# Patient Record
Sex: Male | Born: 1955 | Race: White | Hispanic: No | Marital: Married | Smoking: Never smoker
Health system: Southern US, Community
[De-identification: ages and names within clinical notes are randomized; demographics above are authoritative.]

## PROBLEM LIST (undated history)

## (undated) DIAGNOSIS — T4145XA Adverse effect of unspecified anesthetic, initial encounter: Secondary | ICD-10-CM

## (undated) DIAGNOSIS — T8859XA Other complications of anesthesia, initial encounter: Secondary | ICD-10-CM

## (undated) DIAGNOSIS — I639 Cerebral infarction, unspecified: Secondary | ICD-10-CM

## (undated) DIAGNOSIS — Z87442 Personal history of urinary calculi: Secondary | ICD-10-CM

## (undated) DIAGNOSIS — I10 Essential (primary) hypertension: Secondary | ICD-10-CM

## (undated) DIAGNOSIS — K219 Gastro-esophageal reflux disease without esophagitis: Secondary | ICD-10-CM

## (undated) DIAGNOSIS — Z8719 Personal history of other diseases of the digestive system: Secondary | ICD-10-CM

## (undated) DIAGNOSIS — I6521 Occlusion and stenosis of right carotid artery: Secondary | ICD-10-CM

## (undated) DIAGNOSIS — E785 Hyperlipidemia, unspecified: Secondary | ICD-10-CM

## (undated) DIAGNOSIS — E669 Obesity, unspecified: Secondary | ICD-10-CM

## (undated) HISTORY — DX: Obstructive sleep apnea (adult) (pediatric): G47.33

## (undated) HISTORY — PX: HEMORRHOID SURGERY: SHX153

## (undated) HISTORY — DX: Essential (primary) hypertension: I10

## (undated) HISTORY — PX: COLONOSCOPY: SHX174

## (undated) HISTORY — PX: TONSILLECTOMY: SUR1361

## (undated) HISTORY — DX: Cerebral infarction, unspecified: I63.9

## (undated) HISTORY — DX: Hyperlipidemia, unspecified: E78.5

## (undated) HISTORY — DX: Unspecified atrial fibrillation: I48.91

## (undated) HISTORY — DX: Obesity, unspecified: E66.9

---
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
## 1998-12-27 DIAGNOSIS — I639 Cerebral infarction, unspecified: Secondary | ICD-10-CM

## 1998-12-27 HISTORY — DX: Cerebral infarction, unspecified: I63.9

## 2013-11-14 DIAGNOSIS — R319 Hematuria, unspecified: Secondary | ICD-10-CM | POA: Insufficient documentation

## 2013-12-04 DIAGNOSIS — R399 Unspecified symptoms and signs involving the genitourinary system: Secondary | ICD-10-CM | POA: Insufficient documentation

## 2013-12-04 DIAGNOSIS — N400 Enlarged prostate without lower urinary tract symptoms: Secondary | ICD-10-CM | POA: Insufficient documentation

## 2013-12-04 DIAGNOSIS — N139 Obstructive and reflux uropathy, unspecified: Secondary | ICD-10-CM | POA: Insufficient documentation

## 2014-01-30 ENCOUNTER — Other Ambulatory Visit (HOSPITAL_COMMUNITY): Payer: Self-pay | Admitting: Urology

## 2014-01-30 DIAGNOSIS — N402 Nodular prostate without lower urinary tract symptoms: Secondary | ICD-10-CM

## 2014-02-08 ENCOUNTER — Ambulatory Visit (HOSPITAL_COMMUNITY): Payer: Self-pay

## 2014-09-26 DIAGNOSIS — I10 Essential (primary) hypertension: Secondary | ICD-10-CM | POA: Insufficient documentation

## 2014-11-27 DIAGNOSIS — K2270 Barrett's esophagus without dysplasia: Secondary | ICD-10-CM | POA: Insufficient documentation

## 2015-03-17 ENCOUNTER — Other Ambulatory Visit: Payer: Self-pay | Admitting: Surgery

## 2015-04-07 ENCOUNTER — Telehealth: Payer: Self-pay | Admitting: Cardiovascular Disease

## 2015-04-07 NOTE — Telephone Encounter (Signed)
Records rec [facility] gave to Chart Prep team for 4/20 apt.

## 2015-04-14 ENCOUNTER — Encounter: Payer: Self-pay | Admitting: *Deleted

## 2015-04-15 ENCOUNTER — Encounter: Payer: Self-pay | Admitting: *Deleted

## 2015-04-16 ENCOUNTER — Ambulatory Visit (INDEPENDENT_AMBULATORY_CARE_PROVIDER_SITE_OTHER): Payer: BC Managed Care – PPO | Admitting: Cardiovascular Disease

## 2015-04-16 ENCOUNTER — Encounter: Payer: Self-pay | Admitting: Cardiovascular Disease

## 2015-04-16 VITALS — BP 120/86 | HR 80 | Ht 68.0 in | Wt 210.0 lb

## 2015-04-16 DIAGNOSIS — Z79899 Other long term (current) drug therapy: Secondary | ICD-10-CM | POA: Diagnosis not present

## 2015-04-16 DIAGNOSIS — I482 Chronic atrial fibrillation, unspecified: Secondary | ICD-10-CM

## 2015-04-16 DIAGNOSIS — I639 Cerebral infarction, unspecified: Secondary | ICD-10-CM | POA: Diagnosis not present

## 2015-04-16 MED ORDER — FLECAINIDE ACETATE 50 MG PO TABS: 50.0000 mg | ORAL_TABLET | Freq: Two times a day (BID) | ORAL | Status: DC

## 2015-04-16 NOTE — Progress Notes (Signed)
Cardiology Office Note   Date:  04/16/2015   ID:  Tanner Rich, DOB [birth year], MRN [id number]  PCP:  No primary care provider on file.  Cardiologist:   Charlton HawsPeter Ayleen Mckinstry, MD   No chief complaint on file.     History of Present Illness: Tanner Rich is a 59 y.o. male who presents for evaluation of afib.  History of RICA occlusion and CVA in 2000.  11/2014 noted to have afib.  Started on eliquis and metoprolol  Echo ? Normal with diastolic dysfunction Notes don't indicate size of atria.  Cardioverted in January but reverted in 2-3 days to afib.  Notes indicate would consider AAD.  Also history of OSA HTN, elevated lipids and obesity.  He has documented sleep apnea over 8 years ago but not wearing CPAP.  Denies ETOH.  Feels very fatigued and dyspnic while in afib.    Was not taking eliquis correctly.  Had hemmoroidal bleed when on 10 bid  Still needs GI f/u no colonoscopy done but has had more than one normal one in [place] in last 5 years  No chest pain no history of CAD  Has not had a recent stress test     Past Medical History  Diagnosis Date  . HTN (hypertension)   . HLD (hyperlipidemia)   . OSA (obstructive sleep apnea)   . CVA (cerebral vascular accident)   . A-fib   . Obesity     No past surgical history on file.   Current Outpatient Prescriptions  Medication Sig Dispense Refill  . apixaban (ELIQUIS) 5 MG TABS tablet Take 5 mg by mouth daily.    . enalapril (VASOTEC) 20 MG tablet Take 20 mg by mouth daily.    . metoprolol (LOPRESSOR) 100 MG tablet Take 100 mg by mouth 2 (two) times daily.    . pravastatin (PRAVACHOL) 20 MG tablet Take 20 mg by mouth daily.    Marland Kitchen. PRILOSEC OTC 20 MG tablet Take 20 mg by mouth daily.    . sildenafil (REVATIO) 20 MG tablet Take 20 mg by mouth as needed. FOR SEXUAL ACTIVITY 2-5 TABLETS    . Vitamin D, Ergocalciferol, (DRISDOL) 50000 UNITS CAPS capsule Take 50,000 Units by mouth once a week.     No current facility-administered  medications for this visit.    Allergies:   Review of patient's allergies indicates no known allergies.    Social History:  The patient  reports that he has never smoked. He does not have any smokeless tobacco history on file. He reports that he does not drink alcohol or use illicit drugs.   Family History:  The patient's family history includes Atrial fibrillation in his mother; Cancer in his father; Heart disease in his father; Prostate cancer in an other family member.    ROS:  Please see the history of present illness.   Otherwise, review of systems are positive for none.   All other systems are reviewed and negative.    PHYSICAL EXAM: VS:  BP 120/86 mmHg  Pulse 80  Ht 5\' 8"  (1.727 m)  Wt 210 lb (95.255 kg)  BMI 31.94 kg/m2 , BMI Body mass index is 31.94 kg/(m^2). GEN: Well nourished, well developed, in no acute distress HEENT: normal Neck: no JVD, carotid bruits, or masses Cardiac:  RRR; no murmurs, rubs, or gallops,no edema  Respiratory:  clear to auscultation bilaterally, normal work of breathing GI: soft, nontender, nondistended, + BS MS: no deformity or atrophy Skin: warm and  dry, no rash Neuro:  Strength and sensation are intact Psych: euthymic mood, full affect   EKG:  Afib nonspecifc inferior ST changes QRS 92  QT 384/442  Rate 80     Recent Labs: No results found for requested labs within last 365 days.    Lipid Panel No results found for: CHOL, TRIG, HDL, CHOLHDL, VLDL, LDLCALC, LDLDIRECT    Wt Readings from Last 3 Encounters:  04/16/15 210 lb (95.255 kg)      Other studies Reviewed: Additional studies/ records that were reviewed today include: Over 50 pages of [facility]  See HPO.    ASSESSMENT AND PLAN:  1.  Afib:  Long discussion with patient and wife.  Clearly rate control and anticoagulation not best option as he still feels poorly.  Discussed appropriate dose of eliquis  Scary if he was taking 10 bid.  After bleed only been on 5 mg  daily so we are starting from scratch with 3 weeks of anticoagulaiton.  Rate control is fine  Failed [facility] needs AAD.  EF normal by echo at [facility]  Baseline ECG ok  Start flecainide 50 bid.  Pro arrythmia and need for stress testing discussed.  Willing to start.  Given nonspecific T wave changes will need myovue to r/o CAD and will assess QRS widening after 2 weeks of flecainide.   2. OSA  Discussed connection of PAF with OSA.  Prefer to do repeat [facility] on AAD after he has been on CPAP.  Referred to pulmonary and will likely need sleep study updated as has not been done in over 10 years 3. Chol:  Continue statin given history of stoke  F/u labs primary 4. CVA:  2000 lytics and indicates artery opened ? Thrombotic ICA occlusion f/u caroitd duplex  ASA   Current medicines are reviewed at length with the patient today.  The patient does not have concerns regarding medicines.  The following changes have been made:  Eliquis correct dose 5 bid  And flecainide 5 bid  Labs/ tests ordered today include:  CPAP and sleep referral for OSA  And lexiscan myovue and carotid duplex  No orders of the defined types were placed in this encounter.     Disposition:   FU with me next available       Signed, Charlton Haws, MD  04/16/2015 4:22 PM    [facility] [address] [place], [place], [place]  [address] Phone: [phone]; Fax: [phone]

## 2015-04-16 NOTE — Patient Instructions (Signed)
Medication Instructions:  START  FLECAINIDE   50 MG  1 TAB TWICE  DAILY   Labwork: NONE  Testing/Procedures: Your physician has requested that you have en exercise stress myoview. For further information please visit [email or web address]. Please follow instruction sheet, as given. IN 7 -10 DAYS   Your physician has requested that you have a carotid duplex. This test is an ultrasound of the carotid arteries in your neck. It looks at blood flow through these arteries that supply the brain with blood. Allow one hour for this exam. There are no restrictions or special instructions.   Your physician has recommended that you have a sleep study. This test records several body functions during sleep, including: brain activity, eye movement, oxygen and carbon dioxide blood levels, heart rate and rhythm, breathing rate and rhythm, the flow of air through your mouth and nose, snoring, body muscle movements, and chest and belly movement.  Follow-Up: Your physician recommends that you schedule a follow-up appointment in:  FIRST OF  June  PER  DR Eden EmmsNISHAN   Any Other Special Instructions Will Be Listed Below (If Applicable).

## 2015-04-23 ENCOUNTER — Ambulatory Visit (HOSPITAL_BASED_OUTPATIENT_CLINIC_OR_DEPARTMENT_OTHER): Payer: BC Managed Care – PPO | Admitting: Radiology

## 2015-04-23 ENCOUNTER — Ambulatory Visit (HOSPITAL_COMMUNITY): Payer: BC Managed Care – PPO | Attending: Cardiology | Admitting: *Deleted

## 2015-04-23 DIAGNOSIS — I482 Chronic atrial fibrillation, unspecified: Secondary | ICD-10-CM

## 2015-04-23 DIAGNOSIS — I639 Cerebral infarction, unspecified: Secondary | ICD-10-CM | POA: Diagnosis not present

## 2015-04-23 DIAGNOSIS — R9431 Abnormal electrocardiogram [ECG] [EKG]: Secondary | ICD-10-CM | POA: Insufficient documentation

## 2015-04-23 DIAGNOSIS — R0602 Shortness of breath: Secondary | ICD-10-CM | POA: Diagnosis not present

## 2015-04-23 DIAGNOSIS — R5383 Other fatigue: Secondary | ICD-10-CM | POA: Insufficient documentation

## 2015-04-23 DIAGNOSIS — Z79899 Other long term (current) drug therapy: Secondary | ICD-10-CM

## 2015-04-23 DIAGNOSIS — I6521 Occlusion and stenosis of right carotid artery: Secondary | ICD-10-CM

## 2015-04-23 DIAGNOSIS — I10 Essential (primary) hypertension: Secondary | ICD-10-CM | POA: Insufficient documentation

## 2015-04-23 MED ORDER — TECHNETIUM TC 99M SESTAMIBI GENERIC - CARDIOLITE
11.0000 | Freq: Once | INTRAVENOUS | Status: AC | PRN
  Administered 2015-04-23: 11 via INTRAVENOUS

## 2015-04-23 MED ORDER — TECHNETIUM TC 99M SESTAMIBI GENERIC - CARDIOLITE
33.0000 | Freq: Once | INTRAVENOUS | Status: AC | PRN
  Administered 2015-04-23: 33 via INTRAVENOUS

## 2015-04-23 NOTE — Progress Notes (Signed)
Carotid Duplex Exam Performed 

## 2015-04-23 NOTE — Progress Notes (Signed)
[facility] SITE 3 NUCLEAR MED [address] [place], [place] [address] [phone]    Cardiology Nuclear Med Study  Tanner Rich is a 59 y.o. male     MRN : [id number]     DOB: 09-Mar-1956  Procedure Date: 04/23/2015  Nuclear Med Background Indication for Stress Test:  Evaluation for Ischemia, Abnormal EKG, and assess for QRS widening after recent starting Flecainide History:  No known CAD, Atrial Fibrillation Cardiac Risk Factors: Carotid Disease and Hypertension  Symptoms:  Fatigue and SOB   Nuclear Pre-Procedure Caffeine/Decaff Intake:  6:00am decaffeinated coffee NPO After: 6:00am   Lungs:  clear O2 Sat: 93% on room air. IV 0.9% NS with Angio Cath:  22g  IV Site: R Hand x 1, tolerated well IV Started by:  Irean HongPatsy Kenneth Cuaresma, RN  Chest Size (in):  44 Cup Size: n/a  Height: 5\' 8"  (1.727 m)  Weight:  206 lb (93.441 kg)  BMI:  Body mass index is 31.33 kg/(m^2). Tech Comments:  Patient took Metoprolol last night, and Flecainide this am.Rea Reser, Charity fundraiserN.    Nuclear Med Study 1 or 2 day study: 1 day  Stress Test Type:  Stress  Reading MD: N/A  Order Authorizing Provider:  Charlton HawsPeter Nishan, MD  Resting Radionuclide: Technetium 1038m Sestamibi  Resting Radionuclide Dose: 11.0 mCi   Stress Radionuclide:  Technetium 3638m Sestamibi  Stress Radionuclide Dose: 33.0 mCi           Stress Protocol Rest HR: 62 Stress HR: 150  Rest BP: 129/103 Stress BP: 144/85  Exercise Time (min): 6:00 METS: 7.0   Predicted Max HR: 159 bpm % Max HR: 94.34 bpm Rate Pressure Product: [id number]   Dose of Adenosine (mg):  n/a Dose of Lexiscan: n/a mg  Dose of Atropine (mg): n/a Dose of Dobutamine: n/a mcg/kg/min (at max HR)  Stress Test Technologist: Nelson ChimesSharon Brooks, BS-ES  Nuclear Technologist:  Kerby NoraElzbieta Kubak, CNMT     Rest Procedure:  Myocardial perfusion imaging was performed at rest 45 minutes following the intravenous administration of Technetium 6438m Sestamibi. Rest ECG: Atrial fibrillation.  Incomplete right bundle branch block.  Stress Procedure:  The patient exercised on the treadmill utilizing the Bruce Protocol for 6:00 minutes. The patient stopped due to fatigue and denied any chest pain.  Technetium 1738m Sestamibi was injected at peak exercise and myocardial perfusion imaging was performed after a brief delay. Stress ECG: No significant change from baseline ECG  QPS Raw Data Images:  Normal; no motion artifact; normal heart/lung ratio. Stress Images:  Normal homogeneous uptake in all areas of the myocardium. Rest Images:  Normal homogeneous uptake in all areas of the myocardium. Subtraction (SDS):  No evidence of ischemia. Transient Ischemic Dilatation (Normal <1.22):  0.92 Lung/Heart Ratio (Normal <0.45):  0.30  Quantitative Gated Spect Images QGS EDV:  119 ml QGS ESV:  48 ml  Impression Exercise Capacity:  Fair exercise capacity. BP Response:  Normal blood pressure response. Clinical Symptoms:  No significant symptoms noted. ECG Impression:  No significant ST segment change suggestive of ischemia. Comparison with Prior Nuclear Study: No images to compare  Overall Impression:  Normal stress nuclear study. There is no scar or ischemia. This is a low risk scan.  LV Ejection Fraction: 60%.  LV Wall Motion:  Normal Wall Motion   Willa RoughJeffrey Katz, MD

## 2015-04-29 ENCOUNTER — Ambulatory Visit (HOSPITAL_BASED_OUTPATIENT_CLINIC_OR_DEPARTMENT_OTHER): Payer: BC Managed Care – PPO | Attending: Cardiovascular Disease | Admitting: Radiology

## 2015-04-29 VITALS — Ht 68.0 in | Wt 210.0 lb

## 2015-04-29 DIAGNOSIS — G4733 Obstructive sleep apnea (adult) (pediatric): Secondary | ICD-10-CM

## 2015-04-29 DIAGNOSIS — I4891 Unspecified atrial fibrillation: Secondary | ICD-10-CM | POA: Diagnosis not present

## 2015-04-29 DIAGNOSIS — I482 Chronic atrial fibrillation, unspecified: Secondary | ICD-10-CM

## 2015-04-29 DIAGNOSIS — R0683 Snoring: Secondary | ICD-10-CM | POA: Insufficient documentation

## 2015-04-29 DIAGNOSIS — I493 Ventricular premature depolarization: Secondary | ICD-10-CM | POA: Diagnosis not present

## 2015-05-07 ENCOUNTER — Encounter (HOSPITAL_BASED_OUTPATIENT_CLINIC_OR_DEPARTMENT_OTHER): Payer: Self-pay | Admitting: Radiology

## 2015-05-07 ENCOUNTER — Telehealth: Payer: Self-pay | Admitting: Cardiology

## 2015-05-07 DIAGNOSIS — G4733 Obstructive sleep apnea (adult) (pediatric): Secondary | ICD-10-CM

## 2015-05-07 HISTORY — DX: Obstructive sleep apnea (adult) (pediatric): G47.33

## 2015-05-07 NOTE — Sleep Study (Addendum)
   NAME: Tanner BeardJames W Hermiz DATE OF BIRTH:  Feb 06, 1956 MEDICAL RECORD NUMBER [id number]  LOCATION: [facility]  PHYSICIAN: Koty Anctil R  DATE OF STUDY: 04/29/2015  SLEEP STUDY TYPE: Nocturnal Polysomnogram               REFERRING PHYSICIAN: Wendall StadeNishan, Peter C, MD  INDICATION FOR STUDY: Loud snoring   EPWORTH SLEEPINESS SCORE: 2 HEIGHT: 5\' 8"  (172.7 cm)  WEIGHT: 210 lb (95.255 kg)    Body mass index is 31.94 kg/(m^2).  NECK SIZE: 16.5 in.  MEDICATIONS: Reviewed in the chart  SLEEP ARCHITECTURE: The patient slept for a total of 325 minutes our of a total of 386 minutes.  There was 0.5 minutes of slow wave sleep and 29 minutes of REM sleep.  The onset to sleep latency was 5 minutes and the onset to REM sleep latency was prolonged at 126 minutes.  The sleep efficiency was reduced at 83%.    RESPIRATORY DATA: The patient had a total of 6 apneas, of which, 5 were obstructive and 1 was mixed.  There were 76 hypopneas.  The total AHI was 15.1 events per hour.  This is consistent with moderate obstructive sleep apnea/hypopnea syndrome.  Most events occurred NREM sleep in the non supine position.   There was mild to moderate snoring noted.  OXYGEN DATA: The average oxygen saturation was 94%.  The lowest oxygen saturation was 81%.  The time spent with oxygen saturations <88% was 5 minutes.    CARDIAC DATA: The patient maintained atrial fibrillation throughout the study with PVC's.  The average heart rate was 70 bpm and the lowest heart rate was 35 bpm.  The maximum heart rate was 148 bpm.    MOVEMENT/PARASOMNIA: There were an increased number of periodic limb movements with a PLMS index of 11.6 per hour.  There were no REM sleep behavior disorders.  IMPRESSION/ RECOMMENDATION:   1.   Mild to moderate obstructive sleep apnea/hypopnea syndrome.  Most events occurred NREM sleep in the non supine position. 2.  Reduced sleep efficiency with increased frequency of arousals due to  respiratory and spontaneous events. 3.  Mild to moderate snoring was noted. 4.  Oxygen desaturations as low as 81% were noted with respiratory events.  The time spent with oxygen saturations <88% was 5 minutes.   5.  Abnormal sleep architecture with reduced slow wave and REM sleep. 6.  Periodic limb movements were noted during sleep. 7.  The patient was in atrial fibrillation throughout the study with PVC's. 8.  Given degree of sleep disordered breathing along with significant oxygen desaturations and snoring, recommend proceeding with CPAP titration.  Other options include dental referral for oral device or ENT referral for evaluation of surgical causes of apnea.   Signed: Quintella ReichertURNER,Zeshan Sena R Diplomate, American Board of Sleep Medicine  ELECTRONICALLY SIGNED ON:  05/07/2015, 10:07 AM [facility] PH: [phone]   FX: [phone] ACCREDITED BY THE AMERICAN ACADEMY OF SLEEP MEDICINE

## 2015-05-07 NOTE — Telephone Encounter (Signed)
Please let patient know that they have sleep apnea and recommend CPAP titration. Please set up titration in the sleep lab. 

## 2015-05-09 NOTE — Addendum Note (Signed)
Addended by: Arcola JanskyOOK, Jayvon Mounger M on: 05/09/2015 11:07 AM   Modules accepted: Orders

## 2015-05-09 NOTE — Telephone Encounter (Signed)
Pt is aware of results and agrees with treatment plan.  Orders for CPAP Titration have been put in the system, message sent for scheduling.

## 2015-05-25 NOTE — Progress Notes (Signed)
Cardiology Office Note   Date:  05/28/2015   ID:  Tanner Rich, DOB 02-08-1956, MRN [id number]  PCP:  Marcell Angerandice P Clark, NP  Cardiologist:   Charlton HawsPeter Aveon Colquhoun, MD   No chief complaint on file.     History of Present Illness: Tanner Rich is a 59 y.o. male who presented for evaluation of afib 04/02/15  .  History of RICA occlusion and CVA in 2000.  11/2014 noted to have afib.  Started on eliquis and metoprolol  Echo ? Normal with diastolic dysfunction Notes don't indicate size of atria.  Cardioverted in January but reverted in 2-3 days to afib.  Notes indicate would consider AAD.  Also history of OSA HTN, elevated lipids and obesity.  He has documented sleep apnea over 8 years ago but not wearing CPAP.  Denies ETOH.  Feels very fatigued and dyspnic while in afib.    Was not taking eliquis correctly.  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 HTN (hypertension)   . HLD (hyperlipidemia)   . OSA (obstructive sleep apnea)   . CVA (cerebral vascular accident)   . A-fib   . Obesity   . OSA (obstructive sleep apnea) 05/07/2015    No past surgical history on file.   Current Outpatient Prescriptions  Medication Sig Dispense Refill  . apixaban (ELIQUIS) 5 MG TABS tablet Take 5 mg by mouth daily.    . enalapril (VASOTEC) 20 MG tablet Take 20 mg by mouth daily.    . flecainide (TAMBOCOR) 50 MG tablet Take 1 tablet (50 mg total) by mouth 2 (two) times daily. 180 tablet 3  . metoprolol (LOPRESSOR) 100 MG tablet Take 100 mg by mouth 2 (two) times daily.    . pravastatin (PRAVACHOL) 20 MG tablet Take 20  mg by mouth daily.    Marland Kitchen. PRILOSEC OTC 20 MG tablet Take 20 mg by mouth daily.    . sildenafil (REVATIO) 20 MG tablet Take 20 mg by mouth as needed. FOR SEXUAL ACTIVITY 2-5 TABLETS    . Vitamin D, Ergocalciferol, (DRISDOL) 50000 UNITS CAPS capsule Take 50,000 Units by mouth once a week.     No current facility-administered medications for this visit.    Allergies:   Review of patient's allergies indicates no known allergies.    Social History:  The patient  reports that he has never smoked. He does not have any smokeless tobacco history on file. He reports that he does not drink alcohol or use illicit drugs.   Family History:  The patient's family history includes Atrial fibrillation in his mother; Cancer in his father; Heart disease in his father; Prostate cancer in an other family member.    ROS:  Please see the history of present illness.   Otherwise, review of systems are positive for none.   All other systems are reviewed and negative.    PHYSICAL EXAM: VS:  BP 110/84 mmHg  Pulse 75  Ht 5\' 8"  (1.727 m)  Wt 92.588 kg (204 lb 1.9 oz)  BMI 31.04 kg/m2  SpO2 98% , BMI Body  mass index is 31.04 kg/(m^2). GEN: Well nourished, well developed, in no acute distress HEENT: normal Neck: no JVD, carotid bruits, or masses Cardiac:  RRR; no murmurs, rubs, or gallops,no edema  Respiratory:  clear to auscultation bilaterally, normal work of breathing GI: soft, nontender, nondistended, + BS MS: no deformity or atrophy Skin: warm and dry, no rash Neuro:  Strength and sensation are intact Psych: euthymic mood, full affect   EKG:  04/16/15 Afib nonspecifc inferior ST changes QRS 92  QT 384/442  Rate 80   05/28/15  afib rate 74 QRS 94 [id number]     Recent Labs: No results found for requested labs within last 365 days.    Lipid Panel No results found for: CHOL, TRIG, HDL, CHOLHDL, VLDL, LDLCALC, LDLDIRECT    Wt Readings from Last 3 Encounters:  05/28/15 92.588 kg (204 lb 1.9 oz)  04/29/15  95.255 kg (210 lb)  04/23/15 93.441 kg (206 lb)      Other studies Reviewed: Additional studies/ records that were reviewed today include: Over 50 pages of records from [facility]  See HPO.    ASSESSMENT AND PLAN:  1.  Afib: [facility] set up for Tuesday risks discussed willing to proceed on Rx NOAC.  Labs Monday  2. OSA  Picking CPAP up Sunday  3. Chol:  Continue statin given history of stoke  F/u labs primary 4. CVA:  2000 lytics and indicates artery opened ? Thrombotic ICA occlusion  Left ICA wide open   Current medicines are reviewed at length with the patient today.  The patient does not have concerns regarding medicines.  The following changes have been made:  None  Labs/ tests ordered today include:  Pre [facility] labs [facility] set up for 06/03/15     No orders of the defined types were placed in this encounter.     Disposition:   FU with me post [facility]     Signed, Charlton Haws, MD  05/28/2015 8:52 AM    [facility] [address] [place], [place], [place]  [address] Phone: [phone]; Fax: [phone]

## 2015-05-28 ENCOUNTER — Ambulatory Visit (INDEPENDENT_AMBULATORY_CARE_PROVIDER_SITE_OTHER): Payer: BC Managed Care – PPO | Admitting: Cardiovascular Disease

## 2015-05-28 ENCOUNTER — Encounter: Payer: Self-pay | Admitting: Cardiovascular Disease

## 2015-05-28 ENCOUNTER — Encounter: Payer: Self-pay | Admitting: *Deleted

## 2015-05-28 VITALS — BP 110/84 | HR 75 | Ht 68.0 in | Wt 204.1 lb

## 2015-05-28 DIAGNOSIS — Z01812 Encounter for preprocedural laboratory examination: Secondary | ICD-10-CM

## 2015-05-28 DIAGNOSIS — I481 Persistent atrial fibrillation: Secondary | ICD-10-CM | POA: Diagnosis not present

## 2015-05-28 DIAGNOSIS — I4819 Other persistent atrial fibrillation: Secondary | ICD-10-CM

## 2015-05-28 LAB — CBC WITH DIFFERENTIAL/PLATELET
BASOS ABS: 0 10*3/uL (ref 0.0–0.1)
BASOS PCT: 0.5 % (ref 0.0–3.0)
EOS PCT: 2.6 % (ref 0.0–5.0)
Eosinophils Absolute: 0.2 10*3/uL (ref 0.0–0.7)
HCT: 42 % (ref 39.0–52.0)
Hemoglobin: 13.5 g/dL (ref 13.0–17.0)
LYMPHS PCT: 30.5 % (ref 12.0–46.0)
Lymphs Abs: 2.3 10*3/uL (ref 0.7–4.0)
MCHC: 32.2 g/dL (ref 30.0–36.0)
MCV: 80.6 fl (ref 78.0–100.0)
Monocytes Absolute: 0.6 10*3/uL (ref 0.1–1.0)
Monocytes Relative: 7.9 % (ref 3.0–12.0)
Neutro Abs: 4.5 10*3/uL (ref 1.4–7.7)
Neutrophils Relative %: 58.5 % (ref 43.0–77.0)
Platelets: 278 10*3/uL (ref 150.0–400.0)
RBC: 5.21 Mil/uL (ref 4.22–5.81)
RDW: 15.8 % — AB (ref 11.5–15.5)
WBC: 7.6 10*3/uL (ref 4.0–10.5)

## 2015-05-28 LAB — BASIC METABOLIC PANEL
BUN: 27 mg/dL — ABNORMAL HIGH (ref 6–23)
CALCIUM: 9.9 mg/dL (ref 8.4–10.5)
CO2: 31 mEq/L (ref 19–32)
Chloride: 104 mEq/L (ref 96–112)
Creatinine, Ser: 1.39 mg/dL (ref 0.40–1.50)
GFR: 55.15 mL/min — ABNORMAL LOW (ref >60.00)
Glucose, Bld: 89 mg/dL (ref 70–99)
POTASSIUM: 4.5 meq/L (ref 3.5–5.1)
SODIUM: 139 meq/L (ref 135–145)

## 2015-05-28 NOTE — Patient Instructions (Addendum)
Medication Instructions:  NO CHANGES  Labwork: BMET   CBC   Testing/Procedures: Your physician has recommended that you have a Cardioversion (DCCV). Electrical Cardioversion uses a jolt of electricity to your heart either through paddles or wired patches attached to your chest. This is a controlled, usually prescheduled, procedure. Defibrillation is done under light anesthesia in the hospital, and you usually go home the day of the procedure. This is done to get your heart back into a normal rhythm. You are not awake for the procedure. Please see the instruction sheet given to you today. 06-03-15   Follow-Up: Your physician recommends that you schedule a follow-up appointment in: AFTER  CARDIOVERSION WITH  DR Eden EmmsNISHAN    Any Other Special Instructions Will Be Listed Below (If Applicable).

## 2015-06-01 ENCOUNTER — Ambulatory Visit (HOSPITAL_BASED_OUTPATIENT_CLINIC_OR_DEPARTMENT_OTHER): Payer: BC Managed Care – PPO | Attending: Cardiology

## 2015-06-01 VITALS — Ht 68.0 in | Wt 210.0 lb

## 2015-06-01 DIAGNOSIS — I4891 Unspecified atrial fibrillation: Secondary | ICD-10-CM | POA: Insufficient documentation

## 2015-06-01 DIAGNOSIS — G4733 Obstructive sleep apnea (adult) (pediatric): Secondary | ICD-10-CM | POA: Diagnosis not present

## 2015-06-01 DIAGNOSIS — R0683 Snoring: Secondary | ICD-10-CM | POA: Insufficient documentation

## 2015-06-02 ENCOUNTER — Telehealth: Payer: Self-pay | Admitting: Cardiovascular Disease

## 2015-06-02 NOTE — Telephone Encounter (Signed)
PT'S  WIFE AWARE  OF LAB RESULTS./CY 

## 2015-06-02 NOTE — Telephone Encounter (Signed)
New message ° ° ° ° °Returning Tanner Rich's call °

## 2015-06-03 ENCOUNTER — Encounter (HOSPITAL_COMMUNITY): Payer: Self-pay | Admitting: Certified Registered Nurse Anesthetist

## 2015-06-03 ENCOUNTER — Ambulatory Visit (HOSPITAL_COMMUNITY): Payer: BC Managed Care – PPO | Admitting: Certified Registered Nurse Anesthetist

## 2015-06-03 ENCOUNTER — Ambulatory Visit (HOSPITAL_COMMUNITY)
Admission: RE | Admit: 2015-06-03 | Discharge: 2015-06-03 | Disposition: A | Discharge status: Home / Self Care | Payer: BC Managed Care – PPO | Source: Ambulatory Visit | Attending: Cardiovascular Disease | Admitting: Cardiovascular Disease

## 2015-06-03 ENCOUNTER — Encounter (HOSPITAL_COMMUNITY)
Admission: RE | Disposition: A | Discharge status: Home / Self Care | Payer: Self-pay | Source: Ambulatory Visit | Attending: Cardiovascular Disease

## 2015-06-03 DIAGNOSIS — I4891 Unspecified atrial fibrillation: Secondary | ICD-10-CM | POA: Insufficient documentation

## 2015-06-03 DIAGNOSIS — G4730 Sleep apnea, unspecified: Secondary | ICD-10-CM | POA: Insufficient documentation

## 2015-06-03 DIAGNOSIS — Z7901 Long term (current) use of anticoagulants: Secondary | ICD-10-CM | POA: Diagnosis not present

## 2015-06-03 DIAGNOSIS — E785 Hyperlipidemia, unspecified: Secondary | ICD-10-CM | POA: Insufficient documentation

## 2015-06-03 DIAGNOSIS — Z8673 Personal history of transient ischemic attack (TIA), and cerebral infarction without residual deficits: Secondary | ICD-10-CM | POA: Insufficient documentation

## 2015-06-03 DIAGNOSIS — Z6831 Body mass index (BMI) 31.0-31.9, adult: Secondary | ICD-10-CM | POA: Diagnosis not present

## 2015-06-03 DIAGNOSIS — E669 Obesity, unspecified: Secondary | ICD-10-CM | POA: Insufficient documentation

## 2015-06-03 HISTORY — PX: CARDIOVERSION: SHX1299

## 2015-06-03 HISTORY — DX: Gastro-esophageal reflux disease without esophagitis: K21.9

## 2015-06-03 SURGERY — CARDIOVERSION
Anesthesia: General

## 2015-06-03 MED ORDER — PROPOFOL 10 MG/ML IV BOLUS
INTRAVENOUS | Status: DC | PRN
  Administered 2015-06-03: 50 mg via INTRAVENOUS

## 2015-06-03 MED ORDER — LIDOCAINE HCL (CARDIAC) 20 MG/ML IV SOLN
INTRAVENOUS | Status: DC | PRN
  Administered 2015-06-03: 40 mg via INTRAVENOUS

## 2015-06-03 NOTE — Anesthesia Preprocedure Evaluation (Signed)
Anesthesia Evaluation  Patient identified by MRN, date of birth, ID band Patient awake    Reviewed: Allergy & Precautions, NPO status , Patient's Chart, lab work & pertinent test results  History of Anesthesia Complications Negative for: history of anesthetic complications  Airway Mallampati: III  TM Distance: >3 FB Neck ROM: Full    Dental  (+) Teeth Intact   Pulmonary neg shortness of breath, sleep apnea , neg COPDneg recent URI, neg PE breath sounds clear to auscultation        Cardiovascular hypertension, Pt. on medications and Pt. on home beta blockers Rhythm:Irregular     Neuro/Psych negative neurological ROS  negative psych ROS   GI/Hepatic Neg liver ROS, GERD-  Controlled and Medicated,  Endo/Other  negative endocrine ROS  Renal/GU negative Renal ROS     Musculoskeletal   Abdominal   Peds  Hematology negative hematology ROS (+)   Anesthesia Other Findings   Reproductive/Obstetrics                             Anesthesia Physical Anesthesia Plan  ASA: III  Anesthesia Plan: General   Post-op Pain Management:    Induction: Intravenous  Airway Management Planned: Mask  Additional Equipment: None  Intra-op Plan:   Post-operative Plan:   Informed Consent: I have reviewed the patients History and Physical, chart, labs and discussed the procedure including the risks, benefits and alternatives for the proposed anesthesia with the patient or authorized representative who has indicated his/her understanding and acceptance.   Dental advisory given  Plan Discussed with: CRNA and Surgeon  Anesthesia Plan Comments:         Anesthesia Quick Evaluation

## 2015-06-03 NOTE — Transfer of Care (Signed)
Immediate Anesthesia Transfer of Care Note  Patient: Tanner Rich  Procedure(s) Performed: Procedure(s): CARDIOVERSION (N/A)  Patient Location: Endoscopy Unit  Anesthesia Type:General  Level of Consciousness: awake, alert  and oriented  Airway & Oxygen Therapy: Patient Spontanous Breathing  Post-op Assessment: Report given to RN and Post -op Vital signs reviewed and stable  Post vital signs: Reviewed and stable  Last Vitals:  Filed Vitals:   06/03/15 0920  BP: 115/81  Temp: 36.9 C  Resp: 17    Complications: No apparent anesthesia complications

## 2015-06-03 NOTE — H&P (View-Only) (Signed)
  Cardiology Office Note   Date:  05/28/2015   ID:  Tanner Rich, DOB 07/06/1956, MRN [id number]  PCP:  Candice P Clark, NP  Cardiologist:   Cheynne Virden, MD   No chief complaint on file.     History of Present Illness: Tanner Rich is a 59 y.o. male who presented for evaluation of afib 04/02/15  .  History of RICA occlusion and CVA in 2000.  11/2014 noted to have afib.  Started on eliquis and metoprolol  Echo ? Normal with diastolic dysfunction Notes don't indicate size of atria.  Cardioverted in January but reverted in 2-3 days to afib.  Notes indicate would consider AAD.  Also history of OSA HTN, elevated lipids and obesity.  He has documented sleep apnea over 8 years ago but not wearing CPAP.  Denies ETOH.  Feels very fatigued and dyspnic while in afib.    Was not taking eliquis correctly.  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 HTN (hypertension)   . HLD (hyperlipidemia)   . OSA (obstructive sleep apnea)   . CVA (cerebral vascular accident)   . A-fib   . Obesity   . OSA (obstructive sleep apnea) 05/07/2015    No past surgical history on file.   Current Outpatient Prescriptions  Medication Sig Dispense Refill  . apixaban (ELIQUIS) 5 MG TABS tablet Take 5 mg by mouth daily.    . enalapril (VASOTEC) 20 MG tablet Take 20 mg by mouth daily.    . flecainide (TAMBOCOR) 50 MG tablet Take 1 tablet (50 mg total) by mouth 2 (two) times daily. 180 tablet 3  . metoprolol (LOPRESSOR) 100 MG tablet Take 100 mg by mouth 2 (two) times daily.    . pravastatin (PRAVACHOL) 20 MG tablet Take 20  mg by mouth daily.    . PRILOSEC OTC 20 MG tablet Take 20 mg by mouth daily.    . sildenafil (REVATIO) 20 MG tablet Take 20 mg by mouth as needed. FOR SEXUAL ACTIVITY 2-5 TABLETS    . Vitamin D, Ergocalciferol, (DRISDOL) 50000 UNITS CAPS capsule Take 50,000 Units by mouth once a week.     No current facility-administered medications for this visit.    Allergies:   Review of patient's allergies indicates no known allergies.    Social History:  The patient  reports that he has never smoked. He does not have any smokeless tobacco history on file. He reports that he does not drink alcohol or use illicit drugs.   Family History:  The patient's family history includes Atrial fibrillation in his mother; Cancer in his father; Heart disease in his father; Prostate cancer in an other family member.    ROS:  Please see the history of present illness.   Otherwise, review of systems are positive for none.   All other systems are reviewed and negative.    PHYSICAL EXAM: VS:  BP 110/84 mmHg  Pulse 75  Ht 5' 8" (1.727 m)  Wt 92.588 kg (204 lb 1.9 oz)  BMI 31.04 kg/m2  SpO2 98% , BMI Body   mass index is 31.04 kg/(m^2). GEN: Well nourished, well developed, in no acute distress HEENT: normal Neck: no JVD, carotid bruits, or masses Cardiac:  RRR; no murmurs, rubs, or gallops,no edema  Respiratory:  clear to auscultation bilaterally, normal work of breathing GI: soft, nontender, nondistended, + BS MS: no deformity or atrophy Skin: warm and dry, no rash Neuro:  Strength and sensation are intact Psych: euthymic mood, full affect   EKG:  04/16/15 Afib nonspecifc inferior ST changes QRS 92  QT 384/442  Rate 80   05/28/15  afib rate 74 QRS 94 ICRBBB     Recent Labs: No results found for requested labs within last 365 days.    Lipid Panel No results found for: CHOL, TRIG, HDL, CHOLHDL, VLDL, LDLCALC, LDLDIRECT    Wt Readings from Last 3 Encounters:  05/28/15 92.588 kg (204 lb 1.9 oz)  04/29/15  95.255 kg (210 lb)  04/23/15 93.441 kg (206 lb)      Other studies Reviewed: Additional studies/ records that were reviewed today include: Over 50 pages of records from [facility]  See HPO.    ASSESSMENT AND PLAN:  1.  Afib: DCC set up for Tuesday risks discussed willing to proceed on Rx NOAC.  Labs Monday  2. OSA  Picking CPAP up Sunday  3. Chol:  Continue statin given history of stoke  F/u labs primary 4. CVA:  2000 lytics and indicates artery opened ? Thrombotic ICA occlusion  Left ICA wide open   Current medicines are reviewed at length with the patient today.  The patient does not have concerns regarding medicines.  The following changes have been made:  None  Labs/ tests ordered today include:  Pre DCC labs DCC set up for 06/03/15     No orders of the defined types were placed in this encounter.     Disposition:   FU with me post DCC     Signed, Stormie Ventola, MD  05/28/2015 8:52 AM    [facility] [address], [place], [place]  [address] Phone: [phone]; Fax: [phone]   

## 2015-06-03 NOTE — Discharge Instructions (Signed)

## 2015-06-03 NOTE — Interval H&P Note (Signed)
History and Physical Interval Note:  06/03/2015 10:17 AM  Tanner Rich  has presented today for surgery, with the diagnosis of afib  The various methods of treatment have been discussed with the patient and family. After consideration of risks, benefits and other options for treatment, the patient has consented to  Procedure(s): CARDIOVERSION (N/A) as a surgical intervention .  The patient's history has been reviewed, patient examined, no change in status, stable for surgery.  I have reviewed the patient's chart and labs.  Questions were answered to the patient's satisfaction.     Charlton HawsPeter Quayshaun Hubbert

## 2015-06-03 NOTE — Anesthesia Postprocedure Evaluation (Signed)
  Anesthesia Post-op Note  Patient: Tanner Rich  Procedure(s) Performed: Procedure(s): CARDIOVERSION (N/A)  Patient Location: Endoscopy Unit  Anesthesia Type:General  Level of Consciousness: awake, alert  and oriented  Airway and Oxygen Therapy: Patient Spontanous Breathing  Post-op Pain: none  Post-op Assessment: Post-op Vital signs reviewed, Patient's Cardiovascular Status Stable, Respiratory Function Stable, Patent Airway and No signs of Nausea or vomiting  Post-op Vital Signs: Reviewed and stable  Last Vitals:  Filed Vitals:   06/03/15 0920  BP: 115/81  Temp: 36.9 C  Resp: 17    Complications: No apparent anesthesia complications

## 2015-06-03 NOTE — Anesthesia Postprocedure Evaluation (Signed)
  Anesthesia Post-op Note  Patient: Iona BeardJames W Higley  Procedure(s) Performed: Procedure(s): CARDIOVERSION (N/A)  Patient Location: Endoscopy Unit  Anesthesia Type:General  Level of Consciousness: awake  Airway and Oxygen Therapy: Patient Spontanous Breathing  Post-op Pain: none  Post-op Assessment: Post-op Vital signs reviewed, Patient's Cardiovascular Status Stable, Respiratory Function Stable, Patent Airway, No signs of Nausea or vomiting and Pain level controlled  Post-op Vital Signs: Reviewed and stable  Last Vitals:  Filed Vitals:   06/03/15 1030  BP: 107/72  Pulse: 56  Temp:   Resp: 15    Complications: No apparent anesthesia complications

## 2015-06-03 NOTE — CV Procedure (Signed)
DCC:  Propofol  40mg  Lidocaine 50 mg On Rx anticoagulation DCC x1 120 Joules Converted to NSR rate 70 No immediate neurologic sequelae  Charlton HawsPeter Anneka Studer

## 2015-06-05 ENCOUNTER — Encounter (HOSPITAL_COMMUNITY): Payer: Self-pay | Admitting: Cardiovascular Disease

## 2015-06-14 ENCOUNTER — Telehealth: Payer: Self-pay | Admitting: Cardiology

## 2015-06-14 NOTE — Addendum Note (Signed)
Addended by: Armanda Magic R on: 06/14/2015 03:04 PM   Modules accepted: Orders

## 2015-06-14 NOTE — Telephone Encounter (Signed)
Pt had successful PAP titration. Please setup appointment in 10 weeks. Please let [facility] know that order for PAP is in EPIC.   

## 2015-06-14 NOTE — Sleep Study (Addendum)
   NAME: Tanner Rich DATE OF BIRTH:  1956-11-19 MEDICAL RECORD NUMBER [id number]  LOCATION: [facility]  PHYSICIAN: Katheryne Gorr R  DATE OF STUDY: 06/01/2015  SLEEP STUDY TYPE: Positive Airway Pressure Titration               REFERRING PHYSICIAN: Quintella Reichert, MD  INDICATION FOR STUDY: Mild to moderate bstructive sleep apnea with an AHI of 15.1 events per hour.  EPWORTH SLEEPINESS SCORE: 2 HEIGHT: 5\' 8"  (172.7 cm)  WEIGHT: 210 lb (95.255 kg)    Body mass index is 31.94 kg/(m^2).  NECK SIZE: 16.5 in.  MEDICATIONS: Reviewed in the chart  SLEEP ARCHITECTURE: The patient slept for a total of 132 minutes out of a total sleep period time of 319 minutes.  There was no slow wave sleep and 7 minutes of REM sleep.  The onset to sleep latency was prolonged at 50 minutes.  The onset to REM sleep latency was prolonged at 204 minutes.  The sleep efficiency was reduced at 36%.    RESPIRATORY DATA: The patient was started on CPAP at 5cm H2O.  He was able to sleep for 132 minutes at this pressure and 7 minutes of REM sleep without further respiratory events.  The AHI was 0.5 events per hour. There was minimal snoring noted.  OXYGEN DATA: The average oxygen saturation was 94%.  The lowest oxygen saturation was 91%.    CARDIAC DATA: The patient maintained atrial fibrillation throughout the study.  The average heart rate was 74 bpm.  The lowest heart rate was 30 bpm.    MOVEMENT/PARASOMNIA: There were an increased frequency of limb movement disorders with a PLMS index of 9.5 movements per hour.    IMPRESSION/ RECOMMENDATION:   1.  Successful CPAP titration to 5cm H2O.  The AHI was 0.5 events per hour. 2.  Minimal snoring was noted. 3.  The lowest oxygen saturation was 91%.   4.  The patient maintained atrial fibrillation throughout the study. 4.  Recommend ResMed CPAP with heated humidity and small ResMed P-10 Nasal pillow mask with chin strap set at 5cm H2O.   5.    Treatment would also include careful attention to proper sleep hygiene, weight reduction if the BMI is elevated, avoidance of sleeping in the supine position and avoidance of alcohol within four hours of bedtime.  Specific treatment decisions should be tailored to each patient based upon the clinical situation and all treatment options should be considered.  The patient should be instructed to avoid driving if sleepy and careful clinical follow up is needed to ensure that the patient's symptoms are improving with therapy and the PAP adherence is supported and measured if prescribed.     Signed: Quintella Reichert Diplomate, American Board of Sleep Medicine  ELECTRONICALLY SIGNED ON:  06/14/2015, 2:55 PM [facility] PH: [phone]   FX: [phone] ACCREDITED BY THE AMERICAN ACADEMY OF SLEEP MEDICINE

## 2015-06-16 NOTE — Telephone Encounter (Signed)
Patient is aware.   [facility] notified. Once patient gets his machine, I will schedule 10 week follow-up appt.

## 2015-06-25 ENCOUNTER — Encounter (HOSPITAL_BASED_OUTPATIENT_CLINIC_OR_DEPARTMENT_OTHER): Payer: BC Managed Care – PPO

## 2015-08-13 ENCOUNTER — Ambulatory Visit: Payer: BC Managed Care – PPO | Admitting: Cardiovascular Disease

## 2015-08-19 NOTE — Progress Notes (Signed)
Patient ID: IRISH PIECH, male   DOB: 1956/06/17, 59 y.o.   MRN: [id number]     Cardiology Office Note   Date:  08/19/2015   ID:  Demerius, Podolak [birth year], MRN [id number]  PCP:  Marcell Anger, NP  Cardiologist:   Charlton Haws, MD   No chief complaint on file.     History of Present Illness: BRYAN GOIN is a 59 y.o. male who presented for evaluation of afib 04/02/15  .  History of RICA occlusion and CVA in 2000.  11/2014 noted to have afib.  Started on eliquis and metoprolol  Echo ? Normal with diastolic dysfunction Notes don't indicate size of atria.  Cardioverted in January but reverted in 2-3 days to afib.  Notes indicate would consider AAD.  Also history of OSA HTN, elevated lipids and obesity.  He has documented sleep apnea over 8 years ago but not wearing CPAP.  Denies ETOH.  Feels very fatigued and dyspnic while in afib.    Was not taking eliquis correctly.  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 HTN (hypertension)   . HLD (hyperlipidemia)   . OSA (obstructive sleep apnea)   . CVA (cerebral vascular accident)   . A-fib   . Obesity   . OSA (obstructive sleep apnea) 05/07/2015  . GERD (gastroesophageal reflux disease)     Past Surgical History  Procedure Laterality Date  . Tonsillectomy    . Cardioversion N/A 06/03/2015    Procedure: CARDIOVERSION;  Surgeon: Wendall Stade, MD;  Location: [facility] ENDOSCOPY;  Service: Cardiovascular;  Laterality: N/A;     Current Outpatient Prescriptions  Medication Sig Dispense Refill  . apixaban (ELIQUIS) 5 MG TABS tablet Take 5 mg by mouth daily.    . enalapril (VASOTEC) 20 MG tablet Take 20  mg by mouth daily.    . flecainide (TAMBOCOR) 50 MG tablet Take 1 tablet (50 mg total) by mouth 2 (two) times daily. 180 tablet 3  . pravastatin (PRAVACHOL) 20 MG tablet Take 20 mg by mouth daily.    Marland Kitchen PRILOSEC OTC 20 MG tablet Take 20 mg by mouth daily.    . sildenafil (REVATIO) 20 MG tablet Take 20 mg by mouth as needed. FOR SEXUAL ACTIVITY 2-5 TABLETS    . Vitamin D, Ergocalciferol, (DRISDOL) 50000 UNITS CAPS capsule Take 50,000 Units by mouth once a week.     No current facility-administered medications for this visit.    Allergies:   Review of patient's allergies indicates no known allergies.    Social History:  The patient  reports that he has never smoked. He does not have any smokeless tobacco history on file. He reports that he does not drink alcohol or use illicit drugs.   Family History:  The patient's family history includes Atrial fibrillation in his mother; Cancer in his father; Heart disease in his father; Prostate cancer in an other family member.    ROS:  Please see the history of present illness.   Otherwise, review of systems are positive for none.   All other systems are reviewed and negative.  PHYSICAL EXAM: VS:  There were no vitals taken for this visit. , BMI There is no weight on file to calculate BMI. GEN: Well nourished, well developed, in no acute distress HEENT: normal Neck: no JVD, carotid bruits, or masses Cardiac:  RRR; no murmurs, rubs, or gallops,no edema  Respiratory:  clear to auscultation bilaterally, normal work of breathing GI: soft, nontender, nondistended, + BS MS: no deformity or atrophy Skin: warm and dry, no rash Neuro:  Strength and sensation are intact Psych: euthymic mood, full affect   EKG:  04/16/15 Afib nonspecifc inferior ST changes QRS 92  QT 384/442  Rate 80   05/28/15  afib rate 74 QRS 94 ICRBBB   08/21/15 SB rate 55  ICRBBB  NSR compared to June    Recent Labs: 05/28/2015: BUN 27*; Creatinine, Ser 1.39; Hemoglobin 13.5;  Platelets 278.0; Potassium 4.5; Sodium 139    Lipid Panel No results found for: CHOL, TRIG, HDL, CHOLHDL, VLDL, LDLCALC, LDLDIRECT    Wt Readings from Last 3 Encounters:  06/03/15 95.255 kg (210 lb)  06/01/15 95.255 kg (210 lb)  05/28/15 92.588 kg (204 lb 1.9 oz)      Other studies Reviewed: Additional studies/ records that were reviewed today include: Over 50 pages of records from [facility]  See HPO.    ASSESSMENT AND PLAN:  1.  Afib: Post repeat [facility] 06/03/15 successful  2. OSA  Continue CPAP f/u Dr Mayford Knife  3. Chol:  Continue statin given history of stoke  F/u labs primary 4. CVA:  2000 lytics and indicates artery opened ? Thrombotic ICA occlusion  Left ICA wide open   Current medicines are reviewed at length with the patient today.  The patient does not have concerns regarding medicines.  The following changes have been made:  None  Labs/ tests ordered today include:  Pre [facility] labs [facility] set up for 06/03/15     No orders of the defined types were placed in this encounter.     Disposition:   FU with me 6 months    Signed, Charlton Haws, MD  08/19/2015 12:03 PM    [facility] [address] [place], [place], [place]  [address] Phone: [phone]; Fax: [phone]

## 2015-08-21 ENCOUNTER — Ambulatory Visit (INDEPENDENT_AMBULATORY_CARE_PROVIDER_SITE_OTHER): Payer: BC Managed Care – PPO | Admitting: Cardiovascular Disease

## 2015-08-21 ENCOUNTER — Encounter: Payer: Self-pay | Admitting: Cardiovascular Disease

## 2015-08-21 VITALS — BP 120/80 | HR 55 | Ht 68.0 in | Wt 207.8 lb

## 2015-08-21 DIAGNOSIS — I4819 Other persistent atrial fibrillation: Secondary | ICD-10-CM

## 2015-08-21 DIAGNOSIS — I481 Persistent atrial fibrillation: Secondary | ICD-10-CM | POA: Diagnosis not present

## 2015-08-21 NOTE — Patient Instructions (Signed)
Medication Instructions:  NO CHANGES  Labwork: NONE  Testing/Procedures: NONE  Follow-Up: Your physician wants you to follow-up in: 6 MONTHS WITH DR NISHAN You will receive a reminder letter in the mail two months in advance. If you don't receive a letter, please call our office to schedule the follow-up appointment.  Any Other Special Instructions Will Be Listed Below (If Applicable).   

## 2015-08-28 ENCOUNTER — Encounter: Payer: Self-pay | Admitting: Cardiology

## 2015-09-17 ENCOUNTER — Encounter: Payer: Self-pay | Admitting: Cardiology

## 2015-10-01 ENCOUNTER — Ambulatory Visit: Payer: BC Managed Care – PPO | Admitting: Cardiology

## 2015-10-16 ENCOUNTER — Ambulatory Visit: Payer: BC Managed Care – PPO | Admitting: Cardiology

## 2015-10-31 ENCOUNTER — Encounter: Payer: Self-pay | Admitting: Cardiology

## 2016-04-09 ENCOUNTER — Other Ambulatory Visit: Payer: Self-pay | Admitting: Cardiovascular Disease

## 2016-04-09 DIAGNOSIS — I6523 Occlusion and stenosis of bilateral carotid arteries: Secondary | ICD-10-CM

## 2016-04-27 ENCOUNTER — Ambulatory Visit (HOSPITAL_COMMUNITY)
Admission: RE | Admit: 2016-04-27 | Discharge: 2016-04-27 | Disposition: A | Discharge status: Home / Self Care | Payer: BC Managed Care – PPO | Source: Ambulatory Visit | Attending: Cardiology | Admitting: Cardiology

## 2016-04-27 DIAGNOSIS — I10 Essential (primary) hypertension: Secondary | ICD-10-CM | POA: Insufficient documentation

## 2016-04-27 DIAGNOSIS — G4733 Obstructive sleep apnea (adult) (pediatric): Secondary | ICD-10-CM | POA: Diagnosis not present

## 2016-04-27 DIAGNOSIS — K219 Gastro-esophageal reflux disease without esophagitis: Secondary | ICD-10-CM | POA: Insufficient documentation

## 2016-04-27 DIAGNOSIS — I6521 Occlusion and stenosis of right carotid artery: Secondary | ICD-10-CM | POA: Diagnosis not present

## 2016-04-27 DIAGNOSIS — E785 Hyperlipidemia, unspecified: Secondary | ICD-10-CM | POA: Insufficient documentation

## 2016-04-27 DIAGNOSIS — I6523 Occlusion and stenosis of bilateral carotid arteries: Secondary | ICD-10-CM

## 2016-07-13 ENCOUNTER — Other Ambulatory Visit: Payer: Self-pay

## 2016-07-13 MED ORDER — FLECAINIDE ACETATE 50 MG PO TABS: 50.0000 mg | ORAL_TABLET | Freq: Two times a day (BID) | ORAL | Status: DC

## 2016-07-13 NOTE — Telephone Encounter (Signed)
Tanner StadePeter C Nishan, MD at 08/19/2015 12:03 PM  flecainide (TAMBOCOR) 50 MG tabletTake 1 tablet (50 mg total) by mouth 2 (two) times daily Current medicines are reviewed at length with the patient today. The patient does not have concerns regarding medicines.  The following changes have been made: None

## 2016-08-23 ENCOUNTER — Other Ambulatory Visit: Payer: Self-pay | Admitting: *Deleted

## 2016-08-23 MED ORDER — FLECAINIDE ACETATE 50 MG PO TABS: 50.0000 mg | ORAL_TABLET | Freq: Two times a day (BID) | ORAL | 0 refills | Status: DC

## 2016-10-05 ENCOUNTER — Other Ambulatory Visit: Payer: Self-pay | Admitting: Cardiovascular Disease

## 2016-10-05 MED ORDER — FLECAINIDE ACETATE 50 MG PO TABS: 50.0000 mg | ORAL_TABLET | Freq: Two times a day (BID) | ORAL | 0 refills | Status: DC

## 2016-11-05 ENCOUNTER — Other Ambulatory Visit: Payer: Self-pay | Admitting: *Deleted

## 2016-11-05 MED ORDER — FLECAINIDE ACETATE 50 MG PO TABS: 50.0000 mg | ORAL_TABLET | Freq: Two times a day (BID) | ORAL | 1 refills | Status: DC

## 2016-12-29 NOTE — Progress Notes (Signed)
Patient ID: Tanner Rich, male   DOB: [birth year], 61 y.o.   MRN: [id number]     Cardiology Office Note   Date:  01/03/2017   ID:  Tanner Rich, DOB [birth year], MRN [id number]  PCP:  Marcell Angerandice P Clark, NP  Cardiologist:   Charlton HawsPeter Hilman Kissling, MD   Chief Complaint  Patient presents with  . 1 yeat f/u      History of Present Illness: Tanner Rich is a 61 y.o. male who presented for evaluation of afib 04/02/15  .  History of RICA occlusion and CVA in 2000.  11/2014 noted to have afib.  Started on eliquis and metoprolol  Echo ? Normal with diastolic dysfunction Notes don't indicate size of atria.  Cardioverted in January but reverted in 2-3 days to afib.  Notes indicate would consider AAD.  Also history of OSA HTN, elevated lipids and obesity.  He has documented sleep apnea over 8 years ago but not wearing CPAP.  Denies ETOH.  Feels very fatigued and dyspnic while in afib.    Was not taking eliquis correctly.  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 A-fib (HCC)   . CVA (cerebral vascular accident) (HCC)   . GERD (gastroesophageal reflux disease)   . HLD (hyperlipidemia)   . HTN (hypertension)   . Obesity   . OSA (obstructive sleep apnea)   . OSA (obstructive sleep apnea) 05/07/2015    Past Surgical History:  Procedure Laterality Date  . CARDIOVERSION N/A 06/03/2015   Procedure: CARDIOVERSION;  Surgeon: Wendall StadePeter C Alyaan Budzynski, MD;  [facility];  Service: Cardiovascular;  Laterality: N/A;  . TONSILLECTOMY       Current Outpatient Prescriptions  Medication Sig Dispense Refill  .  apixaban (ELIQUIS) 5 MG TABS tablet Take 5 mg by mouth daily.    Marland Kitchen. escitalopram (LEXAPRO) 5 MG tablet Take 5 mg by mouth daily.    . flecainide (TAMBOCOR) 50 MG tablet Take 1 tablet (50 mg total) by mouth 2 (two) times daily. Please keep 01/04/16 appointment for further refills 60 tablet 1  . PRILOSEC OTC 20 MG tablet Take 20 mg by mouth daily.    . enalapril (VASOTEC) 20 MG tablet Take 20 mg by mouth daily.    . pravastatin (PRAVACHOL) 20 MG tablet Take 20 mg by mouth daily.     No current facility-administered medications for this visit.     Allergies:   Patient has no known allergies.    Social History:  The patient  reports that he has never smoked. He does not have any smokeless tobacco history on file. He reports that he does not drink alcohol or use drugs.   Family History:  The patient's family history includes Atrial fibrillation in his mother; Cancer in his father; Heart disease in his father.    ROS:  Please see the history of present illness.   Otherwise, review of systems are positive for none.   All other systems are reviewed and negative.    PHYSICAL EXAM: VS:  BP 136/82   Pulse 60  Ht 5\' 6"  (1.676 m)   Wt 209 lb (94.8 kg)   SpO2 98%   BMI 33.73 kg/m  , BMI Body mass index is 33.73 kg/m. GEN: Well nourished, well developed, in no acute distress HEENT: normal Neck: no JVD, carotid bruits, or masses Cardiac:  RRR; no murmurs, rubs, or gallops,no edema  Respiratory:  clear to auscultation bilaterally, normal work of breathing GI: soft, nontender, nondistended, + BS MS: no deformity or atrophy Skin: warm and dry, no rash Neuro:  Strength and sensation are intact Psych: euthymic mood, full affect   EKG:  04/16/15 Afib nonspecifc inferior ST changes QRS 92  QT 384/442  Rate 80   05/28/15  afib rate 74 QRS 94 ICRBBB   08/21/15 SB rate 55  ICRBBB  NSR compared to June  01/03/17 SR rate 56 PVC ICRBBB T wave inversions 3,F    Recent Labs: No results found for requested  labs within last 8760 hours.    Lipid Panel No results found for: CHOL, TRIG, HDL, CHOLHDL, VLDL, LDLCALC, LDLDIRECT    Wt Readings from Last 3 Encounters:  01/03/17 209 lb (94.8 kg)  08/21/15 207 lb 12.8 oz (94.3 kg)  06/03/15 210 lb (95.3 kg)      Other studies Reviewed: Additional studies/ records that were reviewed today include: Over 50 pages of records from [facility]  See HPO.    ASSESSMENT AND PLAN:  1.  Afib: Post repeat [facility] 06/03/15 successful told to increase flecainide to 100 bid if goes out of rhythm 2. OSA  Continue CPAP f/u Dr Mayford Knife  3. Chol:  Continue statin given history of stoke  F/u labs primary 4. CVA:  2000 lytics and indicates artery opened ? Thrombotic ICA occlusion  Left ICA wide open F/u duplex next visit July 2018    Current medicines are reviewed at length with the patient today.  The patient does not have concerns regarding medicines.  The following changes have been made:  None  Labs/ tests ordered today include:  None     Orders Placed This Encounter  Procedures  . EKG 12-Lead     Disposition:   FU with me 6 months    Signed, Charlton Haws, MD  01/03/2017 8:28 AM    [facility] [address] [place], [place], [place]  [address] Phone: [phone]; Fax: [phone]

## 2017-01-03 ENCOUNTER — Ambulatory Visit (INDEPENDENT_AMBULATORY_CARE_PROVIDER_SITE_OTHER): Payer: BC Managed Care – PPO | Admitting: Cardiovascular Disease

## 2017-01-03 ENCOUNTER — Encounter (INDEPENDENT_AMBULATORY_CARE_PROVIDER_SITE_OTHER): Payer: Self-pay

## 2017-01-03 ENCOUNTER — Encounter: Payer: Self-pay | Admitting: Cardiovascular Disease

## 2017-01-03 VITALS — BP 136/82 | HR 60 | Ht 66.0 in | Wt 209.0 lb

## 2017-01-03 DIAGNOSIS — I6523 Occlusion and stenosis of bilateral carotid arteries: Secondary | ICD-10-CM

## 2017-01-03 NOTE — Patient Instructions (Signed)

## 2017-01-06 ENCOUNTER — Other Ambulatory Visit: Payer: Self-pay | Admitting: *Deleted

## 2017-01-06 MED ORDER — FLECAINIDE ACETATE 50 MG PO TABS: 50.0000 mg | ORAL_TABLET | Freq: Two times a day (BID) | ORAL | 11 refills | Status: DC

## 2017-04-12 ENCOUNTER — Encounter: Payer: Self-pay | Admitting: Cardiovascular Disease

## 2017-04-13 NOTE — Progress Notes (Signed)
Patient ID: Tanner Rich, male   DOB: [birth year], 61 y.o.   MRN: [id number]     Cardiology Office Note   Date:  04/14/2017   ID:  Tanner Rich, DOB July 12, 1956, MRN [id number]  PCP:  Marcell Anger, NP  Cardiologist:   Charlton Haws, MD   Chief Complaint  Patient presents with  . Atrial Fibrillation      History of Present Illness: Tanner Rich is a 61 y.o. male who presented for evaluation of afib 04/02/15  .  History of RICA occlusion and CVA in 2000.  11/2014 noted to have afib.  Started on eliquis and metoprolol  Echo ? Normal with diastolic dysfunction Notes don't indicate size of atria.  Cardioverted in January but reverted in 2-3 days to afib.  Notes indicate would consider AAD.  Also history of OSA HTN, elevated lipids and obesity.  He has documented sleep apnea over 8 years ago but not wearing CPAP.  Denies ETOH.  Feels very fatigued and dyspnic while in afib.    Was not taking eliquis correctly.  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 A-fib (HCC)   . CVA (cerebral vascular accident) (HCC)   . GERD (gastroesophageal reflux disease)   . HLD (hyperlipidemia)   . HTN (hypertension)   . Obesity   . OSA (obstructive sleep apnea)   . OSA (obstructive sleep apnea) 05/07/2015    Past Surgical History:  Procedure Laterality Date  . CARDIOVERSION N/A 06/03/2015   Procedure: CARDIOVERSION;  Surgeon: Wendall Stade, MD;  Location: [facility] ENDOSCOPY;  Service:  Cardiovascular;  Laterality: N/A;  . TONSILLECTOMY       Current Outpatient Prescriptions  Medication Sig Dispense Refill  . apixaban (ELIQUIS) 5 MG TABS tablet Take 5 mg by mouth daily.    Marland Kitchen escitalopram (LEXAPRO) 5 MG tablet Take 5 mg by mouth daily.    . flecainide (TAMBOCOR) 100 MG tablet Take 1 tablet (100 mg total) by mouth 2 (two) times daily. 180 tablet 3  . PRILOSEC OTC 20 MG tablet Take 20 mg by mouth daily.    . enalapril (VASOTEC) 20 MG tablet Take 20 mg by mouth daily.    . pravastatin (PRAVACHOL) 20 MG tablet Take 20 mg by mouth daily.     No current facility-administered medications for this visit.     Allergies:   Patient has no known allergies.    Social History:  The patient  reports that he has never smoked. He has never used smokeless tobacco. He reports that he does not drink alcohol or use drugs.   Family History:  The patient's family history includes Atrial fibrillation in his mother; Cancer in his father; Heart disease in his father.    ROS:  Please see the history of present illness.   Otherwise, review of systems are positive for none.   All other systems are  reviewed and negative.    PHYSICAL EXAM: VS:  BP (!) 142/98   Pulse 84   Ht  (1.727 m)   Wt 207 lb (93.9 kg)   SpO2 95%   BMI 31.47 kg/m  , BMI Body mass index is 31.47 kg/m. GEN: Well nourished, well developed, in no acute distress  HEENT: normal  Neck: no JVD, carotid bruits, or masses Cardiac:  RRR; no murmurs, rubs, or gallops,no edema  Respiratory:  clear to auscultation bilaterally, normal work of breathing GI: soft but tender in RLQ and side  MS: no deformity or atrophy  Skin: warm and dry, no rash Neuro:  Strength and sensation are intact Psych: euthymic mood, full affect   EKG:  04/16/15 Afib nonspecifc inferior ST changes QRS 92  QT 384/442  Rate 80   05/28/15  afib rate 74 QRS 94 ICRBBB   08/21/15 SB rate 55  ICRBBB  NSR compared to June  01/03/17 SR rate 56 PVC ICRBBB T  wave inversions 3,F  04/14/17  afib rate 83  ICRBBB   Recent Labs: No results found for requested labs within last 8760 hours.    Lipid Panel No results found for: CHOL, TRIG, HDL, CHOLHDL, VLDL, LDLCALC, LDLDIRECT    Wt Readings from Last 3 Encounters:  04/14/17 207 lb (93.9 kg)  01/03/17 209 lb (94.8 kg)  08/21/15 207 lb 12.8 oz (94.3 kg)      Other studies Reviewed: Additional studies/ records that were reviewed today include: Over 50 pages of records from [facility]  See HPO.    ASSESSMENT AND PLAN:  1.  Afib: Post repeat [facility] 06/03/15 Recurrence ? 2 weeks ago. Increase flecainide 100 bid ETT in 2 weeks f/u afib clinic and if still in afib arrange [facility] with me  2. OSA  Continue CPAP f/u Dr Mayford Knife  3. Chol:  Continue statin given history of stoke  F/u labs primary 4. CVA:  2000 lytics and indicates artery opened ? Thrombotic ICA occlusion  Left ICA wide open F/u duplex next visit July 2018  5. Flank pain:  Acute onset given anticoagulation will arrange non contrast CT of abdomen and pelvis To r/o bleed. F/U with primary if negative    Current medicines are reviewed at length with the patient today.  The patient does not have concerns regarding medicines.  The following changes have been made:  Increase Flecainide 100 bid  Labs/ tests ordered today include:  ETT CT Abdomen     Orders Placed This Encounter  Procedures  . CT ABDOMEN PELVIS WO CONTRAST  . EXERCISE TOLERANCE TEST  . EKG 12-Lead     Disposition:   FU with me 6 months    Signed, Charlton Haws, MD  04/14/2017 1:56 PM    [facility] [address] [place], [place], [place]  [address] Phone: [phone]; Fax: [phone]

## 2017-04-14 ENCOUNTER — Encounter: Payer: Self-pay | Admitting: Cardiovascular Disease

## 2017-04-14 ENCOUNTER — Ambulatory Visit (HOSPITAL_COMMUNITY)
Admission: RE | Admit: 2017-04-14 | Discharge: 2017-04-14 | Disposition: A | Discharge status: Home / Self Care | Payer: BC Managed Care – PPO | Source: Ambulatory Visit | Attending: Cardiovascular Disease | Admitting: Cardiovascular Disease

## 2017-04-14 ENCOUNTER — Encounter (INDEPENDENT_AMBULATORY_CARE_PROVIDER_SITE_OTHER): Payer: Self-pay

## 2017-04-14 ENCOUNTER — Ambulatory Visit (INDEPENDENT_AMBULATORY_CARE_PROVIDER_SITE_OTHER): Payer: BC Managed Care – PPO | Admitting: Cardiovascular Disease

## 2017-04-14 VITALS — BP 142/98 | HR 84 | Ht 68.0 in | Wt 207.0 lb

## 2017-04-14 DIAGNOSIS — R58 Hemorrhage, not elsewhere classified: Secondary | ICD-10-CM | POA: Diagnosis not present

## 2017-04-14 DIAGNOSIS — K449 Diaphragmatic hernia without obstruction or gangrene: Secondary | ICD-10-CM | POA: Insufficient documentation

## 2017-04-14 DIAGNOSIS — I700 Atherosclerosis of aorta: Secondary | ICD-10-CM | POA: Insufficient documentation

## 2017-04-14 DIAGNOSIS — I481 Persistent atrial fibrillation: Secondary | ICD-10-CM | POA: Insufficient documentation

## 2017-04-14 DIAGNOSIS — Z79899 Other long term (current) drug therapy: Secondary | ICD-10-CM | POA: Diagnosis not present

## 2017-04-14 DIAGNOSIS — I4819 Other persistent atrial fibrillation: Secondary | ICD-10-CM

## 2017-04-14 MED ORDER — FLECAINIDE ACETATE 100 MG PO TABS: 100.0000 mg | ORAL_TABLET | Freq: Two times a day (BID) | ORAL | 3 refills | Status: DC

## 2017-04-14 NOTE — Patient Instructions (Addendum)
Medication Instructions:  Your physician has recommended you make the following change in your medication:  1-INCREASE Flecainide to 100 mg by mouth twice daily  Labwork: NONE  Testing/Procedures: Non-Cardiac CT scanning of abdomen and pelvis today, (CAT scanning), is a noninvasive, special x-ray that produces cross-sectional images of the body using x-rays and a computer. CT scans help physicians diagnose and treat medical conditions. For some CT exams, a contrast material is used to enhance visibility in the area of the body being studied. CT scans provide greater clarity and reveal more details than regular x-ray exams.  Your physician has requested that you have an exercise tolerance test in two weeks. For further information please visit [email or web address] Please also follow instruction sheet, as given.  Follow-Up: Your physician recommends that you schedule a follow-up appointment in: 3 weeks with [facility].  Your physician wants you to follow-up in:6 months with Dr. Eden Emms. You will receive a reminder letter in the mail two months in advance. If you don't receive a letter, please call our office to schedule the follow-up appointment.    If you need a refill on your cardiac medications before your next appointment, please call your pharmacy.

## 2017-04-27 ENCOUNTER — Ambulatory Visit (INDEPENDENT_AMBULATORY_CARE_PROVIDER_SITE_OTHER): Payer: BC Managed Care – PPO

## 2017-04-27 DIAGNOSIS — I481 Persistent atrial fibrillation: Secondary | ICD-10-CM | POA: Diagnosis not present

## 2017-04-27 DIAGNOSIS — Z79899 Other long term (current) drug therapy: Secondary | ICD-10-CM

## 2017-04-27 DIAGNOSIS — I4819 Other persistent atrial fibrillation: Secondary | ICD-10-CM

## 2017-04-27 LAB — EXERCISE TOLERANCE TEST
CSEPED: 6 min
CSEPEDS: 0 s
CSEPHR: 98 %
CSEPPHR: 155 {beats}/min
Estimated workload: 7 METS
MPHR: 157 {beats}/min
RPE: 15
Rest HR: 84 {beats}/min

## 2017-05-09 ENCOUNTER — Other Ambulatory Visit: Payer: Self-pay

## 2017-05-09 ENCOUNTER — Encounter (HOSPITAL_COMMUNITY): Payer: Self-pay | Admitting: Nurse Practitioner

## 2017-05-09 ENCOUNTER — Ambulatory Visit (HOSPITAL_COMMUNITY)
Admission: RE | Admit: 2017-05-09 | Discharge: 2017-05-09 | Disposition: A | Discharge status: Home / Self Care | Payer: BC Managed Care – PPO | Source: Ambulatory Visit | Attending: Nurse Practitioner | Admitting: Nurse Practitioner

## 2017-05-09 VITALS — BP 136/88 | HR 101 | Ht 68.0 in | Wt 205.0 lb

## 2017-05-09 DIAGNOSIS — Z8673 Personal history of transient ischemic attack (TIA), and cerebral infarction without residual deficits: Secondary | ICD-10-CM | POA: Insufficient documentation

## 2017-05-09 DIAGNOSIS — E669 Obesity, unspecified: Secondary | ICD-10-CM | POA: Insufficient documentation

## 2017-05-09 DIAGNOSIS — I10 Essential (primary) hypertension: Secondary | ICD-10-CM | POA: Insufficient documentation

## 2017-05-09 DIAGNOSIS — G4733 Obstructive sleep apnea (adult) (pediatric): Secondary | ICD-10-CM | POA: Diagnosis not present

## 2017-05-09 DIAGNOSIS — I4819 Other persistent atrial fibrillation: Secondary | ICD-10-CM

## 2017-05-09 DIAGNOSIS — K219 Gastro-esophageal reflux disease without esophagitis: Secondary | ICD-10-CM | POA: Diagnosis not present

## 2017-05-09 DIAGNOSIS — I481 Persistent atrial fibrillation: Secondary | ICD-10-CM | POA: Diagnosis not present

## 2017-05-09 DIAGNOSIS — E785 Hyperlipidemia, unspecified: Secondary | ICD-10-CM | POA: Diagnosis not present

## 2017-05-09 DIAGNOSIS — I480 Paroxysmal atrial fibrillation: Secondary | ICD-10-CM | POA: Diagnosis present

## 2017-05-09 LAB — BASIC METABOLIC PANEL
Anion gap: 8 (ref 5–15)
BUN: 17 mg/dL (ref 6–20)
CHLORIDE: 105 mmol/L (ref 101–111)
CO2: 26 mmol/L (ref 22–32)
Calcium: 9 mg/dL (ref 8.9–10.3)
Creatinine, Ser: 1.11 mg/dL (ref 0.61–1.24)
GFR calc Af Amer: >60 mL/min (ref >60)
GFR calc non Af Amer: >60 mL/min (ref >60)
GLUCOSE: 93 mg/dL (ref 65–99)
POTASSIUM: 4.1 mmol/L (ref 3.5–5.1)
SODIUM: 139 mmol/L (ref 135–145)

## 2017-05-09 LAB — CBC
HCT: 43.4 % (ref 39.0–52.0)
Hemoglobin: 14 g/dL (ref 13.0–17.0)
MCH: 27.9 pg (ref 26.0–34.0)
MCHC: 32.3 g/dL (ref 30.0–36.0)
MCV: 86.6 fL (ref 78.0–100.0)
PLATELETS: 173 10*3/uL (ref 150–400)
RBC: 5.01 MIL/uL (ref 4.22–5.81)
RDW: 14.3 % (ref 11.5–15.5)
WBC: 5.6 10*3/uL (ref 4.0–10.5)

## 2017-05-09 LAB — TSH: TSH: 1.563 u[IU]/mL (ref 0.350–4.500)

## 2017-05-09 NOTE — Progress Notes (Signed)
Primary Care Physician: Marcell Angerlark, Candice P, NP Referring Physician: Dr. Fletcher [name]   [name] is a 61 y.o. male with a h/o paroxysmal afib that was recently see by Dr. Jasper [name] 4/19 and found to be in persistent afib . He has a chadsvasc score of at least 3 and was taking eliquis 5 mg qd instead of bid. He was asked to  Increase flecainide to 100 mg bid and to take eliquis correctly bid. In the afib clinic today, he remains in afib despite increase in flecainide. He has been on correct does of flecainide for at least the last 3 weeks. He has had previous cardioversion's and is wanting to proceed with cardioversion as per Dr. Fabio BeringNishan's plan. He is not consistent in use of cpap for sleep apnea.  Today, he denies symptoms of palpitations, chest pain, shortness of breath, orthopnea, PND, lower extremity edema, dizziness, presyncope, syncope, or neurologic sequela. The patient is tolerating medications without difficulties and is otherwise without complaint today.   Past Medical History:  Diagnosis Date  . A-fib (HCC)   . CVA (cerebral vascular accident) (HCC)   . GERD (gastroesophageal reflux disease)   . HLD (hyperlipidemia)   . HTN (hypertension)   . Obesity   . OSA (obstructive sleep apnea)   . OSA (obstructive sleep apnea) 05/07/2015   Past Surgical History:  Procedure Laterality Date  . CARDIOVERSION N/A 06/03/2015   Procedure: CARDIOVERSION;  Surgeon: Tanner StadePeter C Nishan, MD;  [facility];  Service: Cardiovascular;  Laterality: N/A;  . TONSILLECTOMY      Current Outpatient Prescriptions  Medication Sig Dispense Refill  . apixaban (ELIQUIS) 5 MG TABS tablet Take 5 mg by mouth 2 (two) times daily.     . enalapril (VASOTEC) 20 MG tablet Take 20 mg by mouth daily.    Tanner [name]. escitalopram (LEXAPRO) 5 MG tablet Take 5 mg by mouth daily.    Tanner [name]. esomeprazole (NEXIUM) 20 MG packet Take 20 mg by mouth daily before breakfast.    . flecainide (TAMBOCOR) 100 MG tablet Take 1 tablet (100 mg total) by  mouth 2 (two) times daily. 180 tablet 3  . pravastatin (PRAVACHOL) 20 MG tablet Take 20 mg by mouth daily.     No current facility-administered medications for this encounter.     No Known Allergies  Social History   Social History  . Marital status: Married    Spouse name: N/A  . Number of children: N/A  . Years of education: N/A   Occupational History  . Not on file.   Social History Main Topics  . Smoking status: Never Smoker  . Smokeless tobacco: Never Used  . Alcohol use No  . Drug use: No  . Sexual activity: Not on file   Other Topics Concern  . Not on file   Social History Narrative  . No narrative on file    Family History  Problem Relation Age of Onset  . Atrial fibrillation Mother   . Cancer Father        BONE  . Heart disease Father   . Prostate cancer Unknown     ROS- All systems are reviewed and negative except as per the HPI above  Physical Exam: Vitals:   05/09/17 0829  BP: 136/88  Pulse: (!) 101  Weight: 205 lb (93 kg)  Height: 5\' 8"  (1.727 m)   Wt Readings from Last 3 Encounters:  05/09/17 205 lb (93 kg)  04/14/17 207 lb (93.9 kg)  01/03/17 209  lb (94.8 kg)    Labs: Lab Results  Component Value Date   NA 139 05/09/2017   K 4.1 05/09/2017   CL 105 05/09/2017   CO2 26 05/09/2017   GLUCOSE 93 05/09/2017   BUN 17 05/09/2017   CREATININE 1.11 05/09/2017   CALCIUM 9.0 05/09/2017   No results found for: INR No results found for: CHOL, HDL, LDLCALC, TRIG   GEN- The patient is well appearing, alert and oriented x 3 today.   Head- normocephalic, atraumatic Eyes-  Sclera clear, conjunctiva pink Ears- hearing intact Oropharynx- clear Neck- supple, no JVP Lymph- no cervical lymphadenopathy Lungs- Clear to ausculation bilaterally, normal work of breathing Heart- irregular rate and rhythm, no murmurs, rubs or gallops, PMI not laterally displaced GI- soft, NT, ND, + BS Extremities- no clubbing, cyanosis, or edema MS- no significant  deformity or atrophy Skin- no rash or lesion Psych- euthymic mood, full affect Neuro- strength and sensation are intact  EKG-atrial flutter at 101 bpm, RBBB, qrs int 110 ms, qtc 422 ms Epic records reviewed    Assessment and Plan: 1. Persistent afib Continue flecainide 100 mg bid Continue apixaban 5 mg bid and has taken bid x at least 3 weeks Scheduled for cardioversion with Dr. Eden [name] 5/16, risk vrs benefit discussed Encouraged use of cpap  F/u in afib clinic x one week  Lupita Leash C[facility] [address] [place], [place] [address] [phone]  Bmt/cbc/tsh today

## 2017-05-09 NOTE — Patient Instructions (Signed)
Cardioversion scheduled for Wednesday, May 16th  - Ar[facility] at the [facility] and go to admitting at 12:30PM  -Do not eat or drink anything after midnight the night prior to your procedure.  - Take all your medication with a sip of water prior to arrival.  - You will not be able to drive home after your procedure.

## 2017-05-11 ENCOUNTER — Ambulatory Visit (HOSPITAL_COMMUNITY): Payer: BC Managed Care – PPO | Admitting: Anesthesiology

## 2017-05-11 ENCOUNTER — Encounter (HOSPITAL_COMMUNITY)
Admission: RE | Disposition: A | Discharge status: Home / Self Care | Payer: Self-pay | Source: Ambulatory Visit | Attending: Cardiovascular Disease

## 2017-05-11 ENCOUNTER — Ambulatory Visit (HOSPITAL_COMMUNITY)
Admission: RE | Admit: 2017-05-11 | Discharge: 2017-05-11 | Disposition: A | Discharge status: Home / Self Care | Payer: BC Managed Care – PPO | Source: Ambulatory Visit | Attending: Cardiovascular Disease | Admitting: Cardiovascular Disease

## 2017-05-11 ENCOUNTER — Encounter (HOSPITAL_COMMUNITY): Payer: Self-pay | Admitting: *Deleted

## 2017-05-11 DIAGNOSIS — K219 Gastro-esophageal reflux disease without esophagitis: Secondary | ICD-10-CM | POA: Diagnosis not present

## 2017-05-11 DIAGNOSIS — I481 Persistent atrial fibrillation: Secondary | ICD-10-CM | POA: Insufficient documentation

## 2017-05-11 DIAGNOSIS — Z6831 Body mass index (BMI) 31.0-31.9, adult: Secondary | ICD-10-CM | POA: Diagnosis not present

## 2017-05-11 DIAGNOSIS — G4733 Obstructive sleep apnea (adult) (pediatric): Secondary | ICD-10-CM | POA: Insufficient documentation

## 2017-05-11 DIAGNOSIS — I480 Paroxysmal atrial fibrillation: Secondary | ICD-10-CM | POA: Diagnosis not present

## 2017-05-11 DIAGNOSIS — I10 Essential (primary) hypertension: Secondary | ICD-10-CM | POA: Diagnosis not present

## 2017-05-11 DIAGNOSIS — E785 Hyperlipidemia, unspecified: Secondary | ICD-10-CM | POA: Insufficient documentation

## 2017-05-11 DIAGNOSIS — E669 Obesity, unspecified: Secondary | ICD-10-CM | POA: Insufficient documentation

## 2017-05-11 DIAGNOSIS — Z8673 Personal history of transient ischemic attack (TIA), and cerebral infarction without residual deficits: Secondary | ICD-10-CM | POA: Diagnosis not present

## 2017-05-11 DIAGNOSIS — Z7901 Long term (current) use of anticoagulants: Secondary | ICD-10-CM | POA: Insufficient documentation

## 2017-05-11 DIAGNOSIS — I4891 Unspecified atrial fibrillation: Secondary | ICD-10-CM | POA: Diagnosis present

## 2017-05-11 HISTORY — PX: CARDIOVERSION: SHX1299

## 2017-05-11 SURGERY — CARDIOVERSION
Anesthesia: General

## 2017-05-11 MED ORDER — LIDOCAINE 2% (20 MG/ML) 5 ML SYRINGE
INTRAMUSCULAR | Status: DC | PRN
  Administered 2017-05-11: 40 mg via INTRAVENOUS

## 2017-05-11 MED ORDER — SODIUM CHLORIDE 0.9 % IV SOLN
INTRAVENOUS | Status: DC
  Administered 2017-05-11: 13:00:00 via INTRAVENOUS

## 2017-05-11 MED ORDER — PROPOFOL 10 MG/ML IV BOLUS
INTRAVENOUS | Status: DC | PRN
  Administered 2017-05-11: 60 mg via INTRAVENOUS

## 2017-05-11 NOTE — CV Procedure (Signed)
[facility]: Anesthesia:  Dr Sampson GoonFitzgerald  [facility] x 1 converted from afib rate 101 To NSR rate 78  On Rx Eliquis with no missed doses No immediate neurologic sequelae   Charlton HawsPeter Nishan, MD

## 2017-05-11 NOTE — Anesthesia Postprocedure Evaluation (Signed)
Anesthesia Post Note  Patient: Tanner Rich  Procedure(s) Performed: Procedure(s) (LRB): CARDIOVERSION (N/A)  Patient location during evaluation: PACU Anesthesia Type: General Level of consciousness: awake and alert Pain management: pain level controlled Vital Signs Assessment: post-procedure vital signs reviewed and stable Respiratory status: spontaneous breathing, nonlabored ventilation, respiratory function stable and patient connected to nasal cannula oxygen Cardiovascular status: blood pressure returned to baseline and stable Postop Assessment: no signs of nausea or vomiting Anesthetic complications: no       Last Vitals:  Vitals:   05/11/17 1410 05/11/17 1416  BP: (!) 139/91 130/70  Pulse: 65 63  Resp: 17 16  Temp:      Last Pain:  Vitals:   05/11/17 1406  TempSrc: Oral                 Kennieth RadFitzgerald, Deborah Lazcano E

## 2017-05-11 NOTE — Anesthesia Preprocedure Evaluation (Addendum)
Anesthesia Evaluation  Patient identified by MRN, date of birth, ID band Patient awake    Reviewed: Allergy & Precautions, NPO status , Patient's Chart, lab work & pertinent test results  Airway Mallampati: III  TM Distance: >3 FB Neck ROM: Full    Dental   Pulmonary sleep apnea ,    breath sounds clear to auscultation       Cardiovascular hypertension, Pt. on medications  Rhythm:Regular Rate:Normal     Neuro/Psych CVA    GI/Hepatic Neg liver ROS, GERD  ,  Endo/Other  negative endocrine ROS  Renal/GU negative Renal ROS     Musculoskeletal   Abdominal   Peds  Hematology negative hematology ROS (+)   Anesthesia Other Findings   Reproductive/Obstetrics                             Anesthesia Physical Anesthesia Plan  ASA: III  Anesthesia Plan: General   Post-op Pain Management:    Induction: Intravenous  Airway Management Planned: Mask and Natural Airway  Additional Equipment:   Intra-op Plan:   Post-operative Plan:   Informed Consent: I have reviewed the patients History and Physical, chart, labs and discussed the procedure including the risks, benefits and alternatives for the proposed anesthesia with the patient or authorized representative who has indicated his/her understanding and acceptance.     Plan Discussed with:   Anesthesia Plan Comments:         Anesthesia Quick Evaluation

## 2017-05-11 NOTE — Discharge Instructions (Signed)
Moderate Conscious Sedation, Adult °Sedation is the use of medicines to promote relaxation and relieve discomfort and anxiety. Moderate conscious sedation is a type of sedation. Under moderate conscious sedation, you are less alert than normal, but you are still able to respond to instructions, touch, or both. °Moderate conscious sedation is used during short medical and dental procedures. It is milder than deep sedation, which is a type of sedation under which you cannot be easily woken up. It is also milder than general anesthesia, which is the use of medicines to make you unconscious. Moderate conscious sedation allows you to return to your regular activities sooner. °Tell a health care provider about: °· Any allergies you have. °· All medicines you are taking, including vitamins, herbs, eye drops, creams, and over-the-counter medicines. °· Use of steroids (by mouth or creams). °· Any problems you or family members have had with sedatives and anesthetic medicines. °· Any blood disorders you have. °· Any surgeries you have had. °· Any medical conditions you have, such as sleep apnea. °· Whether you are pregnant or may be pregnant. °· Any use of cigarettes, alcohol, marijuana, or street drugs. °What are the risks? °Generally, this is a safe procedure. However, problems may occur, including: °· Getting too much medicine (oversedation). °· Nausea. °· Allergic reaction to medicines. °· Trouble breathing. If this happens, a breathing tube may be used to help with breathing. It will be removed when you are awake and breathing on your own. °· Heart trouble. °· Lung trouble. °What happens before the procedure? °Staying hydrated  °Follow instructions from your health care provider about hydration, which may include: °· Up to 2 hours before the procedure - you may continue to drink clear liquids, such as water, clear fruit juice, black coffee, and plain tea. °Eating and drinking restrictions  °Follow instructions from your  health care provider about eating and drinking, which may include: °· 8 hours before the procedure - stop eating heavy meals or foods such as meat, fried foods, or fatty foods. °· 6 hours before the procedure - stop eating light meals or foods, such as toast or cereal. °· 6 hours before the procedure - stop drinking milk or drinks that contain milk. °· 2 hours before the procedure - stop drinking clear liquids. °Medicine  ° °Ask your health care provider about: °· Changing or stopping your regular medicines. This is especially important if you are taking diabetes medicines or blood thinners. °· Taking medicines such as aspirin and ibuprofen. These medicines can thin your blood. Do not take these medicines before your procedure if your health care provider instructs you not to. °Tests and exams  °· You will have a physical exam. °· You may have blood tests done to show: °¨ How well your kidneys and liver are working. °¨ How well your blood can clot. °General instructions  °· Plan to have someone take you home from the hospital or clinic. °· If you will be going home right after the procedure, plan to have someone with you for 24 hours. °What happens during the procedure? °· An IV tube will be inserted into one of your veins. °· Medicine to help you relax (sedative) will be given through the IV tube. °· The medical or dental procedure will be performed. °What happens after the procedure? °· Your blood pressure, heart rate, breathing rate, and blood oxygen level will be monitored often until the medicines you were given have worn off. °· Do not drive for 24   hours. °This information is not intended to replace advice given to you by your health care provider. Make sure you discuss any questions you have with your health care provider. °Document Released: 09/07/2001 Document Revised: 05/18/2016 Document Reviewed: 04/03/2016 °Elsevier Interactive Patient Education © 2017 [employer] °Electrical Cardioversion, Care  After °This sheet gives you information about how to care for yourself after your procedure. Your health care provider may also give you more specific instructions. If you have problems or questions, contact your health care provider. °What can I expect after the procedure? °After the procedure, it is common to have: °· Some redness on the skin where the shocks were given. °Follow these instructions at home: °· Do not drive for 24 hours if you were given a medicine to help you relax (sedative). °· Take over-the-counter and prescription medicines only as told by your health care provider. °· Ask your health care provider how to check your pulse. Check it often. °· Rest for 48 hours after the procedure or as told by your health care provider. °· Avoid or limit your caffeine use as told by your health care provider. °Contact a health care provider if: °· You feel like your heart is beating too quickly or your pulse is not regular. °· You have a serious muscle cramp that does not go away. °Get help right away if: °· You have discomfort in your chest. °· You are dizzy or you feel faint. °· You have trouble breathing or you are short of breath. °· Your speech is slurred. °· You have trouble moving an arm or leg on one side of your body. °· Your fingers or toes turn cold or blue. °This information is not intended to replace advice given to you by your health care provider. Make sure you discuss any questions you have with your health care provider. °Document Released: 10/03/2013 Document Revised: 07/16/2016 Document Reviewed: 06/18/2016 °Elsevier Interactive Patient Education © 2017 Elsevier Inc. ° °

## 2017-05-11 NOTE — Interval H&P Note (Signed)
History and Physical Interval Note:  05/11/2017 11:42 AM  Tanner Rich  has presented today for surgery, with the diagnosis of AFIB  The various methods of treatment have been discussed with the patient and family. After consideration of risks, benefits and other options for treatment, the patient has consented to  Procedure(s): CARDIOVERSION (N/A) as a surgical intervention .  The patient's history has been reviewed, patient examined, no change in status, stable for surgery.  I have reviewed the patient's chart and labs.  Questions were answered to the patient's satisfaction.     Charlton HawsPeter Juandaniel Manfredo

## 2017-05-11 NOTE — Interval H&P Note (Signed)
History and Physical Interval Note:  05/11/2017 1:56 PM  Len BlalockJames W Apolinar  has presented today for surgery, with the diagnosis of AFIB  The various methods of treatment have been discussed with the patient and family. After consideration of risks, benefits and other options for treatment, the patient has consented to  Procedure(s): CARDIOVERSION (N/A) as a surgical intervention .  The patient's history has been reviewed, patient examined, no change in status, stable for surgery.  I have reviewed the patient's chart and labs.  Questions were answered to the patient's satisfaction.     Charlton HawsPeter Reyhan Moronta

## 2017-05-11 NOTE — Transfer of Care (Signed)
Immediate Anesthesia Transfer of Care Note  Patient: Tanner Rich  Procedure(s) Performed: Procedure(s): CARDIOVERSION (N/A)  Patient Location: Endoscopy Unit  Anesthesia Type:General  Level of Consciousness: awake and alert   Airway & Oxygen Therapy: Patient Spontanous Breathing  Post-op Assessment: Report given to RN and Post -op Vital signs reviewed and stable  Post vital signs: Reviewed and stable  Last Vitals:  Vitals:   05/11/17 1301  BP: (!) 150/103  Resp: (!) 97  Temp: 36.4 C    Last Pain:  Vitals:   05/11/17 1301  TempSrc: Oral         Complications: No apparent anesthesia complications

## 2017-05-11 NOTE — Interval H&P Note (Signed)
History and Physical Interval Note:  05/11/2017 1:24 PM  Tanner Rich  has presented today for surgery, with the diagnosis of AFIB  The various methods of treatment have been discussed with the patient and family. After consideration of risks, benefits and other options for treatment, the patient has consented to  Procedure(s): CARDIOVERSION (N/A) as a surgical intervention .  The patient's history has been reviewed, patient examined, no change in status, stable for surgery.  I have reviewed the patient's chart and labs.  Questions were answered to the patient's satisfaction.     Charlton HawsPeter Kalesha Irving

## 2017-05-11 NOTE — H&P (View-Only) (Signed)
Primary Care Physician: Marcell Angerlark, Candice P, NP Referring Physician: Dr. Fletcher AnonNishan   Tanner Rich is a 61 y.o. male with a h/o paroxysmal afib that was recently see by Dr. Jasper LoserNisahn 4/19 and found to be in persistent afib . He has a chadsvasc score of at least 3 and was taking eliquis 5 mg qd instead of bid. He was asked to  Increase flecainide to 100 mg bid and to take eliquis correctly bid. In the afib clinic today, he remains in afib despite increase in flecainide. He has been on correct does of flecainide for at least the last 3 weeks. He has had previous cardioversion's and is wanting to proceed with cardioversion as per Dr. Fabio BeringNishan's plan. He is not consistent in use of cpap for sleep apnea.  Today, he denies symptoms of palpitations, chest pain, shortness of breath, orthopnea, PND, lower extremity edema, dizziness, presyncope, syncope, or neurologic sequela. The patient is tolerating medications without difficulties and is otherwise without complaint today.   Past Medical History:  Diagnosis Date  . A-fib (HCC)   . CVA (cerebral vascular accident) (HCC)   . GERD (gastroesophageal reflux disease)   . HLD (hyperlipidemia)   . HTN (hypertension)   . Obesity   . OSA (obstructive sleep apnea)   . OSA (obstructive sleep apnea) 05/07/2015   Past Surgical History:  Procedure Laterality Date  . CARDIOVERSION N/A 06/03/2015   Procedure: CARDIOVERSION;  Surgeon: Wendall StadePeter C Nishan, MD;  [facility];  Service: Cardiovascular;  Laterality: N/A;  . TONSILLECTOMY      Current Outpatient Prescriptions  Medication Sig Dispense Refill  . apixaban (ELIQUIS) 5 MG TABS tablet Take 5 mg by mouth 2 (two) times daily.     . enalapril (VASOTEC) 20 MG tablet Take 20 mg by mouth daily.    Marland Kitchen. escitalopram (LEXAPRO) 5 MG tablet Take 5 mg by mouth daily.    Marland Kitchen. esomeprazole (NEXIUM) 20 MG packet Take 20 mg by mouth daily before breakfast.    . flecainide (TAMBOCOR) 100 MG tablet Take 1 tablet (100 mg total) by  mouth 2 (two) times daily. 180 tablet 3  . pravastatin (PRAVACHOL) 20 MG tablet Take 20 mg by mouth daily.     No current facility-administered medications for this encounter.     No Known Allergies  Social History   Social History  . Marital status: Married    Spouse name: N/A  . Number of children: N/A  . Years of education: N/A   Occupational History  . Not on file.   Social History Main Topics  . Smoking status: Never Smoker  . Smokeless tobacco: Never Used  . Alcohol use No  . Drug use: No  . Sexual activity: Not on file   Other Topics Concern  . Not on file   Social History Narrative  . No narrative on file    Family History  Problem Relation Age of Onset  . Atrial fibrillation Mother   . Cancer Father        BONE  . Heart disease Father   . Prostate cancer Unknown     ROS- All systems are reviewed and negative except as per the HPI above  Physical Exam: Vitals:   05/09/17 0829  BP: 136/88  Pulse: (!) 101  Weight: 205 lb (93 kg)  Height: 5\' 8"  (1.727 m)   Wt Readings from Last 3 Encounters:  05/09/17 205 lb (93 kg)  04/14/17 207 lb (93.9 kg)  01/03/17 209  lb (94.8 kg)    Labs: Lab Results  Component Value Date   NA 139 05/09/2017   K 4.1 05/09/2017   CL 105 05/09/2017   CO2 26 05/09/2017   GLUCOSE 93 05/09/2017   BUN 17 05/09/2017   CREATININE 1.11 05/09/2017   CALCIUM 9.0 05/09/2017   No results found for: INR No results found for: CHOL, HDL, LDLCALC, TRIG   GEN- The patient is well appearing, alert and oriented x 3 today.   Head- normocephalic, atraumatic Eyes-  Sclera clear, conjunctiva pink Ears- hearing intact Oropharynx- clear Neck- supple, no JVP Lymph- no cervical lymphadenopathy Lungs- Clear to ausculation bilaterally, normal work of breathing Heart- irregular rate and rhythm, no murmurs, rubs or gallops, PMI not laterally displaced GI- soft, NT, ND, + BS Extremities- no clubbing, cyanosis, or edema MS- no significant  deformity or atrophy Skin- no rash or lesion Psych- euthymic mood, full affect Neuro- strength and sensation are intact  EKG-atrial flutter at 101 bpm, RBBB, qrs int 110 ms, qtc 422 ms Epic records reviewed    Assessment and Plan: 1. Persistent afib Continue flecainide 100 mg bid Continue apixaban 5 mg bid and has taken bid x at least 3 weeks Scheduled for cardioversion with Dr. Eden Emms 5/16, risk vrs benefit discussed Encouraged use of cpap  F/u in afib clinic x one week  Lupita Leash C. Matthew Folks [facility] [address] [place], [place] [address] [phone]  Bmt/cbc/tsh today

## 2017-05-20 ENCOUNTER — Ambulatory Visit (HOSPITAL_COMMUNITY)
Admission: RE | Admit: 2017-05-20 | Discharge: 2017-05-20 | Disposition: A | Discharge status: Home / Self Care | Payer: BC Managed Care – PPO | Source: Ambulatory Visit | Attending: Nurse Practitioner | Admitting: Nurse Practitioner

## 2017-05-20 ENCOUNTER — Encounter (HOSPITAL_COMMUNITY): Payer: Self-pay | Admitting: Nurse Practitioner

## 2017-05-20 VITALS — BP 160/94 | HR 58 | Ht 68.0 in | Wt 204.4 lb

## 2017-05-20 DIAGNOSIS — Z6831 Body mass index (BMI) 31.0-31.9, adult: Secondary | ICD-10-CM | POA: Diagnosis not present

## 2017-05-20 DIAGNOSIS — Z79899 Other long term (current) drug therapy: Secondary | ICD-10-CM | POA: Insufficient documentation

## 2017-05-20 DIAGNOSIS — E785 Hyperlipidemia, unspecified: Secondary | ICD-10-CM | POA: Insufficient documentation

## 2017-05-20 DIAGNOSIS — I10 Essential (primary) hypertension: Secondary | ICD-10-CM | POA: Diagnosis not present

## 2017-05-20 DIAGNOSIS — E669 Obesity, unspecified: Secondary | ICD-10-CM | POA: Diagnosis not present

## 2017-05-20 DIAGNOSIS — Z7901 Long term (current) use of anticoagulants: Secondary | ICD-10-CM | POA: Diagnosis not present

## 2017-05-20 DIAGNOSIS — Z8673 Personal history of transient ischemic attack (TIA), and cerebral infarction without residual deficits: Secondary | ICD-10-CM | POA: Insufficient documentation

## 2017-05-20 DIAGNOSIS — I4891 Unspecified atrial fibrillation: Secondary | ICD-10-CM | POA: Diagnosis present

## 2017-05-20 DIAGNOSIS — K219 Gastro-esophageal reflux disease without esophagitis: Secondary | ICD-10-CM | POA: Diagnosis not present

## 2017-05-20 DIAGNOSIS — G4733 Obstructive sleep apnea (adult) (pediatric): Secondary | ICD-10-CM | POA: Insufficient documentation

## 2017-05-20 DIAGNOSIS — Z8249 Family history of ischemic heart disease and other diseases of the circulatory system: Secondary | ICD-10-CM | POA: Diagnosis not present

## 2017-05-20 DIAGNOSIS — I4819 Other persistent atrial fibrillation: Secondary | ICD-10-CM

## 2017-05-20 DIAGNOSIS — I481 Persistent atrial fibrillation: Secondary | ICD-10-CM | POA: Diagnosis not present

## 2017-05-20 NOTE — Progress Notes (Signed)
Primary Care Physician: Marcell Anger, NP Referring Physician: Dr. [name] is a 61 y.o. male with a h/o paroxysmal afib that was recently see by Dr. [name] 4/19 and found to be in persistent afib . He has a chadsvasc score of at least 3 and was taking eliquis 5 mg qd instead of bid. He was asked to  Increase flecainide to 100 mg bid and to take eliquis correctly bid. In the afib clinic today, he remains in afib despite increase in flecainide. He has been on correct does of flecainide for at least the last 3 weeks. He has had previous cardioversion's and is wanting to proceed with cardioversion as per Dr. [name] plan. He is not consistent in use of cpap for sleep apnea.  F/u successful cardioversion 5/16 and is staying in SR. States compliance with flecainide and eliquis. Sees some improvement in energy.  Today, he denies symptoms of palpitations, chest pain, shortness of breath, orthopnea, PND, lower extremity edema, dizziness, presyncope, syncope, or neurologic sequela. The patient is tolerating medications without difficulties and is otherwise without complaint today.   Past Medical History:  Diagnosis Date  . A-fib (HCC)   . CVA (cerebral vascular accident) (HCC)   . GERD (gastroesophageal reflux disease)   . HLD (hyperlipidemia)   . HTN (hypertension)   . Obesity   . OSA (obstructive sleep apnea)   . OSA (obstructive sleep apnea) 05/07/2015   Past Surgical History:  Procedure Laterality Date  . CARDIOVERSION N/A 06/03/2015   Procedure: CARDIOVERSION;  Surgeon: Wendall Stade, MD;  Location: [facility] ENDOSCOPY;  Service: Cardiovascular;  Laterality: N/A;  . CARDIOVERSION N/A 05/11/2017   Procedure: CARDIOVERSION;  Surgeon: Wendall Stade, MD;  Location: [facility] ENDOSCOPY;  Service: Cardiovascular;  Laterality: N/A;  . TONSILLECTOMY      Current Outpatient Prescriptions  Medication Sig Dispense Refill  . apixaban (ELIQUIS) 5 MG TABS tablet Take 5 mg by mouth 2 (two) times  daily.     . enalapril (VASOTEC) 20 MG tablet Take 20 mg by mouth daily.    Marland Kitchen escitalopram (LEXAPRO) 5 MG tablet Take 5 mg by mouth daily.    Marland Kitchen esomeprazole (NEXIUM) 20 MG packet Take 20 mg by mouth daily before breakfast.    . flecainide (TAMBOCOR) 100 MG tablet Take 1 tablet (100 mg total) by mouth 2 (two) times daily. 180 tablet 3  . pravastatin (PRAVACHOL) 20 MG tablet Take 20 mg by mouth daily.     No current facility-administered medications for this encounter.     No Known Allergies  Social History   Social History  . Marital status: Married    Spouse name: N/A  . Number of children: N/A  . Years of education: N/A   Occupational History  . Not on file.   Social History Main Topics  . Smoking status: Never Smoker  . Smokeless tobacco: Never Used  . Alcohol use No  . Drug use: No  . Sexual activity: Not on file   Other Topics Concern  . Not on file   Social History Narrative  . No narrative on file    Family History  Problem Relation Age of Onset  . Atrial fibrillation Mother   . Cancer Father        BONE  . Heart disease Father   . Prostate cancer Unknown     ROS- All systems are reviewed and negative except as per the HPI above  Physical Exam: Vitals:  05/20/17 0830  BP: (!) 160/94  Pulse: (!) 58  Weight: 204 lb 6.4 oz (92.7 kg)  Height: 5\' 8"  (1.727 m)   Wt Readings from Last 3 Encounters:  05/20/17 204 lb 6.4 oz (92.7 kg)  05/11/17 205 lb (93 kg)  05/09/17 205 lb (93 kg)    Labs: Lab Results  Component Value Date   NA 139 05/09/2017   K 4.1 05/09/2017   CL 105 05/09/2017   CO2 26 05/09/2017   GLUCOSE 93 05/09/2017   BUN 17 05/09/2017   CREATININE 1.11 05/09/2017   CALCIUM 9.0 05/09/2017   No results found for: INR No results found for: CHOL, HDL, LDLCALC, TRIG   GEN- The patient is well appearing, alert and oriented x 3 today.   Head- normocephalic, atraumatic Eyes-  Sclera clear, conjunctiva pink Ears- hearing  intact Oropharynx- clear Neck- supple, no JVP Lymph- no cervical lymphadenopathy Lungs- Clear to ausculation bilaterally, normal work of breathing Heart- regular rate and rhythm, no murmurs, rubs or gallops, PMI not laterally displaced GI- soft, NT, ND, + BS Extremities- no clubbing, cyanosis, or edema MS- no significant deformity or atrophy Skin- no rash or lesion Psych- euthymic mood, full affect Neuro- strength and sensation are intact  EKG- Sinus brady at 58 bpm, IRBBB, LAFB,  Pr int 182 ms qrs 112 ms qtc 428 ms Epic records reviewed    Assessment and Plan: 1. Persistent afib Successful cardioversion Continue flecainide 100 mg bid Continue apixaban 5 mg bid, reminded not to miss doses Encouraged use of cpap  F/u with Dr. Eden EmmsNishan in 3 months afib clinic as needed  Lupita LeashDonna C. Matthew Folksarroll, ANP-C [facility] [address] [place], [place] [address] [phone]  Bmt/cbc/tsh today

## 2017-07-04 ENCOUNTER — Encounter (HOSPITAL_COMMUNITY): Payer: Self-pay | Admitting: Nurse Practitioner

## 2017-07-04 ENCOUNTER — Ambulatory Visit (HOSPITAL_COMMUNITY)
Admission: RE | Admit: 2017-07-04 | Discharge: 2017-07-04 | Disposition: A | Discharge status: Home / Self Care | Payer: BC Managed Care – PPO | Source: Ambulatory Visit | Attending: Nurse Practitioner | Admitting: Nurse Practitioner

## 2017-07-04 VITALS — BP 110/80 | HR 71 | Ht 68.0 in | Wt 204.4 lb

## 2017-07-04 DIAGNOSIS — I480 Paroxysmal atrial fibrillation: Secondary | ICD-10-CM | POA: Diagnosis present

## 2017-07-04 DIAGNOSIS — E669 Obesity, unspecified: Secondary | ICD-10-CM | POA: Insufficient documentation

## 2017-07-04 DIAGNOSIS — K219 Gastro-esophageal reflux disease without esophagitis: Secondary | ICD-10-CM | POA: Insufficient documentation

## 2017-07-04 DIAGNOSIS — I10 Essential (primary) hypertension: Secondary | ICD-10-CM | POA: Insufficient documentation

## 2017-07-04 DIAGNOSIS — I481 Persistent atrial fibrillation: Secondary | ICD-10-CM

## 2017-07-04 DIAGNOSIS — Z8673 Personal history of transient ischemic attack (TIA), and cerebral infarction without residual deficits: Secondary | ICD-10-CM | POA: Insufficient documentation

## 2017-07-04 DIAGNOSIS — G4733 Obstructive sleep apnea (adult) (pediatric): Secondary | ICD-10-CM | POA: Diagnosis not present

## 2017-07-04 DIAGNOSIS — Z7901 Long term (current) use of anticoagulants: Secondary | ICD-10-CM | POA: Insufficient documentation

## 2017-07-04 DIAGNOSIS — Z8249 Family history of ischemic heart disease and other diseases of the circulatory system: Secondary | ICD-10-CM | POA: Diagnosis not present

## 2017-07-04 DIAGNOSIS — Z79899 Other long term (current) drug therapy: Secondary | ICD-10-CM | POA: Insufficient documentation

## 2017-07-04 DIAGNOSIS — I4819 Other persistent atrial fibrillation: Secondary | ICD-10-CM

## 2017-07-04 DIAGNOSIS — Z6831 Body mass index (BMI) 31.0-31.9, adult: Secondary | ICD-10-CM | POA: Diagnosis not present

## 2017-07-04 DIAGNOSIS — E785 Hyperlipidemia, unspecified: Secondary | ICD-10-CM | POA: Diagnosis not present

## 2017-07-04 NOTE — Progress Notes (Addendum)
Primary Care Physician: Marcell Angerlark, Candice P, NP Referring Physician: Dr. Fletcher AnonNishan   Tanner Rich is a 61 y.o. male with a h/o paroxysmal afib that was recently see by Dr. Jasper LoserNisahn 4/19 and found to be in persistent afib . He has a chadsvasc score of at least 3 and was taking eliquis 5 mg qd instead of bid. He was asked to  Increase flecainide to 100 mg bid and to take eliquis correctly bid. In the afib clinic today, he remains in afib despite increase in flecainide. He has been on correct does of flecainide for at least the last 3 weeks. He has had previous cardioversion's and is wanting to proceed with cardioversion as per Dr. Fabio BeringNishan's plan. He is not consistent in use of cpap for sleep apnea.  F/u successful cardioversion 5/16 and is staying in SR. States compliance with flecainide and eliquis. Sees some improvement in energy.  Asked to be seen in afib clinic 7/9. He did not feel well yesterday but was working out in the heat.His wife checked BP and machine registered slightly irregular heart rate in the 60's. He feels better today and his  EKG shows SR at 71 bpm. He was not aware of an irregular heart beat yesterday. He is compliant with flecainide.  Today, he denies symptoms of palpitations, chest pain, shortness of breath, orthopnea, PND, lower extremity edema, dizziness, presyncope, syncope, or neurologic sequela. The patient is tolerating medications without difficulties and is otherwise without complaint today.   Past Medical History:  Diagnosis Date  . A-fib (HCC)   . CVA (cerebral vascular accident) (HCC)   . GERD (gastroesophageal reflux disease)   . HLD (hyperlipidemia)   . HTN (hypertension)   . Obesity   . OSA (obstructive sleep apnea)   . OSA (obstructive sleep apnea) 05/07/2015   Past Surgical History:  Procedure Laterality Date  . CARDIOVERSION N/A 06/03/2015   Procedure: CARDIOVERSION;  Surgeon: Wendall StadePeter C Nishan, MD;  [facility];  Service: Cardiovascular;  Laterality:  N/A;  . CARDIOVERSION N/A 05/11/2017   Procedure: CARDIOVERSION;  Surgeon: Wendall StadeNishan, Peter C, MD;  [facility];  Service: Cardiovascular;  Laterality: N/A;  . TONSILLECTOMY      Current Outpatient Prescriptions  Medication Sig Dispense Refill  . apixaban (ELIQUIS) 5 MG TABS tablet Take 5 mg by mouth 2 (two) times daily.     . enalapril (VASOTEC) 20 MG tablet Take 20 mg by mouth daily.    Marland Kitchen. escitalopram (LEXAPRO) 5 MG tablet Take 5 mg by mouth daily.    Marland Kitchen. esomeprazole (NEXIUM) 20 MG packet Take 20 mg by mouth daily before breakfast.    . flecainide (TAMBOCOR) 100 MG tablet Take 1 tablet (100 mg total) by mouth 2 (two) times daily. 180 tablet 3  . pravastatin (PRAVACHOL) 20 MG tablet Take 20 mg by mouth daily.     No current facility-administered medications for this encounter.     No Known Allergies  Social History   Social History  . Marital status: Married    Spouse name: N/A  . Number of children: N/A  . Years of education: N/A   Occupational History  . Not on file.   Social History Main Topics  . Smoking status: Never Smoker  . Smokeless tobacco: Never Used  . Alcohol use No  . Drug use: No  . Sexual activity: Not on file   Other Topics Concern  . Not on file   Social History Narrative  . No narrative  on file    Family History  Problem Relation Age of Onset  . Atrial fibrillation Mother   . Cancer Father        BONE  . Heart disease Father   . Prostate cancer Unknown     ROS- All systems are reviewed and negative except as per the HPI above  Physical Exam: Vitals:   07/04/17 1520  BP: 110/80  Pulse: 71  Weight: 204 lb 6.4 oz (92.7 kg)  Height: 5\' 8"  (1.727 m)   Wt Readings from Last 3 Encounters:  07/04/17 204 lb 6.4 oz (92.7 kg)  05/20/17 204 lb 6.4 oz (92.7 kg)  05/11/17 205 lb (93 kg)    Labs: Lab Results  Component Value Date   NA 139 05/09/2017   K 4.1 05/09/2017   CL 105 05/09/2017   CO2 26 05/09/2017   GLUCOSE 93  05/09/2017   BUN 17 05/09/2017   CREATININE 1.11 05/09/2017   CALCIUM 9.0 05/09/2017   No results found for: INR No results found for: CHOL, HDL, LDLCALC, TRIG   GEN- The patient is well appearing, alert and oriented x 3 today.   Head- normocephalic, atraumatic Eyes-  Sclera clear, conjunctiva pink Ears- hearing intact Oropharynx- clear Neck- supple, no JVP Lymph- no cervical lymphadenopathy Lungs- Clear to ausculation bilaterally, normal work of breathing Heart- regular rate and rhythm, no murmurs, rubs or gallops, PMI not laterally displaced GI- soft, NT, ND, + BS Extremities- no clubbing, cyanosis, or edema MS- no significant deformity or atrophy Skin- no rash or lesion Psych- euthymic mood, full affect Neuro- strength and sensation are intact  EKG- Sinus brady  RBBB, pr int 146 ms, qrs int 116 ms, qtc 423 ms, RBBB, LAFB  Epic records reviewed    Assessment and Plan: 1. Persistent afib Successful cardioversion 5/16 Pt reassured Continue flecainide 100 mg bid, but will review ekg with Dr. Johney Frame to see if flecainide dose needs adjustment with LAFB on higher dose Continue apixaban 5 mg bid for chadsvasc score of at least 3 He states that he is using cpap now  F/u with Dr. Eden Emms in 3 months afib clinic as needed  Addendum-7/10- reviewed EKG with Dr. Johney Frame and he felt intervals were acceptable with current dose of flecainide, no change needed  Lupita Leash C. Matthew Folks [facility] [address] [place], [place] [address] [phone]

## 2017-07-05 NOTE — Addendum Note (Signed)
Encounter addended by: Newman Niparroll, Rianna Lukes C, NP on: 07/05/2017  1:26 PM<BR>    Actions taken: Sign clinical note

## 2018-03-27 ENCOUNTER — Encounter (HOSPITAL_COMMUNITY): Payer: Self-pay | Admitting: Nurse Practitioner

## 2018-03-27 ENCOUNTER — Ambulatory Visit (HOSPITAL_COMMUNITY)
Admission: RE | Admit: 2018-03-27 | Discharge: 2018-03-27 | Disposition: A | Discharge status: Home / Self Care | Payer: BC Managed Care – PPO | Source: Ambulatory Visit | Attending: Nurse Practitioner | Admitting: Nurse Practitioner

## 2018-03-27 VITALS — BP 118/74 | HR 66 | Ht 68.0 in | Wt 212.6 lb

## 2018-03-27 DIAGNOSIS — K219 Gastro-esophageal reflux disease without esophagitis: Secondary | ICD-10-CM | POA: Diagnosis not present

## 2018-03-27 DIAGNOSIS — I10 Essential (primary) hypertension: Secondary | ICD-10-CM | POA: Diagnosis not present

## 2018-03-27 DIAGNOSIS — I452 Bifascicular block: Secondary | ICD-10-CM | POA: Diagnosis not present

## 2018-03-27 DIAGNOSIS — Z7901 Long term (current) use of anticoagulants: Secondary | ICD-10-CM | POA: Insufficient documentation

## 2018-03-27 DIAGNOSIS — E785 Hyperlipidemia, unspecified: Secondary | ICD-10-CM | POA: Diagnosis not present

## 2018-03-27 DIAGNOSIS — I481 Persistent atrial fibrillation: Secondary | ICD-10-CM | POA: Insufficient documentation

## 2018-03-27 DIAGNOSIS — G4733 Obstructive sleep apnea (adult) (pediatric): Secondary | ICD-10-CM | POA: Diagnosis not present

## 2018-03-27 DIAGNOSIS — I4510 Unspecified right bundle-branch block: Secondary | ICD-10-CM | POA: Insufficient documentation

## 2018-03-27 DIAGNOSIS — I480 Paroxysmal atrial fibrillation: Secondary | ICD-10-CM

## 2018-03-27 DIAGNOSIS — I444 Left anterior fascicular block: Secondary | ICD-10-CM | POA: Insufficient documentation

## 2018-03-27 DIAGNOSIS — I4891 Unspecified atrial fibrillation: Secondary | ICD-10-CM | POA: Diagnosis present

## 2018-03-27 DIAGNOSIS — E669 Obesity, unspecified: Secondary | ICD-10-CM | POA: Insufficient documentation

## 2018-03-27 DIAGNOSIS — Z79899 Other long term (current) drug therapy: Secondary | ICD-10-CM | POA: Diagnosis not present

## 2018-03-27 NOTE — Progress Notes (Signed)
Primary Care Physician: Marcell Anger, NP Referring Physician: Dr. [name] is a 62 y.o. male with a h/o paroxysmal afib that was recently see by Dr. [name] 04/14/17 and found to be in persistent afib . He has a chadsvasc score of at least 3 and was taking eliquis 5 mg qd instead of bid. He was asked to  Increase flecainide to 100 mg bid and to take eliquis correctly bid. In the afib clinic today, he remains in afib despite increase in flecainide. He has been on correct does of flecainide for at least the last 3 weeks. He has had previous cardioversion's and is wanting to proceed with cardioversion as per Dr. [name] plan. He is not consistent in use of cpap for sleep apnea.  F/u successful cardioversion 05/11/17 and is staying in SR. States compliance with flecainide and eliquis. Sees some improvement in energy.  F/u afib clinic 03/27/18. He continues to take flecainide and eliquis bid. He is having some breakthrough afib but is not bothered enough that he wants to move on to ablation or change in medication. He has gained around 8 lbs, works in [employer] and has been less busywith the rainy weather. He stopped wearing cpap six months ago and is drinking 3 mugs of coffee in the am.   Today, he denies symptoms of palpitations, chest pain, shortness of breath, orthopnea, PND, lower extremity edema, dizziness, presyncope, syncope, or neurologic sequela. The patient is tolerating medications without difficulties and is otherwise without complaint today.   Past Medical History:  Diagnosis Date  . A-fib (HCC)   . CVA (cerebral vascular accident) (HCC)   . GERD (gastroesophageal reflux disease)   . HLD (hyperlipidemia)   . HTN (hypertension)   . Obesity   . OSA (obstructive sleep apnea)   . OSA (obstructive sleep apnea) 05/07/2015   Past Surgical History:  Procedure Laterality Date  . CARDIOVERSION N/A 06/03/2015   Procedure: CARDIOVERSION;  Surgeon: Wendall Stade, MD;   Location: [facility] ENDOSCOPY;  Service: Cardiovascular;  Laterality: N/A;  . CARDIOVERSION N/A 05/11/2017   Procedure: CARDIOVERSION;  Surgeon: Wendall Stade, MD;  Location: [facility] ENDOSCOPY;  Service: Cardiovascular;  Laterality: N/A;  . TONSILLECTOMY      Current Outpatient Medications  Medication Sig Dispense Refill  . apixaban (ELIQUIS) 5 MG TABS tablet Take 5 mg by mouth 2 (two) times daily.     . enalapril (VASOTEC) 20 MG tablet Take 20 mg by mouth daily.    Marland Kitchen esomeprazole (NEXIUM) 20 MG packet Take 20 mg by mouth daily before breakfast.    . flecainide (TAMBOCOR) 100 MG tablet Take 1 tablet (100 mg total) by mouth 2 (two) times daily. 180 tablet 3   No current facility-administered medications for this encounter.     No Known Allergies  Social History   Socioeconomic History  . Marital status: Married    Spouse name: Not on file  . Number of children: Not on file  . Years of education: Not on file  . Highest education level: Not on file  Occupational History  . Not on file  Social Needs  . Financial resource strain: Not on file  . Food insecurity:    Worry: Not on file    Inability: Not on file  . Transportation needs:    Medical: Not on file    Non-medical: Not on file  Tobacco Use  . Smoking status: Never Smoker  . Smokeless tobacco: Never Used  Substance and Sexual Activity  . Alcohol use: No    Alcohol/week: 0.0 oz  . Drug use: No  . Sexual activity: Not on file  Lifestyle  . Physical activity:    Days per week: Not on file    Minutes per session: Not on file  . Stress: Not on file  Relationships  . Social connections:    Talks on phone: Not on file    Gets together: Not on file    Attends religious service: Not on file    Active member of club or organization: Not on file    Attends meetings of clubs or organizations: Not on file    Relationship status: Not on file  . Intimate partner violence:    Fear of current or ex partner: Not on file    Emotionally  abused: Not on file    Physically abused: Not on file    Forced sexual activity: Not on file  Other Topics Concern  . Not on file  Social History Narrative  . Not on file    Family History  Problem Relation Age of Onset  . Atrial fibrillation Mother   . Cancer Father        BONE  . Heart disease Father   . Prostate cancer Unknown     ROS- All systems are reviewed and negative except as per the HPI above  Physical Exam: Vitals:   03/27/18 0837  BP: 118/74  Pulse: 66  Weight: 212 lb 9.6 oz (96.4 kg)  Height: 5\' 8"  (1.727 m)   Wt Readings from Last 3 Encounters:  03/27/18 212 lb 9.6 oz (96.4 kg)  07/04/17 204 lb 6.4 oz (92.7 kg)  05/20/17 204 lb 6.4 oz (92.7 kg)    Labs: Lab Results  Component Value Date   NA 139 05/09/2017   K 4.1 05/09/2017   CL 105 05/09/2017   CO2 26 05/09/2017   GLUCOSE 93 05/09/2017   BUN 17 05/09/2017   CREATININE 1.11 05/09/2017   CALCIUM 9.0 05/09/2017   No results found for: INR No results found for: CHOL, HDL, LDLCALC, TRIG   GEN- The patient is well appearing, alert and oriented x 3 today.   Head- normocephalic, atraumatic Eyes-  Sclera clear, conjunctiva pink Ears- hearing intact Oropharynx- clear Neck- supple, no JVP Lymph- no cervical lymphadenopathy Lungs- Clear to ausculation bilaterally, normal work of breathing Heart- regular rate and rhythm, no murmurs, rubs or gallops, PMI not laterally displaced GI- soft, NT, ND, + BS Extremities- no clubbing, cyanosis, or edema MS- no significant deformity or atrophy Skin- no rash or lesion Psych- euthymic mood, full affect Neuro- strength and sensation are intact  EKG- Sinus brady at 66 bpm, RBBB, LAFB,  Pr int 172 ms qrs 124 ms qtc 413 ms Epic records reviewed    Assessment and Plan: 1. Persistent afib For most part is staying in SR, some breakthrough afib but pt finds acceptable Continue flecainide 100 mg bid Continue apixaban 5 mg bid, reminded not to miss doses,  chadsvasc score of 3  2. Lifestyle issues He has gained around 8 lbs, encouraged calorie reduction and increase in exercise  Encouraged use of cpap as can undermine SR, pt has not used x 6 months Reduce use of caffeine   F/u with Dr. Eden EmmsNishan per recall in August afib clinic as needed  Elvina SidleDonna C. Matthew Folksarroll, ANP-C [facility] Clinic [facility] [address] [place], [place] [address] [phone]  Bmt/cbc/tsh today

## 2018-10-12 DIAGNOSIS — I679 Cerebrovascular disease, unspecified: Secondary | ICD-10-CM | POA: Insufficient documentation

## 2018-10-12 DIAGNOSIS — R7303 Prediabetes: Secondary | ICD-10-CM | POA: Insufficient documentation

## 2018-10-12 DIAGNOSIS — M109 Gout, unspecified: Secondary | ICD-10-CM | POA: Insufficient documentation

## 2019-01-02 NOTE — Progress Notes (Signed)
Patient ID: Tanner Rich, male   DOB: 1956/09/06, 63 y.o.   MRN: [id number]     Cardiology Office Note   Date:  01/04/2019   ID:  Tanner Rich, DOB 1956/09/06, MRN [id number]  PCP:  Marcell Angerlark, Candice P, NP  Cardiologist:   Charlton HawsPeter Presleigh Feldstein, MD   No chief complaint on file.     History of Present Illness: Tanner Rich is a 63 y.o. male who presented for f/u PAF first diagnosed in 2015 .  History of RICA occlusion and CVA in 2000.  OSA not always CPAP compliant. [facility] and started on flecainide 04/23/15 with conversion and for most part limited breakthrough. Had one Hemorid  bleed but no other issues on anticoagulation   Normal ETT 04/27/17 Duplex 04/27/16 RICA occluded no stenosis on left   Having atypical chest pains Most resting Tightness in left chest. Associated with  Fatigue but not palpitations   Past Medical History:  Diagnosis Date  . A-fib (HCC)   . CVA (cerebral vascular accident) (HCC)   . GERD (gastroesophageal reflux disease)   . HLD (hyperlipidemia)   . HTN (hypertension)   . Obesity   . OSA (obstructive sleep apnea)   . OSA (obstructive sleep apnea) 05/07/2015    Past Surgical History:  Procedure Laterality Date  . CARDIOVERSION N/A 06/03/2015   Procedure: CARDIOVERSION;  Surgeon: Wendall StadePeter C Kalel Harty, MD;  [facility];  Service: Cardiovascular;  Laterality: N/A;  . CARDIOVERSION N/A 05/11/2017   Procedure: CARDIOVERSION;  Surgeon: Wendall StadeNishan, Isabela Nardelli C, MD;  [facility];  Service: Cardiovascular;  Laterality: N/A;  . TONSILLECTOMY       Current Outpatient Medications  Medication Sig Dispense Refill  . apixaban (ELIQUIS) 5 MG TABS tablet Take 5 mg by mouth 2 (two) times daily.     . enalapril (VASOTEC) 20 MG tablet Take 20 mg by mouth daily.    Marland Kitchen. esomeprazole (NEXIUM) 20 MG packet Take 20 mg by mouth daily before breakfast.    . flecainide (TAMBOCOR) 100 MG tablet Take 1 tablet (100 mg total) by mouth 2 (two) times daily. 180  tablet 3   No current facility-administered medications for this visit.     Allergies:   Patient has no known allergies.    Social History:  The patient  reports that he has never smoked. He has never used smokeless tobacco. He reports that he does not drink alcohol or use drugs.   Family History:  The patient's family history includes Atrial fibrillation in his mother; Cancer in his father; Heart disease in his father; Prostate cancer in an other family member.    ROS:  Please see the history of present illness.   Otherwise, review of systems are positive for none.   All other systems are reviewed and negative.    PHYSICAL EXAM: VS:  BP 126/86   Pulse 72   Ht 5\' 8"  (1.727 m)   Wt 213 lb (96.6 kg)   SpO2 97%   BMI 32.39 kg/m  , BMI Body mass index is 32.39 kg/m. Affect appropriate Healthy:  appears stated age HEENT: normal Neck supple with no adenopathy JVP normal no bruits no thyromegaly Lungs clear with no wheezing and good diaphragmatic motion Heart:  S1/S2 no murmur, no rub, gallop or click PMI normal Abdomen: benighn, BS positve, no tenderness, no AAA no bruit.  No HSM or HJR Distal pulses intact with no bruits No edema Neuro non-focal Skin warm  and dry No muscular weakness    EKG:    04/14/17  afib rate 83  ICRBBB   Recent Labs: No results found for requested labs within last 8760 hours.    Lipid Panel No results found for: CHOL, TRIG, HDL, CHOLHDL, VLDL, LDLCALC, LDLDIRECT    Wt Readings from Last 3 Encounters:  01/04/19 213 lb (96.6 kg)  03/27/18 212 lb 9.6 oz (96.4 kg)  07/04/17 204 lb 6.4 oz (92.7 kg)      Other studies Reviewed: Additional studies/ records that were reviewed today include: [facility]  See HPO.    ASSESSMENT AND PLAN:  1.  Afib: reasonable control on flecainide patient does not want referral to EP for ablation consideration continue as well as DOAC 2. OSA  Continue CPAP f/u Dr Mayford Knifeurner  3. Chol:   Continue statin given history of stoke  F/u labs primary 4. CVA:  2000 lytics and indicates artery opened ? Thrombotic ICA occlusion  Left ICA wide open F/u duplex ordered to r/o progressive left sided disease  5. Chest Pain: atypical f/u exercise myovue    Current medicines are reviewed at length with the patient today.  The patient does not have concerns regarding medicines.  The following changes have been made:   Labs/ tests ordered today include:      No orders of the defined types were placed in this encounter.    Disposition:   FU with me in a year     Signed, Charlton HawsPeter Verlyn Lambert, MD  01/04/2019 8:09 AM    [facility] [address], [place], [place]  [address] Phone: [phone]; Fax: [phone]

## 2019-01-04 ENCOUNTER — Ambulatory Visit (INDEPENDENT_AMBULATORY_CARE_PROVIDER_SITE_OTHER): Payer: Medicare Other | Admitting: Cardiovascular Disease

## 2019-01-04 ENCOUNTER — Encounter: Payer: Self-pay | Admitting: Cardiovascular Disease

## 2019-01-04 ENCOUNTER — Encounter (INDEPENDENT_AMBULATORY_CARE_PROVIDER_SITE_OTHER): Payer: Self-pay

## 2019-01-04 VITALS — BP 126/86 | HR 72 | Ht 68.0 in | Wt 213.0 lb

## 2019-01-04 DIAGNOSIS — I6521 Occlusion and stenosis of right carotid artery: Secondary | ICD-10-CM

## 2019-01-04 DIAGNOSIS — I480 Paroxysmal atrial fibrillation: Secondary | ICD-10-CM

## 2019-01-04 DIAGNOSIS — R079 Chest pain, unspecified: Secondary | ICD-10-CM | POA: Diagnosis not present

## 2019-01-04 NOTE — Patient Instructions (Signed)
Medication Instructions:   If you need a refill on your cardiac medications before your next appointment, please call your pharmacy.   Lab work:  If you have labs (blood work) drawn today and your tests are completely normal, you will receive your results only by: Marland Kitchen MyChart Message (if you have MyChart) OR . A paper copy in the mail If you have any lab test that is abnormal or we need to change your treatment, we will call you to review the results.  Testing/Procedures: Your physician has requested that you have a carotid duplex. This test is an ultrasound of the carotid arteries in your neck. It looks at blood flow through these arteries that supply the brain with blood. Allow one hour for this exam. There are no restrictions or special instructions.  Your physician has requested that you have en exercise stress myoview. For further information please visit [email or web address] Please follow instruction sheet, as given.  Follow-Up: At [facility], you and your health needs are our priority.  As part of our continuing mission to provide you with exceptional heart care, we have created designated Provider Care Teams.  These Care Teams include your primary Cardiologist (physician) and Advanced Practice Providers (APPs -  Physician Assistants and Nurse Practitioners) who all work together to provide you with the care you need, when you need it. You will need a follow up appointment in 12 months.  Please call our office 2 months in advance to schedule this appointment.  You may see Dr. Eden Emms or one of the following Advanced Practice Providers on your designated Care Team:   Norma Fredrickson, NP Nada Boozer, NP . Georgie Chard, NP

## 2019-01-09 ENCOUNTER — Telehealth (HOSPITAL_COMMUNITY): Payer: Self-pay

## 2019-01-09 NOTE — Telephone Encounter (Signed)
Contacted the patient with his instructions for his stress test. Pt stated that he understood and would be here. [name] EMTP

## 2019-01-11 ENCOUNTER — Ambulatory Visit (HOSPITAL_BASED_OUTPATIENT_CLINIC_OR_DEPARTMENT_OTHER): Payer: Medicare Other

## 2019-01-11 ENCOUNTER — Ambulatory Visit (HOSPITAL_COMMUNITY)
Admission: RE | Admit: 2019-01-11 | Discharge: 2019-01-11 | Disposition: A | Discharge status: Home / Self Care | Payer: Medicare Other | Source: Ambulatory Visit | Attending: Cardiology | Admitting: Cardiology

## 2019-01-11 DIAGNOSIS — I6521 Occlusion and stenosis of right carotid artery: Secondary | ICD-10-CM

## 2019-01-11 DIAGNOSIS — I480 Paroxysmal atrial fibrillation: Secondary | ICD-10-CM

## 2019-01-11 DIAGNOSIS — R079 Chest pain, unspecified: Secondary | ICD-10-CM

## 2019-01-11 LAB — MYOCARDIAL PERFUSION IMAGING
CHL CUP NUCLEAR SDS: 1
CHL CUP RESTING HR STRESS: 59 {beats}/min
LV sys vol: 41 mL
LVDIAVOL: 92 mL (ref 62–150)
NUC STRESS TID: 0.9
Peak HR: 88 {beats}/min
SRS: 0
SSS: 1

## 2019-01-11 MED ORDER — TECHNETIUM TC 99M TETROFOSMIN IV KIT
10.7000 | PACK | Freq: Once | INTRAVENOUS | Status: AC | PRN
  Administered 2019-01-11: 10.7 via INTRAVENOUS
  Filled 2019-01-11: qty 11

## 2019-01-11 MED ORDER — REGADENOSON 0.4 MG/5ML IV SOLN
0.4000 mg | Freq: Once | INTRAVENOUS | Status: AC
  Administered 2019-01-11: 0.4 mg via INTRAVENOUS

## 2019-01-11 MED ORDER — TECHNETIUM TC 99M TETROFOSMIN IV KIT
32.4000 | PACK | Freq: Once | INTRAVENOUS | Status: AC | PRN
  Administered 2019-01-11: 32.4 via INTRAVENOUS
  Filled 2019-01-11: qty 33

## 2019-01-12 ENCOUNTER — Telehealth: Payer: Self-pay

## 2019-01-12 DIAGNOSIS — I6521 Occlusion and stenosis of right carotid artery: Secondary | ICD-10-CM

## 2019-01-12 NOTE — Telephone Encounter (Signed)
Patient aware of carotid results. Per Dr. Eden Emms, known RICA occlusion left side fine f/u duplex in a year. Patient verbalized understanding. Will place order for carotid for patient to have done in a year.

## 2019-01-12 NOTE — Telephone Encounter (Signed)
-----   Message from Wendall Stade, MD sent at 01/12/2019  8:06 AM EST ----- Known RICA occlusion left side fine f/u duplex in a year

## 2019-06-22 DIAGNOSIS — Z8042 Family history of malignant neoplasm of prostate: Secondary | ICD-10-CM | POA: Insufficient documentation

## 2019-06-22 DIAGNOSIS — K649 Unspecified hemorrhoids: Secondary | ICD-10-CM | POA: Insufficient documentation

## 2019-07-13 ENCOUNTER — Telehealth: Payer: Self-pay | Admitting: *Deleted

## 2019-07-13 NOTE — Telephone Encounter (Signed)
Patient with diagnosis of afib on Eliquis for anticoagulation.    Procedure: SCREENING COLONOSCOPY Date of procedure: 07/24/2019  CHADS2-VASc score of  4 (HTN, AGE, stroke/tia x 2)  CrCl 24ml/min  Per office protocol, patient can hold Eliquis for 1 day prior to procedure.

## 2019-07-13 NOTE — Telephone Encounter (Signed)
Left message to call back.  Kerin Ransom PA-C 07/13/2019 3:50 PM

## 2019-07-13 NOTE — Telephone Encounter (Signed)
   [facility] Pre-operative Risk Assessment    Request for surgical clearance:  1. What type of surgery is being performed? SCREENING COLONOSCOPY  2. When is this surgery scheduled? 07/24/19  3. What type of clearance is required (medical clearance vs. Pharmacy clearance to hold med vs. Both)? BOTH  4. Are there any medications that need to be held prior to surgery and how long? ELIQUIS 5. Practice name and name of physician performing surgery? [facility]; DR. Nelma Rothman BITAR  6. What is your office phone number [phone]   7.   What is your office fax number [phone]  8.   Anesthesia type (None, local, MAC, general) ? PROPOFOL ? ANESTHESIA NOT LISTED   Julaine Hua 07/13/2019, 2:50 PM  _________________________________________________________________   (provider comments below)

## 2019-07-16 NOTE — Telephone Encounter (Signed)
LMTCB

## 2019-07-17 ENCOUNTER — Telehealth: Payer: Self-pay | Admitting: Cardiovascular Disease

## 2019-07-17 NOTE — Telephone Encounter (Signed)
Follow up  ° ° °Patient is returning call.  °

## 2019-07-17 NOTE — Telephone Encounter (Signed)
LMTCB 07/17/2019 1610

## 2019-07-17 NOTE — Telephone Encounter (Signed)
I saw you tried to call him. I just was sending this to you as FYI. Maybe Preop Call back can call Olivia Mackie and let her know you are trying to reach the pt.

## 2019-07-17 NOTE — Telephone Encounter (Signed)
Tanner Rich  I tried calling this patient yesterday without and answer. Dur to the time it has been since he saw Dr. Johnsie Cancel, I would need to at least talk to him on the phone

## 2019-07-17 NOTE — Telephone Encounter (Signed)
Patient returned your call. Please call back [phone]

## 2019-07-17 NOTE — Telephone Encounter (Signed)
New Message     Tanner Rich is calling and would like for the Medical Clearance information to be faxed to them  Please fax to  [phone]

## 2019-07-17 NOTE — Telephone Encounter (Signed)
    Attempted to call patient once again for pre-procedure clearance however with no response. Requesting office is asking for clearance information. Given the time that it has been since he was seen by Dr. Johnsie Cancel, we will need to at least speak to him over the phone to assess symptomology. Will have pre-op team call requesting office Olivia Mackie) to let them know that we have not been able to get in touch with the patient for several attempts now.    Kathyrn Drown NP-C [place] Pager: [phone]

## 2019-07-17 NOTE — Telephone Encounter (Signed)
Spoke with Olivia Mackie from Dr. Marian Sorrow office and informed her that we have made several attempts to reach the patient but no returned calls. Olivia Mackie states she will try to reach patient and have him call our office.

## 2019-07-18 NOTE — Telephone Encounter (Signed)
   Primary Cardiologist: Jenkins Rouge, MD  Chart reviewed as part of pre-operative protocol coverage. Patient was contacted 07/18/2019 in reference to pre-operative risk assessment for pending surgery as outlined below.  Tanner Rich was last seen on 01/04/2019 by Dr. Johnsie Cancel.  Since that day, Tanner Rich has done well from a cardiac perspective. He states that he has had only 2-3 episodes of atrial fibrillation over the course of 6 months. Denies SOB or chest pain. Continues to work without complication.   He has a diagnosis of afib on Eliquis for anticoagulation.    Procedure: SCREENING COLONOSCOPY Date of procedure: 07/24/2019  CHADS2-VASc score of  4 (HTN, AGE, stroke/tia x 2)  CrCl 32ml/min  Per office protocol and pharmacy recommendations, patient can hold Eliquis for 1 day prior to procedure and should resume as soon as possible after per procedure team.   Therefore, based on ACC/AHA guidelines, the patient would be at acceptable risk for the planned procedure without further cardiovascular testing.   I will route this recommendation to the requesting party via Epic fax function and remove from pre-op pool.  Please call with questions.  Kathyrn Drown, NP 07/18/2019, 11:37 AM

## 2019-08-04 DIAGNOSIS — E785 Hyperlipidemia, unspecified: Secondary | ICD-10-CM | POA: Insufficient documentation

## 2020-01-14 ENCOUNTER — Encounter (INDEPENDENT_AMBULATORY_CARE_PROVIDER_SITE_OTHER): Payer: Self-pay

## 2020-01-14 ENCOUNTER — Ambulatory Visit (HOSPITAL_COMMUNITY)
Admission: RE | Admit: 2020-01-14 | Discharge: 2020-01-14 | Disposition: A | Discharge status: Home / Self Care | Payer: Medicare Other | Source: Ambulatory Visit | Attending: Cardiovascular Disease | Admitting: Cardiovascular Disease

## 2020-01-14 ENCOUNTER — Other Ambulatory Visit (HOSPITAL_COMMUNITY): Payer: Self-pay | Admitting: Cardiovascular Disease

## 2020-01-14 ENCOUNTER — Telehealth: Payer: Self-pay

## 2020-01-14 ENCOUNTER — Other Ambulatory Visit: Payer: Self-pay

## 2020-01-14 DIAGNOSIS — I6521 Occlusion and stenosis of right carotid artery: Secondary | ICD-10-CM

## 2020-01-14 NOTE — Telephone Encounter (Signed)
Patient aware of results. Will place order for carotid in one year. 

## 2020-01-14 NOTE — Telephone Encounter (Signed)
-----   Message from Wendall Stade, MD sent at 01/14/2020 10:46 AM EST ----- Known right ICA occlusion left side fine f/u duplex in a year

## 2020-04-07 ENCOUNTER — Telehealth: Payer: Self-pay | Admitting: Cardiovascular Disease

## 2020-04-07 NOTE — Telephone Encounter (Signed)
Patient is having A. FIB episodes more often. Patient stated these episode usually come and then just go away. Patient stated they are coming more often. Made patient an appointment with A. Fib clinic to be evaluated, since he has not been seen in a while. Patient verbalized understanding and will keep appointment for tomorrow with Rudi Coco NP.

## 2020-04-07 NOTE — Telephone Encounter (Signed)
Patient c/o Palpitations:  High priority if patient c/o lightheadedness, shortness of breath, or chest pain  1) How long have you had palpitations/irregular HR/ Afib? Are you having the symptoms now? afib - yes happening now  2) Are you currently experiencing lightheadedness, SOB or CP? no  3) Do you have a history of afib (atrial fibrillation) or irregular heart rhythm? Yes afib  4) Have you checked your BP or HR? (document readings if available): no  5) Are you experiencing any other symptoms? No energy or strength.

## 2020-04-08 ENCOUNTER — Other Ambulatory Visit (HOSPITAL_COMMUNITY)
Admission: RE | Admit: 2020-04-08 | Discharge: 2020-04-08 | Disposition: A | Discharge status: Home / Self Care | Payer: Medicare Other | Source: Ambulatory Visit | Attending: Internal Medicine | Admitting: Internal Medicine

## 2020-04-08 ENCOUNTER — Other Ambulatory Visit: Payer: Self-pay

## 2020-04-08 ENCOUNTER — Ambulatory Visit (HOSPITAL_COMMUNITY)
Admission: RE | Admit: 2020-04-08 | Discharge: 2020-04-08 | Disposition: A | Discharge status: Home / Self Care | Payer: Medicare Other | Source: Ambulatory Visit | Attending: Nurse Practitioner | Admitting: Nurse Practitioner

## 2020-04-08 ENCOUNTER — Encounter (HOSPITAL_COMMUNITY): Payer: Self-pay | Admitting: Nurse Practitioner

## 2020-04-08 VITALS — BP 107/76 | HR 137 | Ht 68.0 in | Wt 199.4 lb

## 2020-04-08 DIAGNOSIS — I4819 Other persistent atrial fibrillation: Secondary | ICD-10-CM | POA: Diagnosis not present

## 2020-04-08 DIAGNOSIS — Z20822 Contact with and (suspected) exposure to covid-19: Secondary | ICD-10-CM | POA: Diagnosis not present

## 2020-04-08 DIAGNOSIS — Z8673 Personal history of transient ischemic attack (TIA), and cerebral infarction without residual deficits: Secondary | ICD-10-CM | POA: Insufficient documentation

## 2020-04-08 DIAGNOSIS — G4733 Obstructive sleep apnea (adult) (pediatric): Secondary | ICD-10-CM | POA: Diagnosis not present

## 2020-04-08 DIAGNOSIS — E785 Hyperlipidemia, unspecified: Secondary | ICD-10-CM | POA: Insufficient documentation

## 2020-04-08 DIAGNOSIS — D6869 Other thrombophilia: Secondary | ICD-10-CM | POA: Diagnosis not present

## 2020-04-08 DIAGNOSIS — Z01818 Encounter for other preprocedural examination: Secondary | ICD-10-CM | POA: Diagnosis not present

## 2020-04-08 DIAGNOSIS — I10 Essential (primary) hypertension: Secondary | ICD-10-CM | POA: Diagnosis not present

## 2020-04-08 DIAGNOSIS — K219 Gastro-esophageal reflux disease without esophagitis: Secondary | ICD-10-CM | POA: Insufficient documentation

## 2020-04-08 DIAGNOSIS — I480 Paroxysmal atrial fibrillation: Secondary | ICD-10-CM | POA: Diagnosis not present

## 2020-04-08 DIAGNOSIS — Z7901 Long term (current) use of anticoagulants: Secondary | ICD-10-CM | POA: Insufficient documentation

## 2020-04-08 DIAGNOSIS — Z79899 Other long term (current) drug therapy: Secondary | ICD-10-CM | POA: Insufficient documentation

## 2020-04-08 DIAGNOSIS — Z9119 Patient's noncompliance with other medical treatment and regimen: Secondary | ICD-10-CM | POA: Insufficient documentation

## 2020-04-08 LAB — CBC
HCT: 50 % (ref 39.0–52.0)
Hemoglobin: 15.3 g/dL (ref 13.0–17.0)
MCH: 27.6 pg (ref 26.0–34.0)
MCHC: 30.6 g/dL (ref 30.0–36.0)
MCV: 90.3 fL (ref 80.0–100.0)
Platelets: 224 10*3/uL (ref 150–400)
RBC: 5.54 MIL/uL (ref 4.22–5.81)
RDW: 12.7 % (ref 11.5–15.5)
WBC: 6.3 10*3/uL (ref 4.0–10.5)
nRBC: 0 % (ref 0.0–0.2)

## 2020-04-08 LAB — BASIC METABOLIC PANEL
Anion gap: 9 (ref 5–15)
BUN: 16 mg/dL (ref 8–23)
CO2: 28 mmol/L (ref 22–32)
Calcium: 10.1 mg/dL (ref 8.9–10.3)
Chloride: 105 mmol/L (ref 98–111)
Creatinine, Ser: 1.43 mg/dL — ABNORMAL HIGH (ref 0.61–1.24)
GFR calc Af Amer: 59 mL/min — ABNORMAL LOW (ref >60)
GFR calc non Af Amer: 51 mL/min — ABNORMAL LOW (ref >60)
Glucose, Bld: 107 mg/dL — ABNORMAL HIGH (ref 70–99)
Potassium: 4.4 mmol/L (ref 3.5–5.1)
Sodium: 142 mmol/L (ref 135–145)

## 2020-04-08 LAB — SARS CORONAVIRUS 2 (TAT 6-24 HRS): SARS Coronavirus 2: NEGATIVE

## 2020-04-08 NOTE — H&P (View-Only) (Signed)
Primary Care Physician: Marcell Anger, NP Referring Physician: Dr. Fletcher Rich is a 64 y.o. male with a h/o paroxysmal afib that was recently see by Dr. [name] 04/14/17 and found to be in persistent afib . He has a chadsvasc score of at least 3 and was taking eliquis 5 mg qd instead of bid. He was asked to  Increase flecainide to 100 mg bid and to take eliquis correctly bid. In the afib clinic today, he remains in afib despite increase in flecainide. He has been on correct does of flecainide for at least the last 3 weeks. He has had previous cardioversion's and is wanting to proceed with cardioversion as per Dr. [name] plan. He is not consistent in use of cpap for sleep apnea.  F/u successful cardioversion 05/11/17 and is staying in SR. States compliance with flecainide and eliquis. Sees some improvement in energy.  F/u afib clinic 03/27/18. He continues to take flecainide and eliquis bid. He is having some breakthrough afib but is not bothered enough that he wants to move on to ablation or change in medication. 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 He stopped wearing cpap six months ago and is drinking 3 mugs of coffee in the am.   Asked to be seen in afib clinic, 04/08/20, for going into afib Friday. He has RVR with soft BP. He does not know of any  trigger. He does have untreated sleep apnea. Denies any alcohol or caffeine use. No missed apixaban for at least 3 weeks, with a CHA2DS2VASc score of 4.  Today, he denies symptoms of palpitations, chest pain, shortness of breath, orthopnea, PND, lower extremity edema, dizziness, presyncope, syncope, or neurologic sequela. The patient is tolerating medications without difficulties and is otherwise without complaint today.   Past Medical History:  Diagnosis Date  . A-fib (HCC)   . CVA (cerebral vascular accident) (HCC)   . GERD (gastroesophageal reflux disease)   . HLD  (hyperlipidemia)   . HTN (hypertension)   . Obesity   . OSA (obstructive sleep apnea)   . OSA (obstructive sleep apnea) 05/07/2015   Past Surgical History:  Procedure Laterality Date  . CARDIOVERSION N/A 06/03/2015   Procedure: CARDIOVERSION;  Surgeon: Wendall Stade, MD;  Location: [facility] ENDOSCOPY;  Service: Cardiovascular;  Laterality: N/A;  . CARDIOVERSION N/A 05/11/2017   Procedure: CARDIOVERSION;  Surgeon: Wendall Stade, MD;  Location: [facility] ENDOSCOPY;  Service: Cardiovascular;  Laterality: N/A;  . TONSILLECTOMY      Current Outpatient Medications  Medication Sig Dispense Refill  . apixaban (ELIQUIS) 5 MG TABS tablet Take 5 mg by mouth 2 (two) times daily.     . enalapril (VASOTEC) 20 MG tablet Take by mouth daily.     Marland Kitchen esomeprazole (NEXIUM) 20 MG packet Take 20 mg by mouth daily before breakfast.    . flecainide (TAMBOCOR) 100 MG tablet Take 1 tablet (100 mg total) by mouth 2 (two) times daily. 180 tablet 3  . tamsulosin (FLOMAX) 0.4 MG CAPS capsule Take 0.4 mg by mouth.    . Vitamin D, Ergocalciferol, (DRISDOL) 1.25 MG (50000 UNIT) CAPS capsule Take 50,000 Units by mouth every 30 (thirty) days.     No current facility-administered medications for this encounter.    Allergies  Allergen Reactions  . Ciprofloxacin Swelling    Pt states he took med for 3 days and ankle swelling started. Pt states he took  med for 3 days and ankle swelling started.     Social History   Socioeconomic History  . Marital status: Married    Spouse name: Not on file  . Number of children: Not on file  . Years of education: Not on file  . Highest education level: Not on file  Occupational History  . Not on file  Tobacco Use  . Smoking status: Never Smoker  . Smokeless tobacco: Never Used  Substance and Sexual Activity  . Alcohol use: No    Alcohol/week: 0.0 standard drinks  . Drug use: No  . Sexual activity: Not on file  Other Topics Concern  . Not on file  Social History Narrative  . Not  on file   Social Determinants of Health   Financial Resource Strain:   . Difficulty of Paying Living Expenses:   Food Insecurity:   . Worried About Running Out of Food in the Last Year:   . Ran Out of Food in the Last Year:   Transportation Needs:   . Lack of Transportation (Medical):   . Lack of Transportation (Non-Medical):   Physical Activity:   . Days of Exercise per Week:   . Minutes of Exercise per Session:   Stress:   . Feeling of Stress :   Social Connections:   . Frequency of Communication with Friends and Family:   . Frequency of Social Gatherings with Friends and Family:   . Attends Religious Services:   . Active Member of Clubs or Organizations:   . Attends Club or Organization Meetings:   . Marital Status:   Intimate Partner Violence:   . Fear of Current or Ex-Partner:   . Emotionally Abused:   . Physically Abused:   . Sexually Abused:     Family History  Problem Relation Age of Onset  . Atrial fibrillation Mother   . Cancer Father        BONE  . Heart disease Father   . Prostate cancer Other     ROS- All systems are reviewed and negative except as per the HPI above  Physical Exam: Vitals:   04/08/20 0840  BP: 107/76  Pulse: (!) 137  Weight: 90.4 kg  Height: 5' 8" (1.727 m)   Wt Readings from Last 3 Encounters:  04/08/20 90.4 kg  01/11/19 96.6 kg  01/04/19 96.6 kg    Labs: Lab Results  Component Value Date   NA 139 05/09/2017   K 4.1 05/09/2017   CL 105 05/09/2017   CO2 26 05/09/2017   GLUCOSE 93 05/09/2017   BUN 17 05/09/2017   CREATININE 1.11 05/09/2017   CALCIUM 9.0 05/09/2017   No results found for: INR No results found for: CHOL, HDL, LDLCALC, TRIG   GEN- The patient is well appearing, alert and oriented x 3 today.   Head- normocephalic, atraumatic Eyes-  Sclera clear, conjunctiva pink Ears- hearing intact Oropharynx- clear Neck- supple, no JVP Lymph- no cervical lymphadenopathy Lungs- Clear to ausculation bilaterally,  normal work of breathing Heart- rapid, regular rate and rhythm, no murmurs, rubs or gallops, PMI not laterally displaced GI- soft, NT, ND, + BS Extremities- no clubbing, cyanosis, or edema MS- no significant deformity or atrophy Skin- no rash or lesion Psych- euthymic mood, full affect Neuro- strength and sensation are intact  EKG- afib at 137 bpm, qrs int 110 ms, qtc 510 ms  Pr int 172 ms qrs 124 ms qtc 413 ms Epic records reviewed    Assessment and Plan:   1. Persistent afib For most part has been  staying in SR, breakthrough on 04/04/20 and persists with RVR Continue flecainide 100 mg bid Cannot add rate control as he has soft BP Continue apixaban 5 mg bid, reminded not to miss doses, chadsvasc score of 4 Cbc/bmet/covid   2. Lifestyle issues  Encouraged use of cpap as can undermine SR, pt has not used for some time    F/u  here in one week     Butch Penny C. Velena Keegan, [facility] [address] [place], [place] [address] [phone]

## 2020-04-08 NOTE — Patient Instructions (Signed)
Cardioversion scheduled for Friday, April 19th  - Arrive at the [facility] and go to admitting at [facility] not eat or drink anything after midnight the night prior to your procedure.  - Take all your morning medication with a sip of water prior to arrival.  - You will not be able to drive home after your procedure.

## 2020-04-08 NOTE — Progress Notes (Addendum)
Primary Care Physician: Marcell Anger, NP Referring Physician: Dr. [name] is a 64 y.o. male with a h/o paroxysmal afib that was recently see by Dr. [name] 04/14/17 and found to be in persistent afib . He has a chadsvasc score of at least 3 and was taking eliquis 5 mg qd instead of bid. He was asked to  Increase flecainide to 100 mg bid and to take eliquis correctly bid. In the afib clinic today, he remains in afib despite increase in flecainide. He has been on correct does of flecainide for at least the last 3 weeks. He has had previous cardioversion's and is wanting to proceed with cardioversion as per Dr. [name] plan. He is not consistent in use of cpap for sleep apnea.  F/u successful cardioversion 05/11/17 and is staying in SR. States compliance with flecainide and eliquis. Sees some improvement in energy.  F/u afib clinic 03/27/18. He continues to take flecainide and eliquis bid. He is having some breakthrough afib but is not bothered enough that he wants to move on to ablation or change in medication. 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 He stopped wearing cpap six months ago and is drinking 3 mugs of coffee in the am.   Asked to be seen in afib clinic, 04/08/20, for going into afib Friday. He has RVR with soft BP. He does not know of any  trigger. He does have untreated sleep apnea. Denies any alcohol or caffeine use. No missed apixaban for at least 3 weeks, with a CHA2DS2VASc score of 4.  Today, he denies symptoms of palpitations, chest pain, shortness of breath, orthopnea, PND, lower extremity edema, dizziness, presyncope, syncope, or neurologic sequela. The patient is tolerating medications without difficulties and is otherwise without complaint today.   Past Medical History:  Diagnosis Date  . A-fib (HCC)   . CVA (cerebral vascular accident) (HCC)   . GERD (gastroesophageal reflux disease)   . HLD  (hyperlipidemia)   . HTN (hypertension)   . Obesity   . OSA (obstructive sleep apnea)   . OSA (obstructive sleep apnea) 05/07/2015   Past Surgical History:  Procedure Laterality Date  . CARDIOVERSION N/A 06/03/2015   Procedure: CARDIOVERSION;  Surgeon: Wendall Stade, MD;  Location: [facility] ENDOSCOPY;  Service: Cardiovascular;  Laterality: N/A;  . CARDIOVERSION N/A 05/11/2017   Procedure: CARDIOVERSION;  Surgeon: Wendall Stade, MD;  Location: [facility] ENDOSCOPY;  Service: Cardiovascular;  Laterality: N/A;  . TONSILLECTOMY      Current Outpatient Medications  Medication Sig Dispense Refill  . apixaban (ELIQUIS) 5 MG TABS tablet Take 5 mg by mouth 2 (two) times daily.     . enalapril (VASOTEC) 20 MG tablet Take by mouth daily.     Marland Kitchen esomeprazole (NEXIUM) 20 MG packet Take 20 mg by mouth daily before breakfast.    . flecainide (TAMBOCOR) 100 MG tablet Take 1 tablet (100 mg total) by mouth 2 (two) times daily. 180 tablet 3  . tamsulosin (FLOMAX) 0.4 MG CAPS capsule Take 0.4 mg by mouth.    . Vitamin D, Ergocalciferol, (DRISDOL) 1.25 MG (50000 UNIT) CAPS capsule Take 50,000 Units by mouth every 30 (thirty) days.     No current facility-administered medications for this encounter.    Allergies  Allergen Reactions  . Ciprofloxacin Swelling    Pt states he took med for 3 days and ankle swelling started. Pt states he took  med for 3 days and ankle swelling started.     Social History   Socioeconomic History  . Marital status: Married    Spouse name: Not on file  . Number of children: Not on file  . Years of education: Not on file  . Highest education level: Not on file  Occupational History  . Not on file  Tobacco Use  . Smoking status: Never Smoker  . Smokeless tobacco: Never Used  Substance and Sexual Activity  . Alcohol use: No    Alcohol/week: 0.0 standard drinks  . Drug use: No  . Sexual activity: Not on file  Other Topics Concern  . Not on file  Social History Narrative  . Not  on file   Social Determinants of Health   Financial Resource Strain:   . Difficulty of Paying Living Expenses:   Food Insecurity:   . Worried About Programme researcher, broadcasting/film/video in the Last Year:   . Barista in the Last Year:   Transportation Needs:   . Freight forwarder (Medical):   Marland Kitchen Lack of Transportation (Non-Medical):   Physical Activity:   . Days of Exercise per Week:   . Minutes of Exercise per Session:   Stress:   . Feeling of Stress :   Social Connections:   . Frequency of Communication with Friends and Family:   . Frequency of Social Gatherings with Friends and Family:   . Attends Religious Services:   . Active Member of Clubs or Organizations:   . Attends Banker Meetings:   Marland Kitchen Marital Status:   Intimate Partner Violence:   . Fear of Current or Ex-Partner:   . Emotionally Abused:   Marland Kitchen Physically Abused:   . Sexually Abused:     Family History  Problem Relation Age of Onset  . Atrial fibrillation Mother   . Cancer Father        BONE  . Heart disease Father   . Prostate cancer Other     ROS- All systems are reviewed and negative except as per the HPI above  Physical Exam: Vitals:   04/08/20 0840  BP: 107/76  Pulse: (!) 137  Weight: 90.4 kg  Height: 5\' 8"  (1.727 m)   Wt Readings from Last 3 Encounters:  04/08/20 90.4 kg  01/11/19 96.6 kg  01/04/19 96.6 kg    Labs: Lab Results  Component Value Date   NA 139 05/09/2017   K 4.1 05/09/2017   CL 105 05/09/2017   CO2 26 05/09/2017   GLUCOSE 93 05/09/2017   BUN 17 05/09/2017   CREATININE 1.11 05/09/2017   CALCIUM 9.0 05/09/2017   No results found for: INR No results found for: CHOL, HDL, LDLCALC, TRIG   GEN- The patient is well appearing, alert and oriented x 3 today.   Head- normocephalic, atraumatic Eyes-  Sclera clear, conjunctiva pink Ears- hearing intact Oropharynx- clear Neck- supple, no JVP Lymph- no cervical lymphadenopathy Lungs- Clear to ausculation bilaterally,  normal work of breathing Heart- rapid, regular rate and rhythm, no murmurs, rubs or gallops, PMI not laterally displaced GI- soft, NT, ND, + BS Extremities- no clubbing, cyanosis, or edema MS- no significant deformity or atrophy Skin- no rash or lesion Psych- euthymic mood, full affect Neuro- strength and sensation are intact  EKG- afib at 137 bpm, qrs int 110 ms, qtc 510 ms  Pr int 172 ms qrs 124 ms qtc 413 ms Epic records reviewed    Assessment and Plan:  1. Persistent afib For most part has been  staying in SR, breakthrough on 04/04/20 and persists with RVR Continue flecainide 100 mg bid Cannot add rate control as he has soft BP Continue apixaban 5 mg bid, reminded not to miss doses, chadsvasc score of 4 Cbc/bmet/covid   2. Lifestyle issues  Encouraged use of cpap as can undermine SR, pt has not used for some time    F/u  here in one week     Butch Penny C. Kameren Baade, [facility] [address] [place], [place] [address] [phone]

## 2020-04-11 ENCOUNTER — Ambulatory Visit (HOSPITAL_COMMUNITY): Payer: Medicare Other | Admitting: Anesthesiology

## 2020-04-11 ENCOUNTER — Encounter (HOSPITAL_COMMUNITY)
Admission: RE | Disposition: A | Discharge status: Home / Self Care | Payer: Self-pay | Source: Home / Self Care | Attending: Internal Medicine

## 2020-04-11 ENCOUNTER — Encounter (HOSPITAL_COMMUNITY): Payer: Self-pay | Admitting: Internal Medicine

## 2020-04-11 ENCOUNTER — Ambulatory Visit (HOSPITAL_COMMUNITY)
Admission: RE | Admit: 2020-04-11 | Discharge: 2020-04-11 | Disposition: A | Discharge status: Home / Self Care | Payer: Medicare Other | Attending: Internal Medicine | Admitting: Internal Medicine

## 2020-04-11 ENCOUNTER — Other Ambulatory Visit: Payer: Self-pay

## 2020-04-11 DIAGNOSIS — I4819 Other persistent atrial fibrillation: Secondary | ICD-10-CM | POA: Diagnosis not present

## 2020-04-11 DIAGNOSIS — Z8673 Personal history of transient ischemic attack (TIA), and cerebral infarction without residual deficits: Secondary | ICD-10-CM | POA: Diagnosis not present

## 2020-04-11 DIAGNOSIS — Z79899 Other long term (current) drug therapy: Secondary | ICD-10-CM | POA: Insufficient documentation

## 2020-04-11 DIAGNOSIS — G4733 Obstructive sleep apnea (adult) (pediatric): Secondary | ICD-10-CM | POA: Diagnosis not present

## 2020-04-11 DIAGNOSIS — E785 Hyperlipidemia, unspecified: Secondary | ICD-10-CM | POA: Diagnosis not present

## 2020-04-11 DIAGNOSIS — K219 Gastro-esophageal reflux disease without esophagitis: Secondary | ICD-10-CM | POA: Diagnosis not present

## 2020-04-11 DIAGNOSIS — Z7901 Long term (current) use of anticoagulants: Secondary | ICD-10-CM | POA: Insufficient documentation

## 2020-04-11 DIAGNOSIS — Z6831 Body mass index (BMI) 31.0-31.9, adult: Secondary | ICD-10-CM | POA: Insufficient documentation

## 2020-04-11 DIAGNOSIS — I4891 Unspecified atrial fibrillation: Secondary | ICD-10-CM | POA: Diagnosis not present

## 2020-04-11 DIAGNOSIS — E669 Obesity, unspecified: Secondary | ICD-10-CM | POA: Diagnosis not present

## 2020-04-11 DIAGNOSIS — I10 Essential (primary) hypertension: Secondary | ICD-10-CM | POA: Insufficient documentation

## 2020-04-11 HISTORY — PX: CARDIOVERSION: SHX1299

## 2020-04-11 HISTORY — DX: Other complications of anesthesia, initial encounter: T88.59XA

## 2020-04-11 LAB — COMPREHENSIVE METABOLIC PANEL
ALT: 11 U/L (ref 0–44)
AST: 18 U/L (ref 15–41)
Albumin: 3.8 g/dL (ref 3.5–5.0)
Alkaline Phosphatase: 70 U/L (ref 38–126)
Anion gap: 9 (ref 5–15)
BUN: 20 mg/dL (ref 8–23)
CO2: 26 mmol/L (ref 22–32)
Calcium: 9.1 mg/dL (ref 8.9–10.3)
Chloride: 108 mmol/L (ref 98–111)
Creatinine, Ser: 1.23 mg/dL (ref 0.61–1.24)
GFR calc Af Amer: >60 mL/min (ref >60)
GFR calc non Af Amer: >60 mL/min (ref >60)
Glucose, Bld: 103 mg/dL — ABNORMAL HIGH (ref 70–99)
Potassium: 4.3 mmol/L (ref 3.5–5.1)
Sodium: 143 mmol/L (ref 135–145)
Total Bilirubin: 1.2 mg/dL (ref 0.3–1.2)
Total Protein: 6.6 g/dL (ref 6.5–8.1)

## 2020-04-11 SURGERY — CARDIOVERSION
Anesthesia: General

## 2020-04-11 MED ORDER — SODIUM CHLORIDE 0.9 % IV SOLN
INTRAVENOUS | Status: AC | PRN
  Administered 2020-04-11: 500 mL via INTRAMUSCULAR

## 2020-04-11 MED ORDER — LIDOCAINE HCL (CARDIAC) PF 100 MG/5ML IV SOSY
PREFILLED_SYRINGE | INTRAVENOUS | Status: DC | PRN
  Administered 2020-04-11: 60 mg via INTRATRACHEAL

## 2020-04-11 MED ORDER — PROPOFOL 10 MG/ML IV BOLUS
INTRAVENOUS | Status: DC | PRN
  Administered 2020-04-11: 60 mg via INTRAVENOUS

## 2020-04-11 MED ORDER — SODIUM CHLORIDE 0.9 % IV SOLN: INTRAVENOUS | Status: DC | PRN

## 2020-04-11 NOTE — CV Procedure (Signed)
CARDIOVERSION   Patient anesthetized by anesthesia with Propofol and lidocaine intravenously  WIth pads in AP position, patient cardioverted to SR with 200 J syncrhonized biphasic energy.    Procedure wsa without complication  12 lead EKG pending   Dietrich Pates MD

## 2020-04-11 NOTE — Transfer of Care (Signed)
Immediate Anesthesia Transfer of Care Note  Patient: Tanner Rich  Procedure(s) Performed: CARDIOVERSION (N/A )  Patient Location: Endoscopy Unit  Anesthesia Type:MAC  Level of Consciousness: drowsy  Airway & Oxygen Therapy: Patient Spontanous Breathing  Post-op Assessment: Report given to RN and Post -op Vital signs reviewed and stable  Post vital signs: Reviewed and stable  Last Vitals:  Vitals Value Taken Time  BP 128/86   Temp    Pulse 79   Resp 16   SpO2 97     Last Pain:  Vitals:   04/11/20 0807  TempSrc: Oral  PainSc: 0-No pain         Complications: No apparent anesthesia complications

## 2020-04-11 NOTE — Anesthesia Postprocedure Evaluation (Signed)
Anesthesia Post Note  Patient: Tanner Rich  Procedure(s) Performed: CARDIOVERSION (N/A )     Patient location during evaluation: Endoscopy Anesthesia Type: General Level of consciousness: awake and alert Pain management: pain level controlled Vital Signs Assessment: post-procedure vital signs reviewed and stable Respiratory status: spontaneous breathing, nonlabored ventilation, respiratory function stable and patient connected to nasal cannula oxygen Cardiovascular status: blood pressure returned to baseline and stable Postop Assessment: no apparent nausea or vomiting Anesthetic complications: no    Last Vitals:  Vitals:   04/11/20 0915 04/11/20 0925  BP: (!) 130/105 (!) 131/98  Pulse: 79 81  Resp: 18 13  Temp:    SpO2: 94% 96%    Last Pain:  Vitals:   04/11/20 0925  TempSrc:   PainSc: 0-No pain                 Cecile Hearing

## 2020-04-11 NOTE — Interval H&P Note (Signed)
History and Physical Interval Note:  04/11/2020 8:10 AM  Tanner Rich  has presented today for surgery, with the diagnosis of AFIB.  The various methods of treatment have been discussed with the patient and family. After consideration of risks, benefits and other options for treatment, the patient has consented to  Procedure(s): CARDIOVERSION (N/A) as a surgical intervention.  The patient's history has been reviewed, patient examined, no change in status, stable for surgery.  I have reviewed the patient's chart and labs.  Questions were answered to the patient's satisfaction.     Dietrich Pates

## 2020-04-11 NOTE — Anesthesia Preprocedure Evaluation (Signed)
Anesthesia Evaluation  Patient identified by MRN, date of birth, ID band Patient awake    Reviewed: Allergy & Precautions, NPO status , Patient's Chart, lab work & pertinent test results  Airway Mallampati: II  TM Distance: >3 FB Neck ROM: Full    Dental  (+) Teeth Intact, Dental Advisory Given   Pulmonary sleep apnea ,    Pulmonary exam normal breath sounds clear to auscultation       Cardiovascular hypertension, Pt. on medications + dysrhythmias Atrial Fibrillation  Rhythm:Irregular Rate:Abnormal     Neuro/Psych CVA, No Residual Symptoms    GI/Hepatic Neg liver ROS, GERD  Medicated and Controlled,  Endo/Other  Obesity   Renal/GU negative Renal ROS     Musculoskeletal negative musculoskeletal ROS (+)   Abdominal   Peds  Hematology  (+) Blood dyscrasia (Eliquis), ,   Anesthesia Other Findings Day of surgery medications reviewed with the patient.  Reproductive/Obstetrics                             Anesthesia Physical Anesthesia Plan  ASA: III  Anesthesia Plan: General   Post-op Pain Management:    Induction: Intravenous  PONV Risk Score and Plan: 2 and Propofol infusion and Treatment may vary due to age or medical condition  Airway Management Planned: Mask  Additional Equipment:   Intra-op Plan:   Post-operative Plan:   Informed Consent: I have reviewed the patients History and Physical, chart, labs and discussed the procedure including the risks, benefits and alternatives for the proposed anesthesia with the patient or authorized representative who has indicated his/her understanding and acceptance.     Dental advisory given  Plan Discussed with: CRNA  Anesthesia Plan Comments:         Anesthesia Quick Evaluation

## 2020-04-11 NOTE — Discharge Instructions (Signed)
Electrical Cardioversion Electrical cardioversion is the delivery of a jolt of electricity to restore a normal rhythm to the heart. A rhythm that is too fast or is not regular keeps the heart from pumping well. In this procedure, sticky patches or metal paddles are placed on the chest to deliver electricity to the heart from a device. This procedure may be done in an emergency if:  There is low or no blood pressure as a result of the heart rhythm.  Normal rhythm must be restored as fast as possible to protect the brain and heart from further damage.  It may save a life. This may also be a scheduled procedure for irregular or fast heart rhythms that are not immediately life-threatening. Tell a health care provider about:  Any allergies you have.  All medicines you are taking, including vitamins, herbs, eye drops, creams, and over-the-counter medicines.  Any problems you or family members have had with anesthetic medicines.  Any blood disorders you have.  Any surgeries you have had.  Any medical conditions you have.  Whether you are pregnant or may be pregnant. What are the risks? Generally, this is a safe procedure. However, problems may occur, including:  Allergic reactions to medicines.  A blood clot that breaks free and travels to other parts of your body.  The possible return of an abnormal heart rhythm within hours or days after the procedure.  Your heart stopping (cardiac arrest). This is rare. What happens before the procedure? Medicines  Your health care provider may have you start taking: ? Blood-thinning medicines (anticoagulants) so your blood does not clot as easily. ? Medicines to help stabilize your heart rate and rhythm.  Ask your health care provider about: ? Changing or stopping your regular medicines. This is especially important if you are taking diabetes medicines or blood thinners. ? Taking medicines such as aspirin and ibuprofen. These medicines can  thin your blood. Do not take these medicines unless your health care provider tells you to take them. ? Taking over-the-counter medicines, vitamins, herbs, and supplements. General instructions  Follow instructions from your health care provider about eating or drinking restrictions.  Plan to have someone take you home from the hospital or clinic.  If you will be going home right after the procedure, plan to have someone with you for 24 hours.  Ask your health care provider what steps will be taken to help prevent infection. These may include washing your skin with a germ-killing soap. What happens during the procedure?   An IV will be inserted into one of your veins.  Sticky patches (electrodes) or metal paddles may be placed on your chest.  You will be given a medicine to help you relax (sedative).  An electrical shock will be delivered. The procedure may vary among health care providers and hospitals. What can I expect after the procedure?  Your blood pressure, heart rate, breathing rate, and blood oxygen level will be monitored until you leave the hospital or clinic.  Your heart rhythm will be watched to make sure it does not change.  You may have some redness on the skin where the shocks were given. Follow these instructions at home:  Do not drive for 24 hours if you were given a sedative during your procedure.  Take over-the-counter and prescription medicines only as told by your health care provider.  Ask your health care provider how to check your pulse. Check it often.  Rest for 48 hours after the procedure or   as told by your health care provider.  Avoid or limit your caffeine use as told by your health care provider.  Keep all follow-up visits as told by your health care provider. This is important. Contact a health care provider if:  You feel like your heart is beating too quickly or your pulse is not regular.  You have a serious muscle cramp that does not go  away. Get help right away if:  You have discomfort in your chest.  You are dizzy or you feel faint.  You have trouble breathing or you are short of breath.  Your speech is slurred.  You have trouble moving an arm or leg on one side of your body.  Your fingers or toes turn cold or blue. Summary  Electrical cardioversion is the delivery of a jolt of electricity to restore a normal rhythm to the heart.  This procedure may be done right away in an emergency or may be a scheduled procedure if the condition is not an emergency.  Generally, this is a safe procedure.  After the procedure, check your pulse often as told by your health care provider. This information is not intended to replace advice given to you by your health care provider. Make sure you discuss any questions you have with your health care provider. Document Revised: 07/16/2019 Document Reviewed: 07/16/2019 Elsevier Patient Education  2020 Elsevier Inc.  

## 2020-04-14 ENCOUNTER — Encounter: Payer: Self-pay | Admitting: *Deleted

## 2020-04-18 ENCOUNTER — Other Ambulatory Visit: Payer: Self-pay

## 2020-04-18 ENCOUNTER — Encounter (HOSPITAL_COMMUNITY): Payer: Self-pay | Admitting: Nurse Practitioner

## 2020-04-18 ENCOUNTER — Ambulatory Visit (HOSPITAL_COMMUNITY)
Admission: RE | Admit: 2020-04-18 | Discharge: 2020-04-18 | Disposition: A | Discharge status: Home / Self Care | Payer: Medicare Other | Source: Ambulatory Visit | Attending: Nurse Practitioner | Admitting: Nurse Practitioner

## 2020-04-18 VITALS — BP 130/80 | HR 62 | Ht 68.0 in | Wt 206.2 lb

## 2020-04-18 DIAGNOSIS — E785 Hyperlipidemia, unspecified: Secondary | ICD-10-CM | POA: Diagnosis not present

## 2020-04-18 DIAGNOSIS — Z79899 Other long term (current) drug therapy: Secondary | ICD-10-CM | POA: Insufficient documentation

## 2020-04-18 DIAGNOSIS — Z881 Allergy status to other antibiotic agents status: Secondary | ICD-10-CM | POA: Diagnosis not present

## 2020-04-18 DIAGNOSIS — I4819 Other persistent atrial fibrillation: Secondary | ICD-10-CM | POA: Insufficient documentation

## 2020-04-18 DIAGNOSIS — I10 Essential (primary) hypertension: Secondary | ICD-10-CM | POA: Insufficient documentation

## 2020-04-18 DIAGNOSIS — K219 Gastro-esophageal reflux disease without esophagitis: Secondary | ICD-10-CM | POA: Diagnosis not present

## 2020-04-18 DIAGNOSIS — Z8673 Personal history of transient ischemic attack (TIA), and cerebral infarction without residual deficits: Secondary | ICD-10-CM | POA: Diagnosis not present

## 2020-04-18 DIAGNOSIS — D6869 Other thrombophilia: Secondary | ICD-10-CM

## 2020-04-18 DIAGNOSIS — I480 Paroxysmal atrial fibrillation: Secondary | ICD-10-CM | POA: Diagnosis not present

## 2020-04-18 DIAGNOSIS — Z7901 Long term (current) use of anticoagulants: Secondary | ICD-10-CM | POA: Insufficient documentation

## 2020-04-18 DIAGNOSIS — G4733 Obstructive sleep apnea (adult) (pediatric): Secondary | ICD-10-CM | POA: Insufficient documentation

## 2020-04-18 NOTE — Progress Notes (Signed)
Primary Care Physician: Tanner Anger, NP Referring Physician: Dr. Fletcher Rich is a 64 y.o. male with a h/o paroxysmal afib that was recently see by Dr. Jasper Rich 04/14/17 and found to be in persistent afib . He has a chadsvasc score of at least 3 and was taking eliquis 5 mg qd instead of bid. He was asked to  Increase flecainide to 100 mg bid and to take eliquis correctly bid. In the afib clinic today, he remains in afib despite increase in flecainide. He has been on correct does of flecainide for at least the last 3 weeks. He has had previous cardioversion's and is wanting to proceed with cardioversion as per Dr. Fabio Rich plan. He is not consistent in use of cpap for sleep apnea.  F/u successful cardioversion 05/11/17 and is staying in SR. States compliance with flecainide and eliquis. Sees some improvement in energy.  F/u afib clinic 03/27/18. He continues to take flecainide and eliquis bid. He is having some breakthrough afib but is not bothered enough that he wants to move on to ablation or change in medication. 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 He stopped wearing cpap six months ago and is drinking 3 mugs of coffee in the am.   Asked to be seen in afib clinic, 04/08/20, for going into afib Friday. He has RVR with soft BP. He does not know of any  trigger. He does have untreated sleep apnea. Denies any alcohol or caffeine use. No missed apixaban for at least 3 weeks, with a CHA2DS2VASc score of 4.  F/u in afib clinic, 04/18/20. He did have successful cardioversion. 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 Long discussion with him not using cpap. He will try to get back on it on a regular basis.   Today, he denies symptoms of palpitations, chest pain, shortness  of breath, orthopnea, PND, lower extremity edema, dizziness, presyncope, syncope, or neurologic sequela. The patient is tolerating medications without difficulties and is otherwise without complaint today.   Past Medical History:  Diagnosis Date  . A-fib (HCC)   . Complication of anesthesia    "waking up, throat closed up"  . CVA (cerebral vascular accident) (HCC)   . GERD (gastroesophageal reflux disease)   . HLD (hyperlipidemia)   . HTN (hypertension)   . Obesity   . OSA (obstructive sleep apnea)   . OSA (obstructive sleep apnea) 05/07/2015   Past Surgical History:  Procedure Laterality Date  . CARDIOVERSION N/A 06/03/2015   Procedure: CARDIOVERSION;  Surgeon: Tanner Stade, MD;  Location: [facility] ENDOSCOPY;  Service: Cardiovascular;  Laterality: N/A;  . CARDIOVERSION N/A 05/11/2017   Procedure: CARDIOVERSION;  Surgeon: Tanner Stade, MD;  Location: [facility] ENDOSCOPY;  Service: Cardiovascular;  Laterality: N/A;  . CARDIOVERSION N/A 04/11/2020   Procedure: CARDIOVERSION;  Surgeon: Tanner Riffle, MD;  Location: [facility] ENDOSCOPY;  Service: Cardiovascular;  Laterality: N/A;  . TONSILLECTOMY      Current Outpatient Medications  Medication Sig Dispense Refill  . apixaban (ELIQUIS) 5 MG TABS tablet Take 5 mg by mouth 2 (two) times daily.     . enalapril (VASOTEC) 20 MG tablet Take 20 mg by mouth daily.     Marland Kitchen esomeprazole (NEXIUM) 20 MG packet Take 20 mg by  mouth daily before breakfast.    . flecainide (TAMBOCOR) 100 MG tablet Take 1 tablet (100 mg total) by mouth 2 (two) times daily. 180 tablet 3  . tamsulosin (FLOMAX) 0.4 MG CAPS capsule Take 0.4 mg by mouth daily.     . Vitamin D, Ergocalciferol, (DRISDOL) 1.25 MG (50000 UNIT) CAPS capsule Take 50,000 Units by mouth every 30 (thirty) days.     No current facility-administered medications for this encounter.    Allergies  Allergen Reactions  . Ciprofloxacin Swelling    Pt states he took med for 3 days and ankle swelling started. Pt states he  took med for 3 days and ankle swelling started.     Social History   Socioeconomic History  . Marital status: Married    Spouse name: Not on file  . Number of children: Not on file  . Years of education: Not on file  . Highest education level: Not on file  Occupational History  . Not on file  Tobacco Use  . Smoking status: Never Smoker  . Smokeless tobacco: Never Used  Substance and Sexual Activity  . Alcohol use: No    Alcohol/week: 0.0 standard drinks  . Drug use: No  . Sexual activity: Not on file  Other Topics Concern  . Not on file  Social History Narrative  . Not on file   Social Determinants of Health   Financial Resource Strain:   . Difficulty of Paying Living Expenses:   Food Insecurity:   . Worried About Programme researcher, broadcasting/film/video in the Last Year:   . Barista in the Last Year:   Transportation Needs:   . Freight forwarder (Medical):   Marland Kitchen Lack of Transportation (Non-Medical):   Physical Activity:   . Days of Exercise per Week:   . Minutes of Exercise per Session:   Stress:   . Feeling of Stress :   Social Connections:   . Frequency of Communication with Friends and Family:   . Frequency of Social Gatherings with Friends and Family:   . Attends Religious Services:   . Active Member of Clubs or Organizations:   . Attends Banker Meetings:   Marland Kitchen Marital Status:   Intimate Partner Violence:   . Fear of Current or Ex-Partner:   . Emotionally Abused:   Marland Kitchen Physically Abused:   . Sexually Abused:     Family History  Problem Relation Age of Onset  . Atrial fibrillation Mother   . Cancer Father        BONE  . Heart disease Father   . Prostate cancer Other     ROS- All systems are reviewed and negative except as per the HPI above  Physical Exam: Vitals:   04/18/20 0850  BP: 130/80  Pulse: 62  Weight: 93.5 kg  Height: 5\' 8"  (1.727 m)   Wt Readings from Last 3 Encounters:  04/18/20 93.5 kg  04/11/20 93 kg  04/08/20 90.4 kg     Labs: Lab Results  Component Value Date   NA 143 04/11/2020   K 4.3 04/11/2020   CL 108 04/11/2020   CO2 26 04/11/2020   GLUCOSE 103 (H) 04/11/2020   BUN 20 04/11/2020   CREATININE 1.23 04/11/2020   CALCIUM 9.1 04/11/2020   No results found for: INR No results found for: CHOL, HDL, LDLCALC, TRIG   GEN- The patient is well appearing, alert and oriented x 3 today.   Head- normocephalic, atraumatic Eyes-  Sclera  clear, conjunctiva pink Ears- hearing intact Oropharynx- clear Neck- supple, no JVP Lymph- no cervical lymphadenopathy Lungs- Clear to ausculation bilaterally, normal work of breathing Heart-   regular rate and rhythm, no murmurs, rubs or gallops, PMI not laterally displaced GI- soft, NT, ND, + BS Extremities- no clubbing, cyanosis, or edema MS- no significant deformity or atrophy Skin- no rash or lesion Psych- euthymic mood, full affect Neuro- strength and sensation are intact  EKG- SR with RBBB/LAFB at 62 bpm, PR int 182 ms, qrs int 122 ms, qtc 430  Epic records reviewed    Assessment and Plan: 1. Persistent afib For most part has been  staying in SR, breakthrough on 04/04/20 and persists with RVR Successful cardioversion 04/11/20 Continue flecainide 100 mg bid for now but will discuss with EP if candidate for ablation vrs change needed in antiarrythmic  Cannot add rate control as he has HR low 60's Continue apixaban 5 mg bid, reminded not to miss doses, chadsvasc score of 4 Echo will need to be updated if ablation is pursued   2. Lifestyle issues  Encouraged use of cpap as can undermine SR, pt has not used for some time  C/o of fatigue  Weight loss encourgaed   I will in be  touch with pt after talking to EP re above options     Butch Penny C. Kiah Keay, [facility] [address] [place], [place] [address] [phone]

## 2020-12-17 DIAGNOSIS — U071 COVID-19: Secondary | ICD-10-CM | POA: Insufficient documentation

## 2021-01-13 ENCOUNTER — Ambulatory Visit (HOSPITAL_COMMUNITY)
Admission: RE | Admit: 2021-01-13 | Payer: Medicare Other | Source: Ambulatory Visit | Attending: Cardiovascular Disease | Admitting: Cardiovascular Disease

## 2021-01-26 ENCOUNTER — Other Ambulatory Visit: Payer: Self-pay

## 2021-01-26 ENCOUNTER — Ambulatory Visit (HOSPITAL_COMMUNITY)
Admission: RE | Admit: 2021-01-26 | Discharge: 2021-01-26 | Disposition: A | Discharge status: Home / Self Care | Payer: Medicare Other | Source: Ambulatory Visit | Attending: Internal Medicine | Admitting: Internal Medicine

## 2021-01-26 DIAGNOSIS — I6521 Occlusion and stenosis of right carotid artery: Secondary | ICD-10-CM

## 2021-01-26 NOTE — Progress Notes (Signed)
Primary Care Physician: Marcell Anger, NP Referring Physician: Dr. [name] is a 65 y.o. male with a h/o paroxysmal afib . He has a chadsvasc score of at least 4 He has OSA and has not been compliant with his CPAP   04/23/15 DCC early reversion started on flecainide converted   4/13/21Recurrence successful [employer] on flecainide 04/11/20   Baseline ECG slow HR;s 60's with RBBB/LAFB Not on beta blocker due to this but can go fast when he has PAF. Sebastian Ache last saw 04/18/21 and was going to discuss with EP ? 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 A-fib (HCC)   . Complication of anesthesia    "waking up, throat closed up"  . CVA (cerebral vascular accident) (HCC)   . GERD (gastroesophageal reflux disease)   . HLD (hyperlipidemia)   . HTN (hypertension)   . Obesity   . OSA (obstructive sleep apnea)   . OSA (obstructive sleep apnea) 05/07/2015   Past Surgical History:  Procedure Laterality Date  . CARDIOVERSION N/A 06/03/2015   Procedure: CARDIOVERSION;  Surgeon: Wendall Stade, MD;  Location: [facility] ENDOSCOPY;  Service: Cardiovascular;  Laterality: N/A;  . CARDIOVERSION N/A 05/11/2017   Procedure: CARDIOVERSION;  Surgeon: Wendall Stade, MD;  Location: [facility] ENDOSCOPY;  Service: Cardiovascular;  Laterality: N/A;  . CARDIOVERSION N/A 04/11/2020   Procedure: CARDIOVERSION;  Surgeon: Pricilla Riffle, MD;  Location: [facility] ENDOSCOPY;  Service: Cardiovascular;  Laterality: N/A;  . TONSILLECTOMY      Current Outpatient Medications  Medication Sig  Dispense Refill  . enalapril (VASOTEC) 20 MG tablet Take 20 mg by mouth daily.     Marland Kitchen esomeprazole (NEXIUM) 20 MG packet Take 20 mg by mouth daily before breakfast.    . flecainide (TAMBOCOR) 100 MG tablet Take 1 tablet (100 mg total) by mouth 2 (two) times daily. 180 tablet 3  . rivaroxaban (XARELTO) 20 MG TABS tablet Take 20 mg by mouth daily.    . tamsulosin (FLOMAX) 0.4 MG CAPS capsule Take 0.4 mg by mouth daily.     . Vitamin D, Ergocalciferol, (DRISDOL) 1.25 MG (50000 UNIT) CAPS capsule Take 50,000 Units by mouth every 30 (thirty) days.     No current facility-administered medications for this visit.    Allergies  Allergen Reactions  . Ciprofloxacin Swelling    Pt states he took med for 3 days and ankle swelling started. Pt states he took med for 3 days and ankle swelling started.     Social History   Socioeconomic History  . Marital status: Married    Spouse name: Not on file  . Number of children: Not on file  . Years of education: Not on file  . Highest education level: Not on file  Occupational History  . Not on file  Tobacco Use  . Smoking status: Never Smoker  . Smokeless tobacco: Never Used  Substance and Sexual Activity  . Alcohol use: No    Alcohol/week: 0.0 standard drinks  . Drug  use: No  . Sexual activity: Not on file  Other Topics Concern  . Not on file  Social History Narrative  . Not on file   Social Determinants of Health   Financial Resource Strain: Not on file  Food Insecurity: Not on file  Transportation Needs: Not on file  Physical Activity: Not on file  Stress: Not on file  Social Connections: Not on file  Intimate Partner Violence: Not on file    Family History  Problem Relation Age of Onset  . Atrial fibrillation Mother   . Cancer Father        BONE  . Heart disease Father   . Prostate cancer Other     ROS- All systems are reviewed and negative except as per the HPI above  Physical Exam: Vitals:   01/27/21 1112  BP: 120/82   Pulse: 68  SpO2: 98%  Weight: 88 kg  Height: 5\' 8"  (1.727 m)   Wt Readings from Last 3 Encounters:  01/27/21 88 kg  04/18/20 93.5 kg  04/11/20 93 kg   Affect appropriate Healthy:  appears stated age HEENT: normal Neck supple with no adenopathy JVP normal no bruits no thyromegaly Lungs clear with no wheezing and good diaphragmatic motion Heart:  S1/S2 no murmur, no rub, gallop or click PMI normal Abdomen: benighn, BS positve, no tenderness, no AAA no bruit.  No HSM or HJR Distal pulses intact with no bruits No edema Neuro non-focal Skin warm and dry No muscular weakness  Labs: Lab Results  Component Value Date   NA 143 04/11/2020   K 4.3 04/11/2020   CL 108 04/11/2020   CO2 26 04/11/2020   GLUCOSE 103 (H) 04/11/2020   BUN 20 04/11/2020   CREATININE 1.23 04/11/2020   CALCIUM 9.1 04/11/2020     EKG- SR with RBBB/LAFB at 62 bpm, PR int 182 ms, qrs int 122 ms, qtc 430    Assessment and Plan: 1. Persistent afib Post DCC x 2 now on flecainide Most recent [facility] 04/11/20 On chronic eliquis with no bleeding issues. 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 Lifestyle issues  - CPAP compliance refer ENT for Inspire device  - weight loss   3. Carotid - known right occlusion no left sided dx f/u duplex January 2023   Extensive time spent discussing link between OSA and PAF and referral to both EP and ENT   February 2023 MD [facility]

## 2021-01-27 ENCOUNTER — Encounter: Payer: Self-pay | Admitting: Cardiovascular Disease

## 2021-01-27 ENCOUNTER — Ambulatory Visit (INDEPENDENT_AMBULATORY_CARE_PROVIDER_SITE_OTHER): Payer: Medicare Other | Admitting: Cardiovascular Disease

## 2021-01-27 VITALS — BP 120/82 | HR 68 | Ht 68.0 in | Wt 194.0 lb

## 2021-01-27 DIAGNOSIS — G4733 Obstructive sleep apnea (adult) (pediatric): Secondary | ICD-10-CM | POA: Diagnosis not present

## 2021-01-27 DIAGNOSIS — I480 Paroxysmal atrial fibrillation: Secondary | ICD-10-CM | POA: Diagnosis not present

## 2021-01-27 NOTE — Patient Instructions (Addendum)
Medication Instructions:  *If you need a refill on your cardiac medications before your next appointment, please call your pharmacy*  Lab Work: If you have labs (blood work) drawn today and your tests are completely normal, you will receive your results only by: Marland Kitchen MyChart Message (if you have MyChart) OR . A paper copy in the mail If you have any lab test that is abnormal or we need to change your treatment, we will call you to review the results.  Follow-Up: At [facility], you and your health needs are our priority.  As part of our continuing mission to provide you with exceptional heart care, we have created designated Provider Care Teams.  These Care Teams include your primary Cardiologist (physician) and Advanced Practice Providers (APPs -  Physician Assistants and Nurse Practitioners) who all work together to provide you with the care you need, when you need it.  We recommend signing up for the patient portal called "MyChart".  Sign up information is provided on this After Visit Summary.  MyChart is used to connect with patients for Virtual Visits (Telemedicine).  Patients are able to view lab/test results, encounter notes, upcoming appointments, etc.  Non-urgent messages can be sent to your provider as well.   To learn more about what you can do with MyChart, go to [email or web address].    Your next appointment:   6 month(s)  The format for your next appointment:   In Person  Provider:   You may see Charlton Haws, MD or one of the following Advanced Practice Providers on your designated Care Team:    Georgie Chard, NP    Other Instructions You have been referred to Dr. Lalla Brothers to discuss Ablation.

## 2021-02-10 ENCOUNTER — Other Ambulatory Visit: Payer: Self-pay

## 2021-02-10 ENCOUNTER — Encounter: Payer: Self-pay | Admitting: Cardiology

## 2021-02-10 ENCOUNTER — Ambulatory Visit (INDEPENDENT_AMBULATORY_CARE_PROVIDER_SITE_OTHER): Payer: Medicare Other | Admitting: Cardiology

## 2021-02-10 VITALS — BP 142/84 | HR 65 | Ht 67.0 in | Wt 196.0 lb

## 2021-02-10 DIAGNOSIS — G4733 Obstructive sleep apnea (adult) (pediatric): Secondary | ICD-10-CM | POA: Diagnosis not present

## 2021-02-10 DIAGNOSIS — I480 Paroxysmal atrial fibrillation: Secondary | ICD-10-CM

## 2021-02-10 NOTE — Progress Notes (Signed)
Electrophysiology Office Note:    Date:  02/10/2021   ID:  Tanner Rich, DOB [birth year], MRN [id number]  PCP:  Marcell Anger, NP  [facility] Cardiologist:  Charlton Haws, MD  [facility] Electrophysiologist:  Lanier Prude, MD   Referring MD: Wendall Stade, MD   Chief Complaint: Atrial fibrillation  History of Present Illness:    Tanner Rich is a 65 y.o. male who presents for an evaluation of atrial fibrillation at the request of Dr. Eden Emms. Their medical history includes hypertension, hyperlipidemia, GERD, CVA, obstructive sleep apnea.  The patient was last seen by Dr. Eden Emms on January 27, 2021.  His atrial fibrillation dates back several years.  He has previously been managed with cardioversion and flecainide.  Given the development of right bundle branch block and left anterior fascicular block, class Ic agents were thought to not be a good option.  Today he tells me that he is very symptomatic when he is in atrial fibrillation.  He feels very fatigued and has no energy.  No syncope or presyncope.  He continues to take flecainide.  He is interested in pursuing ablation in an effort to avoid long-term medication use.  He currently does not use his CPAP machine because he is not able to tolerate the mask.  He has been referred to ENT for the inspire device.  Past Medical History:  Diagnosis Date  . A-fib (HCC)   . Complication of anesthesia    "waking up, throat closed up"  . CVA (cerebral vascular accident) (HCC)   . GERD (gastroesophageal reflux disease)   . HLD (hyperlipidemia)   . HTN (hypertension)   . Obesity   . OSA (obstructive sleep apnea)   . OSA (obstructive sleep apnea) 05/07/2015    Past Surgical History:  Procedure Laterality Date  . CARDIOVERSION N/A 06/03/2015   Procedure: CARDIOVERSION;  Surgeon: Wendall Stade, MD;  Location: [facility] ENDOSCOPY;  Service: Cardiovascular;  Laterality: N/A;  . CARDIOVERSION N/A 05/11/2017   Procedure: CARDIOVERSION;   Surgeon: Wendall Stade, MD;  Location: [facility] ENDOSCOPY;  Service: Cardiovascular;  Laterality: N/A;  . CARDIOVERSION N/A 04/11/2020   Procedure: CARDIOVERSION;  Surgeon: Pricilla Riffle, MD;  Location: [facility] ENDOSCOPY;  Service: Cardiovascular;  Laterality: N/A;  . TONSILLECTOMY      Current Medications: Current Meds  Medication Sig  . enalapril (VASOTEC) 20 MG tablet Take 20 mg by mouth daily.   Marland Kitchen esomeprazole (NEXIUM) 20 MG packet Take 20 mg by mouth daily before breakfast.  . flecainide (TAMBOCOR) 100 MG tablet Take 1 tablet (100 mg total) by mouth 2 (two) times daily.  . rivaroxaban (XARELTO) 20 MG TABS tablet Take 20 mg by mouth daily.  . tamsulosin (FLOMAX) 0.4 MG CAPS capsule Take 0.4 mg by mouth daily.   . Vitamin D, Ergocalciferol, (DRISDOL) 1.25 MG (50000 UNIT) CAPS capsule Take 50,000 Units by mouth every 30 (thirty) days.     Allergies:   Ciprofloxacin   Social History   Socioeconomic History  . Marital status: Married    Spouse name: Not on file  . Number of children: Not on file  . Years of education: Not on file  . Highest education level: Not on file  Occupational History  . Not on file  Tobacco Use  . Smoking status: Never Smoker  . Smokeless tobacco: Never Used  Substance and Sexual Activity  . Alcohol use: No    Alcohol/week: 0.0 standard drinks  . Drug use: No  .  Sexual activity: Not on file  Other Topics Concern  . Not on file  Social History Narrative  . Not on file   Social Determinants of Health   Financial Resource Strain: Not on file  Food Insecurity: Not on file  Transportation Needs: Not on file  Physical Activity: Not on file  Stress: Not on file  Social Connections: Not on file     Family History: The patient's family history includes Atrial fibrillation in his mother; Cancer in his father; Heart disease in his father; Prostate cancer in an other family member.  ROS:   Please see the history of present illness.    All other systems  reviewed and are negative.  EKGs/Labs/Other Studies Reviewed:    The following studies were reviewed today:  01/11/2019 SPECT  Nuclear stress EF: 55%.  The left ventricular ejection fraction is normal (55-65%).  There was no ST segment deviation noted during stress.  The study is normal.  This is a low risk study.  Normal stress nuclear study with no ischemia or infarction; EF 55 with normal wall motion.   04/08/2020 ECG AF     EKG:  The ekg ordered today demonstrates incomplete right bundle branch block, left anterior fascicular block.  Sinus rhythm.  Recent Labs: 04/08/2020: Hemoglobin 15.3; Platelets 224 04/11/2020: ALT 11; BUN 20; Creatinine, Ser 1.23; Potassium 4.3; Sodium 143  Recent Lipid Panel No results found for: CHOL, TRIG, HDL, CHOLHDL, VLDL, LDLCALC, LDLDIRECT  Physical Exam:    VS:  BP (!) 142/84   Pulse 65   Ht 5\' 7"  (1.702 m)   Wt 196 lb (88.9 kg)   SpO2 97%   BMI 30.70 kg/m     Wt Readings from Last 3 Encounters:  02/10/21 196 lb (88.9 kg)  01/27/21 194 lb (88 kg)  04/18/20 206 lb 3.2 oz (93.5 kg)     GEN:  Well nourished, well developed in no acute distress HEENT: Normal NECK: No JVD; No carotid bruits LYMPHATICS: No lymphadenopathy CARDIAC: RRR, no murmurs, rubs, gallops RESPIRATORY:  Clear to auscultation without rales, wheezing or rhonchi  ABDOMEN: Soft, non-tender, non-distended MUSCULOSKELETAL:  No edema; No deformity  SKIN: Warm and dry NEUROLOGIC:  Alert and oriented x 3 PSYCHIATRIC:  Normal affect   ASSESSMENT:    1. Paroxysmal A-fib (HCC)   2. OSA (obstructive sleep apnea)    PLAN:    In order of problems listed above:  1. Paroxysmal atrial fibrillation Highly symptomatic paroxysms of atrial fibrillation.  Has previously been managed with flecainide but this is not effective.  He also has baseline conduction disease which makes flecainide not a suitable long-term solution.  I discussed the available treatment options  including an alternative antiarrhythmic medication versus ablation.  We discussed the pros and cons of each.  I discussed the risks associated with ablation and antiarrhythmic therapy and I discussed the expected recovery time after ablation.  He would like to proceed with scheduling an ablation.  He will need an updated echocardiogram prior to the procedure in addition to the CT pulmonary vein protocol.   Risk, benefits, and alternatives to EP study and radiofrequency ablation for afib were also discussed in detail today. These risks include but are not limited to stroke, bleeding, vascular damage, tamponade, perforation, damage to the esophagus, lungs, and other structures, pulmonary vein stenosis, worsening renal function, and death. The patient understands these risk and wishes to proceed.  We will therefore proceed with catheter ablation at the next available time.  Carto, ICE, anesthesia are requested for the procedure.  Will also obtain CT PV protocol prior to the procedure to exclude LAA thrombus and further evaluate atrial anatomy.   2.  Obstructive sleep apnea Not currently on CPAP due to intolerance of the mask.  Has been referred for the inspire device with ENT.  I spent time during today's visit discussing the link between obstructive sleep apnea and atrial fibrillation, specifically its impact on the success rates of atrial fibrillation ablation.    Medication Adjustments/Labs and Tests Ordered: Current medicines are reviewed at length with the patient today.  Concerns regarding medicines are outlined above.  Orders Placed This Encounter  Procedures  . EKG 12-Lead   No orders of the defined types were placed in this encounter.    Signed, Steffanie Dunn, MD, [facility]  02/10/2021 10:11 AM    Electrophysiology  [facility]

## 2021-02-10 NOTE — Patient Instructions (Addendum)
Medication Instructions:  Your physician recommends that you continue on your current medications as directed. Please refer to the Current Medication list given to you today.  Labwork: None ordered.  Testing/Procedures: None ordered.  Follow-Up:  SEE INSTRUCTION LETTER  Any Other Special Instructions Will Be Listed Below (If Applicable).  If you need a refill on your cardiac medications before your next appointment, please call your pharmacy.     Cardiac electrophysiology: From cell to bedside (7th ed., pp. 1239-1252). [place], [place]: Elsevier.">  Cardiac Ablation Cardiac ablation is a procedure to destroy, or ablate, a small amount of heart tissue in very specific places. The heart has many electrical connections. Sometimes these connections are abnormal and can cause the heart to beat very fast or irregularly. Ablating some of the areas that cause problems can improve the heart's rhythm or return it to normal. Ablation may be done for people who:  Have Wolff-Parkinson-White syndrome.  Have fast heart rhythms (tachycardia).  Have taken medicines for an abnormal heart rhythm (arrhythmia) that were not effective or caused side effects.  Have a high-risk heartbeat that may be life-threatening. During the procedure, a small incision is made in the neck or the groin, and a long, thin tube (catheter) is inserted into the incision and moved to the heart. Small devices (electrodes) on the tip of the catheter will send out electrical currents. A type of X-ray (fluoroscopy) will be used to help guide the catheter and to provide images of the heart. Tell a health care provider about:  Any allergies you have.  All medicines you are taking, including vitamins, herbs, eye drops, creams, and over-the-counter medicines.  Any problems you or family members have had with anesthetic medicines.  Any blood disorders you have.  Any surgeries you have had.  Any medical conditions you have,  such as kidney failure.  Whether you are pregnant or may be pregnant. What are the risks? Generally, this is a safe procedure. However, problems may occur, including:  Infection.  Bruising and bleeding at the catheter insertion site.  Bleeding into the chest, especially into the sac that surrounds the heart. This is a serious complication.  Stroke or blood clots.  Damage to nearby structures or organs.  Allergic reaction to medicines or dyes.  Need for a permanent pacemaker if the normal electrical system is damaged. A pacemaker is a small computer that sends electrical signals to the heart and helps your heart beat normally.  The procedure not being fully effective. This may not be recognized until months later. Repeat ablation procedures are sometimes done. What happens before the procedure? Medicines Ask your health care provider about:  Changing or stopping your regular medicines. This is especially important if you are taking diabetes medicines or blood thinners.  Taking medicines such as aspirin and ibuprofen. These medicines can thin your blood. Do not take these medicines unless your health care provider tells you to take them.  Taking over-the-counter medicines, vitamins, herbs, and supplements. General instructions  Follow instructions from your health care provider about eating or drinking restrictions.  Plan to have someone take you home from the hospital or clinic.  If you will be going home right after the procedure, plan to have someone with you for 24 hours.  Ask your health care provider what steps will be taken to prevent infection. What happens during the procedure?  An IV will be inserted into one of your veins.  You will be given a medicine to help you relax (  sedative).  The skin on your neck or groin will be numbed.  An incision will be made in your neck or your groin.  A needle will be inserted through the incision and into a large vein in your  neck or groin.  A catheter will be inserted into the needle and moved to your heart.  Dye may be injected through the catheter to help your surgeon see the area of the heart that needs treatment.  Electrical currents will be sent from the catheter to ablate heart tissue in desired areas. There are three types of energy that may be used to do this: ? Heat (radiofrequency energy). ? Laser energy. ? Extreme cold (cryoablation).  When the tissue has been ablated, the catheter will be removed.  Pressure will be held on the insertion area to prevent a lot of bleeding.  A bandage (dressing) will be placed over the insertion area. The exact procedure may vary among health care providers and hospitals.   What happens after the procedure?  Your blood pressure, heart rate, breathing rate, and blood oxygen level will be monitored until you leave the hospital or clinic.  Your insertion area will be monitored for bleeding. You will need to lie still for a few hours to ensure that you do not bleed from the insertion area.  Do not drive for 24 hours or as long as told by your health care provider. Summary  Cardiac ablation is a procedure to destroy, or ablate, a small amount of heart tissue using an electrical current. This procedure can improve the heart rhythm or return it to normal.  Tell your health care provider about any medical conditions you may have and all medicines you are taking to treat them.  This is a safe procedure, but problems may occur. Problems may include infection, bruising, damage to nearby organs or structures, or allergic reactions to medicines.  Follow your health care provider's instructions about eating and drinking before the procedure. You may also be told to change or stop some of your medicines.  After the procedure, do not drive for 24 hours or as long as told by your health care provider. This information is not intended to replace advice given to you by your  health care provider. Make sure you discuss any questions you have with your health care provider. Document Revised: 10/22/2019 Document Reviewed: 10/22/2019 Elsevier Patient Education  2021 Elsevier Inc.      

## 2021-03-06 ENCOUNTER — Other Ambulatory Visit: Payer: Self-pay

## 2021-03-06 ENCOUNTER — Encounter (INDEPENDENT_AMBULATORY_CARE_PROVIDER_SITE_OTHER): Payer: Self-pay

## 2021-03-06 ENCOUNTER — Ambulatory Visit (HOSPITAL_COMMUNITY): Payer: Medicare Other | Attending: Internal Medicine

## 2021-03-06 ENCOUNTER — Other Ambulatory Visit: Payer: Medicare Other | Admitting: *Deleted

## 2021-03-06 DIAGNOSIS — I480 Paroxysmal atrial fibrillation: Secondary | ICD-10-CM

## 2021-03-06 DIAGNOSIS — G4733 Obstructive sleep apnea (adult) (pediatric): Secondary | ICD-10-CM | POA: Diagnosis not present

## 2021-03-06 LAB — CBC WITH DIFFERENTIAL/PLATELET
Basophils Absolute: 0.1 10*3/uL (ref 0.0–0.2)
Basos: 1 %
EOS (ABSOLUTE): 0.2 10*3/uL (ref 0.0–0.4)
Eos: 3 %
Hematocrit: 39.7 % (ref 37.5–51.0)
Hemoglobin: 13.3 g/dL (ref 13.0–17.7)
Immature Grans (Abs): 0 10*3/uL (ref 0.0–0.1)
Immature Granulocytes: 0 %
Lymphocytes Absolute: 1.9 10*3/uL (ref 0.7–3.1)
Lymphs: 32 %
MCH: 28.3 pg (ref 26.6–33.0)
MCHC: 33.5 g/dL (ref 31.5–35.7)
MCV: 85 fL (ref 79–97)
Monocytes Absolute: 0.6 10*3/uL (ref 0.1–0.9)
Monocytes: 10 %
Neutrophils Absolute: 3.3 10*3/uL (ref 1.4–7.0)
Neutrophils: 54 %
Platelets: 219 10*3/uL (ref 150–450)
RBC: 4.7 x10E6/uL (ref 4.14–5.80)
RDW: 12.1 % (ref 11.6–15.4)
WBC: 6 10*3/uL (ref 3.4–10.8)

## 2021-03-06 LAB — BASIC METABOLIC PANEL WITH GFR
BUN/Creatinine Ratio: 16 (ref 10–24)
BUN: 18 mg/dL (ref 8–27)
CO2: 23 mmol/L (ref 20–29)
Calcium: 9.5 mg/dL (ref 8.6–10.2)
Chloride: 104 mmol/L (ref 96–106)
Creatinine, Ser: 1.13 mg/dL (ref 0.76–1.27)
Glucose: 98 mg/dL (ref 65–99)
Potassium: 4.7 mmol/L (ref 3.5–5.2)
Sodium: 142 mmol/L (ref 134–144)
eGFR: 71 mL/min/1.73

## 2021-03-06 LAB — ECHOCARDIOGRAM COMPLETE
Area-P 1/2: 3.17 cm2
S' Lateral: 2.7 cm

## 2021-03-16 ENCOUNTER — Telehealth (HOSPITAL_COMMUNITY): Payer: Self-pay | Admitting: *Deleted

## 2021-03-16 NOTE — Telephone Encounter (Signed)
Reaching out to patient to offer assistance regarding upcoming cardiac imaging study; pt verbalizes understanding of appt date/time, parking situation and where to check in, pre-test NPO status, and verified current allergies; name and call back number provided for further questions should they arise  Kura Bethards RN Navigator Cardiac Imaging [facility] [phone] office [phone] cell  

## 2021-03-17 ENCOUNTER — Other Ambulatory Visit: Payer: Self-pay

## 2021-03-17 ENCOUNTER — Ambulatory Visit (HOSPITAL_COMMUNITY)
Admission: RE | Admit: 2021-03-17 | Discharge: 2021-03-17 | Disposition: A | Discharge status: Home / Self Care | Payer: Medicare Other | Source: Ambulatory Visit | Attending: Cardiology | Admitting: Cardiology

## 2021-03-17 DIAGNOSIS — I480 Paroxysmal atrial fibrillation: Secondary | ICD-10-CM

## 2021-03-17 MED ORDER — IOHEXOL 350 MG/ML SOLN
80.0000 mL | Freq: Once | INTRAVENOUS | Status: AC | PRN
  Administered 2021-03-17: 80 mL via INTRAVENOUS

## 2021-03-18 ENCOUNTER — Telehealth: Payer: Self-pay | Admitting: Pharmacist

## 2021-03-18 ENCOUNTER — Telehealth: Payer: Self-pay

## 2021-03-18 ENCOUNTER — Telehealth (INDEPENDENT_AMBULATORY_CARE_PROVIDER_SITE_OTHER): Payer: Medicare Other | Admitting: Cardiology

## 2021-03-18 ENCOUNTER — Other Ambulatory Visit: Payer: Self-pay

## 2021-03-18 DIAGNOSIS — I480 Paroxysmal atrial fibrillation: Secondary | ICD-10-CM

## 2021-03-18 DIAGNOSIS — K449 Diaphragmatic hernia without obstruction or gangrene: Secondary | ICD-10-CM | POA: Diagnosis not present

## 2021-03-18 NOTE — Progress Notes (Signed)
Virtual Visit via Telephone Note   This visit type was conducted due to national recommendations for restrictions regarding the COVID-19 Pandemic (e.g. social distancing) in an effort to limit this patient's exposure and mitigate transmission in our community.  Due to his co-morbid illnesses, this patient is at least at moderate risk for complications without adequate follow up.  This format is felt to be most appropriate for this patient at this time.  The patient did not have access to video technology/had technical difficulties with video requiring transitioning to audio format only (telephone).  All issues noted in this document were discussed and addressed.  No physical exam could be performed with this format.  Please refer to the patient's chart for his  consent to telehealth for [facility].   Date:  03/18/2021   ID:  Tanner Rich, DOB November 21, 1956, MRN [id number] The patient was identified using 2 identifiers.  Patient Location: Home Provider Location: Office/Clinic   PCP:  Marcell Anger, NP   [facility]  Cardiologist:  Charlton Haws, MD  Advanced Practice Provider:  No care team member to display Electrophysiologist:  Lanier Prude, MD  Evaluation Performed:  Follow-Up Visit  Chief Complaint:  AF  History of Present Illness:    Tanner Rich is a 65 y.o. male with AF  The patient does not have symptoms concerning for COVID-19 infection (fever, chills, cough, or new shortness of breath).    Past Medical History:  Diagnosis Date  . A-fib (HCC)   . Complication of anesthesia    "waking up, throat closed up"  . CVA (cerebral vascular accident) (HCC)   . GERD (gastroesophageal reflux disease)   . HLD (hyperlipidemia)   . HTN (hypertension)   . Obesity   . OSA (obstructive sleep apnea)   . OSA (obstructive sleep apnea) 05/07/2015   Past Surgical History:  Procedure Laterality Date  . CARDIOVERSION N/A 06/03/2015   Procedure:  CARDIOVERSION;  Surgeon: Wendall Stade, MD;  Location: [facility] ENDOSCOPY;  Service: Cardiovascular;  Laterality: N/A;  . CARDIOVERSION N/A 05/11/2017   Procedure: CARDIOVERSION;  Surgeon: Wendall Stade, MD;  Location: [facility] ENDOSCOPY;  Service: Cardiovascular;  Laterality: N/A;  . CARDIOVERSION N/A 04/11/2020   Procedure: CARDIOVERSION;  Surgeon: Pricilla Riffle, MD;  Location: [facility] ENDOSCOPY;  Service: Cardiovascular;  Laterality: N/A;  . TONSILLECTOMY       No outpatient medications have been marked as taking for the 03/18/21 encounter (Appointment) with Lanier Prude, MD.     Allergies:   Ciprofloxacin   Social History   Tobacco Use  . Smoking status: Never Smoker  . Smokeless tobacco: Never Used  Substance Use Topics  . Alcohol use: No    Alcohol/week: 0.0 standard drinks  . Drug use: No     Family Hx: The patient's family history includes Atrial fibrillation in his mother; Cancer in his father; Heart disease in his father; Prostate cancer in an other family member.  ROS:   Please see the history of present illness.     All other systems reviewed and are negative.   Prior CV studies:   The following studies were reviewed today:  March 17, 2021 CT cardiac IMPRESSION: 1.  Moderate LAE normal RA No LAA thrombus 2.  Normal PV anatomy with common ostium RLPV/RMPV 3.  No ASD/PFO 4.  Normal aortic root 3.7 cm 5.  No pericardial effusion 6.  Calcium score 56 which is 41 st percentile for age /  sex 7. Very large hiatal hernia that impinges on the lateral LA wall and is adjacent to the LLPV ostium   Labs/Other Tests and Data Reviewed:    EKG:  No ECG reviewed.  Recent Labs: 04/11/2020: ALT 11 03/06/2021: BUN 18; Creatinine, Ser 1.13; Hemoglobin 13.3; Platelets 219; Potassium 4.7; Sodium 142   Recent Lipid Panel No results found for: CHOL, TRIG, HDL, CHOLHDL, LDLCALC, LDLDIRECT  Wt Readings from Last 3 Encounters:  02/10/21 196 lb (88.9 kg)  01/27/21 194 lb (88 kg)   04/18/20 206 lb 3.2 oz (93.5 kg)      Objective:    Vital Signs:  There were no vitals taken for this visit.   VITAL SIGNS:  reviewed PSYCH:  normal affect  ASSESSMENT & PLAN:    1. Paroxysmal atrial fibrillation Patient with symptomatic paroxysmal atrial fibrillation despite treatment with flecainide.  I had previously seen the patient and plan to do a pulmonary vein isolation procedure but the preprocedure CT scan demonstrates a very large hiatal hernia that is adjacent to and compressing part of the left atrium.  The presence of this hiatal hernia in close proximity to the left pulmonary veins precludes safe ablation.  I am unable to monitor the temperature in this hernia sac during ablation.  Given the size of the hernia and the proximity to the pulmonary veins I am also wondering whether the hernia is contributing to the atrial fibrillation.    For the above reasons, I have canceled the PVI.  We will proceed with using dofetilide to manage his atrial fibrillation.  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  We will work with the atrial fibrillation clinic to get his dofetilide loading process started.  This will likely happen after surgical plan for the hernia has been decided upon.  2.  Hiatal hernia For the hiatal hernia, I will refer him to Dr. Cliffton Asters.  If the hernia is amenable to safe repair, would favor first repairing the hernia and then addressing the atrial fibrillation with a PVI.          Time:   Today, I have spent 25 minutes with the patient with telehealth technology discussing the above problems.     Medication Adjustments/Labs and Tests Ordered: Current medicines are reviewed at length with the patient today.  Concerns regarding medicines are outlined above.   Tests Ordered: No orders of the defined types were placed in this encounter.   Medication  Changes: No orders of the defined types were placed in this encounter.   Signed, Lanier Prude, MD  03/18/2021 1:06 PM    [facility]

## 2021-03-18 NOTE — Telephone Encounter (Signed)
Outreach made to Pt.  Per Dr. Lalla Brothers- schedule virtual visit today to discuss cardiac CT results.  Pt agrees to virtual visit at 1:00 pm.  [name], eckert are scheduled for a virtual visit with your provider today.    Just as we do with appointments in the office, we must obtain your consent to participate.  Your consent will be active for this visit and any virtual visit you may have with one of our providers in the next 365 days.    If you have a MyChart account, I can also send a copy of this consent to you electronically.  All virtual visits are billed to your insurance company just like a traditional visit in the office.  As this is a virtual visit, video technology does not allow for your provider to perform a traditional examination.  This may limit your provider's ability to fully assess your condition.  If your provider identifies any concerns that need to be evaluated in person or the need to arrange testing such as labs, EKG, etc, we will make arrangements to do so.    Although advances in technology are sophisticated, we cannot ensure that it will always work on either your end or our end.  If the connection with a video visit is poor, we may have to switch to a telephone visit.  With either a video or telephone visit, we are not always able to ensure that we have a secure connection.   I need to obtain your verbal consent now.   Are you willing to proceed with your visit today?   Tanner Rich has provided verbal consent on 03/18/2021 for a virtual visit (video or telephone).   Wiliam Ke, RN 03/18/2021  10:21 AM

## 2021-03-18 NOTE — Telephone Encounter (Signed)
Medication list reviewed in anticipation of upcoming Tikosyn initiation. Patient is not taking any contraindicated or QTc prolonging medications. Flecainide was stopped today.  Patient is anticoagulated on Xarelto 20mg  daily on the appropriate dose. Please ensure that patient has not missed any anticoagulation doses in the 3 weeks prior to Tikosyn initiation.   Patient will need to be counseled to avoid use of Benadryl while on Tikosyn and in the 2-3 days prior to Tikosyn initiation.

## 2021-03-18 NOTE — Patient Instructions (Addendum)
Medication Instructions:  Your physician has recommended you make the following change in your medication:   1.  STOP taking flecainide  *If you need a refill on your cardiac medications before your next appointment, please call your pharmacy*  Lab Work: None ordered. If you have labs (blood work) drawn today and your tests are completely normal, you will receive your results only by: Marland Kitchen MyChart Message (if you have MyChart) OR . A paper copy in the mail If you have any lab test that is abnormal or we need to change your treatment, we will call you to review the results.  Testing/Procedures: None ordered.  Follow-Up: At [facility], you and your health needs are our priority.  As part of our continuing mission to provide you with exceptional heart care, we have created designated Provider Care Teams.  These Care Teams include your primary Cardiologist (physician) and Advanced Practice Providers (APPs -  Physician Assistants and Nurse Practitioners) who all work together to provide you with the care you need, when you need it.  Your next appointment:    AFIB CLINIC INFORMATION: Your appointment is scheduled on: ____________ at _____________. Please arrive 15 minutes early for check-in. The AFib Clinic is located in the Heart and Vascular Specialty Clinics at [facility]. Parking instructions/directions: [address] social research [address] ([address]). When you pull in to Entrance C, there is an underground parking garage to your right. The code to enter the garage is _____________. Take the elevators to the first floor. Follow the signs to the Heart and Vascular Specialty Clinics. You will see registration at the end of the hallway.  Phone number: [phone]   Dofetilide capsules What is this medicine? DOFETILIDE (doe FET il ide) is an antiarrhythmic drug. It helps make your heart beat regularly. This medicine also helps to slow rapid heartbeats. This medicine may be used for other  purposes; ask your health care provider or pharmacist if you have questions. COMMON BRAND NAME(S): Tikosyn What should I tell my health care provider before I take this medicine? They need to know if you have any of these conditions:  heart disease  history of irregular heartbeat  history of low levels of potassium or magnesium in the blood  kidney disease  liver disease  an unusual or allergic reaction to dofetilide, other medicines, foods, dyes, or preservatives  pregnant or trying to get pregnant  breast-feeding How should I use this medicine? Take this medicine by mouth with a glass of water. Follow the directions on the prescription label. Do not take with grapefruit juice. You can take it with or without food. If it upsets your stomach, take it with food. Take your medicine at regular intervals. Do not take it more often than directed. Do not stop taking except on your doctor's advice. A special MedGuide will be given to you by the pharmacist with each prescription and refill. Be sure to read this information carefully each time. Talk to your pediatrician regarding the use of this medicine in children. Special care may be needed. Overdosage: If you think you have taken too much of this medicine contact a poison control center or emergency room at once. NOTE: This medicine is only for you. Do not share this medicine with others. What if I miss a dose? If you miss a dose, skip it. Take your next dose at the normal time. Do not take extra or 2 doses at the same time to make up for the missed dose. What  may interact with this medicine? Do not take this medicine with any of the following medications:  cimetidine  cisapride  dolutegravir  dronedarone  erdafitinib  hydrochlorothiazide  ketoconazole  megestrol  pimozide  prochlorperazine  thioridazine  trimethoprim  verapamil This medicine may also interact with the following  medications:  amiloride  cannabinoids  certain antibiotics like erythromycin or clarithromycin  certain antiviral medicines for HIV or hepatitis  certain medicines for depression, anxiety, or psychotic disorders  digoxin  diltiazem  grapefruit juice  metformin  nefazodone  other medicines that prolong the QT interval (an abnormal heart rhythm)  quinine  triamterene  zafirlukast  ziprasidone This list may not describe all possible interactions. Give your health care provider a list of all the medicines, herbs, non-prescription drugs, or dietary supplements you use. Also tell them if you smoke, drink alcohol, or use illegal drugs. Some items may interact with your medicine. What should I watch for while using this medicine? Your condition will be monitored carefully while you are receiving this medicine. What side effects may I notice from receiving this medicine? Side effects that you should report to your doctor or health care professional as soon as possible:  allergic reactions like skin rash, itching or hives, swelling of the face, lips, or tongue  breathing problems  chest pain or chest tightness  dizziness  signs and symptoms of a dangerous change in heartbeat or heart rhythm like chest pain; dizziness; fast or irregular heartbeat; palpitations; feeling faint or lightheaded, falls; breathing problems  signs and symptoms of electrolyte imbalance like severe diarrhea, unusual sweating, vomiting, loss of appetite, increased thirst  swelling of the ankles, legs, or feet  tingling, numbness in the hands or feet Side effects that usually do not require medical attention (report to your doctor or health care professional if they continue or are bothersome):  diarrhea  general ill feeling or flu-like symptoms  headache  nausea  trouble sleeping  stomach pain This list may not describe all possible side effects. Call your doctor for medical advice about  side effects. You may report side effects to FDA at 1-800-FDA-1088. Where should I keep my medicine? Keep out of the reach of children. Store at room temperature between 15 and 30 degrees C (59 and 86 degrees F). Throw away any unused medicine after the expiration date. NOTE: This sheet is a summary. It may not cover all possible information. If you have questions about this medicine, talk to your doctor, pharmacist, or health care provider.  2021 Elsevier/Gold Standard (2019-11-13 12:14:05)

## 2021-03-19 ENCOUNTER — Telehealth: Payer: Self-pay | Admitting: Cardiology

## 2021-03-19 NOTE — Telephone Encounter (Signed)
Per avs patient to fu at Afib clinic .   [place] - can you help with this please.

## 2021-03-20 ENCOUNTER — Other Ambulatory Visit (HOSPITAL_COMMUNITY): Payer: Medicare Other

## 2021-03-24 ENCOUNTER — Encounter (HOSPITAL_COMMUNITY): Admission: RE | Payer: Self-pay | Source: Home / Self Care

## 2021-03-24 ENCOUNTER — Ambulatory Visit (HOSPITAL_COMMUNITY): Admission: RE | Admit: 2021-03-24 | Payer: Medicare Other | Source: Home / Self Care | Admitting: Cardiology

## 2021-03-24 SURGERY — ATRIAL FIBRILLATION ABLATION
Anesthesia: General

## 2021-03-27 ENCOUNTER — Institutional Professional Consult (permissible substitution) (INDEPENDENT_AMBULATORY_CARE_PROVIDER_SITE_OTHER): Payer: Medicare Other | Admitting: Thoracic Surgery (Cardiothoracic Vascular Surgery)

## 2021-03-27 ENCOUNTER — Other Ambulatory Visit: Payer: Self-pay

## 2021-03-27 VITALS — BP 150/90 | HR 72 | Resp 20 | Ht 67.0 in | Wt 205.0 lb

## 2021-03-27 DIAGNOSIS — K449 Diaphragmatic hernia without obstruction or gangrene: Secondary | ICD-10-CM

## 2021-03-27 DIAGNOSIS — N419 Inflammatory disease of prostate, unspecified: Secondary | ICD-10-CM | POA: Insufficient documentation

## 2021-03-27 NOTE — H&P (View-Only) (Signed)
    [address]       [place] [address]             [phone]                    Tanner Rich [place] Medical Record #[id number] Date of Birth: 06/23/1956  Referring: Lambert, Cameron T, MD Primary Care: Clark, Candice P, NP Primary Cardiologist: Peter Nishan, MD  Chief Complaint:    Chief Complaint  Patient presents with  . Hiatal Hernia    Surgical consult,  Cardiac CT 03/17/21    History of Present Illness:    Tanner Rich 65 y.o. male referred for surgical evaluation of a hiatal hernia.  He has a long history of atrial fibrillation and is been followed by Dr. Lambert.  Other plans for him to undergo a pulmonary vein isolation but prior to this, Dr. Lambert would like his hiatal hernia fixed.  In regards to his symptoms he states that he has pretty significant reflux and is unable to go without his Nexium.  This has been the case for the last 3 to 4 years.  He denies any dysphagia.  He does not have a chronic cough or has not had any recurrent upper respiratory infections or pneumonias.      Zubrod Score: At the time of surgery this patient's most appropriate activity status/level should be described as: [x]    0    Normal activity, no symptoms []    1    Restricted in physical strenuous activity but ambulatory, able to do out light work []    2    Ambulatory and capable of self care, unable to do work activities, up and about               >50 % of waking hours                              []    3    Only limited self care, in bed greater than 50% of waking hours []    4    Completely disabled, no self care, confined to bed or chair []    5    Moribund   Past Medical History:  Diagnosis Date  . A-fib (HCC)   . Complication of anesthesia    "waking up, throat closed up"  . CVA (cerebral vascular accident) (HCC)   . GERD (gastroesophageal reflux disease)   . HLD (hyperlipidemia)   . HTN (hypertension)   . Obesity   . OSA (obstructive sleep  apnea)   . OSA (obstructive sleep apnea) 05/07/2015    Past Surgical History:  Procedure Laterality Date  . CARDIOVERSION N/A 06/03/2015   Procedure: CARDIOVERSION;  Surgeon: Peter C Nishan, MD;  Location: [facility] ENDOSCOPY;  Service: Cardiovascular;  Laterality: N/A;  . CARDIOVERSION N/A 05/11/2017   Procedure: CARDIOVERSION;  Surgeon: Nishan, Peter C, MD;  Location: [facility] ENDOSCOPY;  Service: Cardiovascular;  Laterality: N/A;  . CARDIOVERSION N/A 04/11/2020   Procedure: CARDIOVERSION;  Surgeon: Ross, Paula V, MD;  Location: [facility] ENDOSCOPY;  Service: Cardiovascular;  Laterality: N/A;  . TONSILLECTOMY      Family History  Problem Relation Age of Onset  . Atrial fibrillation Mother   . Cancer Father        BONE  . Heart disease Father   . Prostate cancer Other        Social History   Tobacco Use  Smoking Status Never Smoker  Smokeless Tobacco Never Used    Social History   Substance and Sexual Activity  Alcohol Use No  . Alcohol/week: 0.0 standard drinks     Allergies  Allergen Reactions  . Ciprofloxacin Swelling    Pt states he took med for 3 days and ankle swelling started. Pt states he took med for 3 days and ankle swelling started.     Current Outpatient Medications  Medication Sig Dispense Refill  . enalapril (VASOTEC) 20 MG tablet Take 20 mg by mouth daily.     Marland Kitchen esomeprazole (NEXIUM) 20 MG packet Take 20 mg by mouth daily before breakfast.    . flecainide (TAMBOCOR) 100 MG tablet Take 100 mg by mouth 2 (two) times daily.    . rivaroxaban (XARELTO) 20 MG TABS tablet Take 20 mg by mouth daily.    . tamsulosin (FLOMAX) 0.4 MG CAPS capsule Take 0.4 mg by mouth daily.    . Vitamin D, Ergocalciferol, (DRISDOL) 1.25 MG (50000 UNIT) CAPS capsule Take 50,000 Units by mouth every 30 (thirty) days.     No current facility-administered medications for this visit.    Review of Systems  Constitutional: Positive for malaise/fatigue.  Gastrointestinal: Positive for heartburn.  Negative for abdominal pain, nausea and vomiting.  Neurological: Negative.      PHYSICAL EXAMINATION: BP (!) 150/90   Pulse 72   Resp 20   Ht 5\' 7"  (1.702 m)   Wt 205 lb (93 kg)   SpO2 97% Comment: RA  BMI 32.11 kg/m  Physical Exam Constitutional:      General: He is not in acute distress.    Appearance: Normal appearance. He is normal weight. He is not ill-appearing.  Eyes:     Extraocular Movements: Extraocular movements intact.  Cardiovascular:     Rate and Rhythm: Normal rate. Rhythm irregular.  Pulmonary:     Effort: Pulmonary effort is normal. No respiratory distress.  Abdominal:     General: Abdomen is flat. There is no distension.  Musculoskeletal:        General: Normal range of motion.     Cervical back: Normal range of motion.  Skin:    General: Skin is warm and dry.  Neurological:     General: No focal deficit present.     Mental Status: He is alert and oriented to person, place, and time.          I have independently reviewed the above radiology studies  and reviewed the findings with the patient.   Recent Lab Findings: Lab Results  Component Value Date   WBC 6.0 03/06/2021   HGB 13.3 03/06/2021   HCT 39.7 03/06/2021   PLT 219 03/06/2021   GLUCOSE 98 03/06/2021   ALT 11 04/11/2020   AST 18 04/11/2020   NA 142 03/06/2021   K 4.7 03/06/2021   CL 104 03/06/2021   CREATININE 1.13 03/06/2021   BUN 18 03/06/2021   CO2 23 03/06/2021   TSH 1.563 05/09/2017      Assessment / Plan:   65 year old male with chronic atrial fibrillation who is scheduled to undergo pulmonary vein isolation.  He also has a fairly sizable hiatal hernia with intrathoracic stomach extending up to the level of the left atrium.  This has been present since his abdominal CT in 2018, and is redemonstrated on his coronary CT.  He also has significant reflux and is chronically on Nexium.  We discussed the  risks and benefits of a robotic assisted paraesophageal hernia repair with  fundoplication.  He is agreeable to proceed.  He will also undergo an upper endoscopy intraoperatively.     I  spent 40 minutes with the patient face to face counseling and coordination of care.    Corliss Skains 03/27/2021 4:42 PM

## 2021-03-27 NOTE — Progress Notes (Signed)
[address]       [place] [address]             [phone]                    Tanner Rich [facility] Medical Record #[id number] Date of Birth: 1956/06/06  Referring: Tanner Prude, MD Primary Care: Tanner Anger, NP Primary Cardiologist: Tanner Haws, MD  Chief Complaint:    Chief Complaint  Patient presents with  . Hiatal Hernia    Surgical consult,  Cardiac CT 03/17/21    History of Present Illness:    Tanner Rich 65 y.o. male referred for surgical evaluation of a hiatal hernia.  He has a long history of atrial fibrillation and is been followed by Dr. Lalla Rich.  Other plans for him to undergo a pulmonary vein isolation but prior to this, Dr. Lalla Rich would like his hiatal hernia fixed.  In regards to his symptoms he states that he has pretty significant reflux and is unable to go without his Nexium.  This has been the case for the last 3 to 4 years.  He denies any dysphagia.  He does not have a chronic cough or has not had any recurrent upper respiratory infections or pneumonias.      Zubrod Score: At the time of surgery this patient's most appropriate activity status/level should be described as: [x]     0    Normal activity, no symptoms []     1    Restricted in physical strenuous activity but ambulatory, able to do out light work []     2    Ambulatory and capable of self care, unable to do work activities, up and about               >50 % of waking hours                              []     3    Only limited self care, in bed greater than 50% of waking hours []     4    Completely disabled, no self care, confined to bed or chair []     5    Moribund   Past Medical History:  Diagnosis Date  . A-fib (HCC)   . Complication of anesthesia    "waking up, throat closed up"  . CVA (cerebral vascular accident) (HCC)   . GERD (gastroesophageal reflux disease)   . HLD (hyperlipidemia)   . HTN (hypertension)   . Obesity   . OSA (obstructive sleep  apnea)   . OSA (obstructive sleep apnea) 05/07/2015    Past Surgical History:  Procedure Laterality Date  . CARDIOVERSION N/A 06/03/2015   Procedure: CARDIOVERSION;  Surgeon: , MD;  Location: [facility] ENDOSCOPY;  Service: Cardiovascular;  Laterality: N/A;  . CARDIOVERSION N/A 05/11/2017   Procedure: CARDIOVERSION;  Surgeon: , MD;  Location: [facility] ENDOSCOPY;  Service: Cardiovascular;  Laterality: N/A;  . CARDIOVERSION N/A 04/11/2020   Procedure: CARDIOVERSION;  Surgeon: Wendall Stade, MD;  Location: [facility] ENDOSCOPY;  Service: Cardiovascular;  Laterality: N/A;  . TONSILLECTOMY      Family History  Problem Relation Age of Onset  . Atrial fibrillation Mother   . Cancer Father        BONE  . Heart disease Father   . Prostate cancer Other  Social History   Tobacco Use  Smoking Status Never Smoker  Smokeless Tobacco Never Used    Social History   Substance and Sexual Activity  Alcohol Use No  . Alcohol/week: 0.0 standard drinks     Allergies  Allergen Reactions  . Ciprofloxacin Swelling    Pt states he took med for 3 days and ankle swelling started. Pt states he took med for 3 days and ankle swelling started.     Current Outpatient Medications  Medication Sig Dispense Refill  . enalapril (VASOTEC) 20 MG tablet Take 20 mg by mouth daily.     Marland Kitchen esomeprazole (NEXIUM) 20 MG packet Take 20 mg by mouth daily before breakfast.    . flecainide (TAMBOCOR) 100 MG tablet Take 100 mg by mouth 2 (two) times daily.    . rivaroxaban (XARELTO) 20 MG TABS tablet Take 20 mg by mouth daily.    . tamsulosin (FLOMAX) 0.4 MG CAPS capsule Take 0.4 mg by mouth daily.    . Vitamin D, Ergocalciferol, (DRISDOL) 1.25 MG (50000 UNIT) CAPS capsule Take 50,000 Units by mouth every 30 (thirty) days.     No current facility-administered medications for this visit.    Review of Systems  Constitutional: Positive for malaise/fatigue.  Gastrointestinal: Positive for heartburn.  Negative for abdominal pain, nausea and vomiting.  Neurological: Negative.      PHYSICAL EXAMINATION: BP (!) 150/90   Pulse 72   Resp 20   Ht 5\' 7"  (1.702 m)   Wt 205 lb (93 kg)   SpO2 97% Comment: RA  BMI 32.11 kg/m  Physical Exam Constitutional:      General: He is not in acute distress.    Appearance: Normal appearance. He is normal weight. He is not ill-appearing.  Eyes:     Extraocular Movements: Extraocular movements intact.  Cardiovascular:     Rate and Rhythm: Normal rate. Rhythm irregular.  Pulmonary:     Effort: Pulmonary effort is normal. No respiratory distress.  Abdominal:     General: Abdomen is flat. There is no distension.  Musculoskeletal:        General: Normal range of motion.     Cervical back: Normal range of motion.  Skin:    General: Skin is warm and dry.  Neurological:     General: No focal deficit present.     Mental Status: He is alert and oriented to person, place, and time.          I have independently reviewed the above radiology studies  and reviewed the findings with the patient.   Recent Lab Findings: Lab Results  Component Value Date   WBC 6.0 03/06/2021   HGB 13.3 03/06/2021   HCT 39.7 03/06/2021   PLT 219 03/06/2021   GLUCOSE 98 03/06/2021   ALT 11 04/11/2020   AST 18 04/11/2020   NA 142 03/06/2021   K 4.7 03/06/2021   CL 104 03/06/2021   CREATININE 1.13 03/06/2021   BUN 18 03/06/2021   CO2 23 03/06/2021   TSH 1.563 05/09/2017      Assessment / Plan:   65 year old male with chronic atrial fibrillation who is scheduled to undergo pulmonary vein isolation.  He also has a fairly sizable hiatal hernia with intrathoracic stomach extending up to the level of the left atrium.  This has been present since his abdominal CT in 2018, and is redemonstrated on his coronary CT.  He also has significant reflux and is chronically on Nexium.  We discussed the  risks and benefits of a robotic assisted paraesophageal hernia repair with  fundoplication.  He is agreeable to proceed.  He will also undergo an upper endoscopy intraoperatively.     I  spent 40 minutes with the patient face to face counseling and coordination of care.    Corliss Skains 03/27/2021 4:42 PM

## 2021-03-29 ENCOUNTER — Encounter: Payer: Self-pay | Admitting: *Deleted

## 2021-03-29 ENCOUNTER — Other Ambulatory Visit: Payer: Self-pay | Admitting: *Deleted

## 2021-03-29 DIAGNOSIS — K449 Diaphragmatic hernia without obstruction or gangrene: Secondary | ICD-10-CM

## 2021-03-30 ENCOUNTER — Telehealth: Payer: Self-pay | Admitting: Pharmacist

## 2021-03-30 MED ORDER — ENOXAPARIN SODIUM 150 MG/ML ~~LOC~~ SOLN: 150.0000 mg | SUBCUTANEOUS | 0 refills | Status: DC

## 2021-03-30 NOTE — Telephone Encounter (Signed)
Spoke with pt's daughter and relayed Lovenox bridging instructions. Provided her with my direct # in case she has any questions.

## 2021-03-30 NOTE — Telephone Encounter (Signed)
Patients daughter [name] is returning call to [name] from this morning  because pt is not available right now. [phone]

## 2021-03-30 NOTE — Telephone Encounter (Signed)
Called pt to discuss Lovenox bridge for hernia surgery on 4/28. Pt wishes for me to review instructions with his daughter instead. Does not have her # for me to call, states he will have her call our office so that I can review Lovenox instructions with her as below:  4/25 - Last dose of Xarelto in the evening.  4/26 - Inject Lovenox 150mg  SQ in the fatty tissue of the abdomen at 8pm. No Xarelto.  4/27 - No Lovenox, no Xarelto.  4/28 - procedure date. No Lovenox, no Xarelto. Discuss with your surgeon restarting Xarelto tonight vs tomorrow night  Pt also mentioned that dofetilide is not covered under his insurance plan, and that he restarted flecainide because he felt poorly off of it. Looks as though dofetilide is a Tier 4 medication which costs $200/month on his plan. Advised pt that using a GoodRx coupon at 5/28 will bring his monthly copay down to $24 instead which he is fine with. Dofetilide load currently on hold pending upcoming hernia surgery and anticoagulation hold (will need 3 weeks of uninterrupted anticoag afterwards before starting dofetilide). Will forward to Dr Goldman Sachs for input regarding anti arrhythmic therapy in the interim as pt states he felt awful when he stopped flecainide (this was stopped 03/18/21 visit secondary to right bundle branch block and left anterior fascicular block on his recent EKG).

## 2021-03-30 NOTE — Telephone Encounter (Signed)
-----   Message from Davina Poke, RN sent at 03/30/2021  9:49 AM EDT ----- We really don't bridge here in our clinic so I sent it to Afib team and they told me they route it normally to you all.  Do you mind helping?  If not that would be great but if not let me know and we will take care of it!  Thanks so much,  Ryan ----- Message ----- From: Awilda Metro, RPH-CPP Sent: 03/30/2021   9:45 AM EDT To: Davina Poke, RN  Hey Ryan,  Yes I'd bridge him with Lovenox because of his history of both afib and stroke (his CHADS2VASc score is 5). Do you guys usually take care of the bridge or did you want [place] to? He'll really only need 1-2 Lovenox injections since his last Xarelto dose would be 4/25 in the evening. He could either give 1 injection of Lovenox 150mg  (1.5mg /kg once daily dosing) in the evening on 4/26, or he could give 1 injection of Lovenox 100mg  in the evening on 4/26 and another in the AM on 4/27 (1mg /kg BID dosing).   Thanks! Eliana Lueth ----- Message ----- From: 5/26, RN Sent: 03/30/2021   9:33 AM EDT To: Div Pharmd  Hey,   Dr. Davina Poke is operating on this patient 4/28 and we plan to hold Xarelto 2 days prior to srgy.  Dr. Loni Muse wrote down, lovenox bridging.  I am not 100% he will actually need a bridge but I wanted to reach out in case he does.  Can someone review and let me know?  Thanks, Cliffton Asters, 5/28

## 2021-04-20 NOTE — Pre-Procedure Instructions (Signed)
Surgical Instructions    Your procedure is scheduled on Thursday April 28th.  Report to [facility] Main Entrance "A" at 0545 A.M., then check in with the Admitting office.  Call this number if you have problems the morning of surgery:  [phone]   If you have any questions prior to your surgery date call [phone]: Open Monday-Friday 8am-4pm    Remember:  Do not eat or drink after midnight the night before your surgery   Take these medicines the morning of surgery with A SIP OF WATER   tamsulosin (FLOMAX)   flecainide (TAMBOCOR)   esomeprazole (NEXIUM)   Please follow instructions given to you by your provider about stopping Xarelto and bridging with Lovenox.   As of today, STOP taking any Aspirin (unless otherwise instructed by your surgeon) Aleve, Naproxen, Ibuprofen, Motrin, Advil, Goody's, BC's, all herbal medications, fish oil, and all vitamins.                     Do not wear jewelry                       Do not wear lotions, powders, colognes, or deodorant.            Do not shave 48 hours prior to surgery.  Men may shave face and neck.            Do not bring valuables to the hospital.            [facility] is not responsible for any belongings or valuables.  Do NOT Smoke (Tobacco/Vaping) or drink Alcohol 24 hours prior to your procedure If you use a CPAP at night, you may bring all equipment for your overnight stay.   Contacts, glasses, dentures or partials may not be worn into surgery, please bring cases for these belongings   For patients admitted to the hospital, discharge time will be determined by your treatment team.   Patients discharged the day of surgery will not be allowed to drive home, and someone needs to stay with them for 24 hours.    Special instructions:   [place]- Preparing For Surgery  Before surgery, you can play an important role. Because skin is not sterile, your skin needs to be as free of germs as possible. You can reduce the  number of germs on your skin by washing with CHG (chlorahexidine gluconate) Soap before surgery.  CHG is an antiseptic cleaner which kills germs and bonds with the skin to continue killing germs even after washing.    Oral Hygiene is also important to reduce your risk of infection.  Remember - BRUSH YOUR TEETH THE MORNING OF SURGERY WITH YOUR REGULAR TOOTHPASTE  Please do not use if you have an allergy to CHG or antibacterial soaps. If your skin becomes reddened/irritated stop using the CHG.  Do not shave (including legs and underarms) for at least 48 hours prior to first CHG shower. It is OK to shave your face.  Please follow these instructions carefully.   1. Shower the NIGHT BEFORE SURGERY and the MORNING OF SURGERY  2. If you chose to wash your hair, wash your hair first as usual with your normal shampoo.  3. After you shampoo, rinse your hair and body thoroughly to remove the shampoo.  4. Use CHG Soap as you would any other liquid soap. You can apply CHG directly to the skin and wash gently with a scrungie or a clean  washcloth.   5. Apply the CHG Soap to your body ONLY FROM THE NECK DOWN.  Do not use on open wounds or open sores. Avoid contact with your eyes, ears, mouth and genitals (private parts). Wash Face and genitals (private parts)  with your normal soap.   6. Wash thoroughly, paying special attention to the area where your surgery will be performed.  7. Thoroughly rinse your body with warm water from the neck down.  8. DO NOT shower/wash with your normal soap after using and rinsing off the CHG Soap.  9. Pat yourself dry with a CLEAN TOWEL.  10. Wear CLEAN PAJAMAS to bed the night before surgery  11. Place CLEAN SHEETS on your bed the night before your surgery  12. DO NOT SLEEP WITH PETS.   Day of Surgery: Take a shower with CHG soap. Wear Clean/Comfortable clothing the morning of surgery Do not apply any deodorants/lotions.   Remember to brush your teeth WITH YOUR  REGULAR TOOTHPASTE.   Please read over the following fact sheets that you were given.

## 2021-04-21 ENCOUNTER — Encounter (HOSPITAL_COMMUNITY): Payer: Self-pay

## 2021-04-21 ENCOUNTER — Other Ambulatory Visit (HOSPITAL_COMMUNITY)
Admission: RE | Admit: 2021-04-21 | Discharge: 2021-04-21 | Disposition: A | Discharge status: Home / Self Care | Payer: Medicare Other | Source: Ambulatory Visit | Attending: Thoracic Surgery (Cardiothoracic Vascular Surgery) | Admitting: Thoracic Surgery (Cardiothoracic Vascular Surgery)

## 2021-04-21 ENCOUNTER — Other Ambulatory Visit: Payer: Self-pay

## 2021-04-21 ENCOUNTER — Encounter (HOSPITAL_COMMUNITY)
Admission: RE | Admit: 2021-04-21 | Discharge: 2021-04-21 | Disposition: A | Discharge status: Home / Self Care | Payer: Medicare Other | Source: Ambulatory Visit | Attending: Thoracic Surgery (Cardiothoracic Vascular Surgery) | Admitting: Thoracic Surgery (Cardiothoracic Vascular Surgery)

## 2021-04-21 ENCOUNTER — Ambulatory Visit (HOSPITAL_COMMUNITY)
Admission: RE | Admit: 2021-04-21 | Discharge: 2021-04-21 | Disposition: A | Discharge status: Home / Self Care | Payer: Medicare Other | Source: Ambulatory Visit | Attending: Thoracic Surgery (Cardiothoracic Vascular Surgery) | Admitting: Thoracic Surgery (Cardiothoracic Vascular Surgery)

## 2021-04-21 DIAGNOSIS — Z8673 Personal history of transient ischemic attack (TIA), and cerebral infarction without residual deficits: Secondary | ICD-10-CM | POA: Insufficient documentation

## 2021-04-21 DIAGNOSIS — Z01812 Encounter for preprocedural laboratory examination: Secondary | ICD-10-CM | POA: Insufficient documentation

## 2021-04-21 DIAGNOSIS — Z20822 Contact with and (suspected) exposure to covid-19: Secondary | ICD-10-CM | POA: Insufficient documentation

## 2021-04-21 DIAGNOSIS — Z8616 Personal history of COVID-19: Secondary | ICD-10-CM | POA: Insufficient documentation

## 2021-04-21 DIAGNOSIS — Z79899 Other long term (current) drug therapy: Secondary | ICD-10-CM | POA: Insufficient documentation

## 2021-04-21 DIAGNOSIS — G4733 Obstructive sleep apnea (adult) (pediatric): Secondary | ICD-10-CM | POA: Insufficient documentation

## 2021-04-21 DIAGNOSIS — I4891 Unspecified atrial fibrillation: Secondary | ICD-10-CM | POA: Insufficient documentation

## 2021-04-21 DIAGNOSIS — I10 Essential (primary) hypertension: Secondary | ICD-10-CM | POA: Insufficient documentation

## 2021-04-21 DIAGNOSIS — Z01818 Encounter for other preprocedural examination: Secondary | ICD-10-CM

## 2021-04-21 DIAGNOSIS — K449 Diaphragmatic hernia without obstruction or gangrene: Secondary | ICD-10-CM

## 2021-04-21 DIAGNOSIS — I700 Atherosclerosis of aorta: Secondary | ICD-10-CM | POA: Insufficient documentation

## 2021-04-21 HISTORY — DX: Personal history of urinary calculi: Z87.442

## 2021-04-21 HISTORY — DX: Occlusion and stenosis of right carotid artery: I65.21

## 2021-04-21 LAB — URINALYSIS, ROUTINE W REFLEX MICROSCOPIC
Bacteria, UA: NONE SEEN
Bilirubin Urine: NEGATIVE
Glucose, UA: 150 mg/dL — AB
Hgb urine dipstick: NEGATIVE
Ketones, ur: NEGATIVE mg/dL
Nitrite: NEGATIVE
Protein, ur: NEGATIVE mg/dL
Specific Gravity, Urine: 1.026 (ref 1.005–1.030)
pH: 5 (ref 5.0–8.0)

## 2021-04-21 LAB — BLOOD GAS, ARTERIAL
Acid-Base Excess: 0.4 mmol/L (ref 0.0–2.0)
Bicarbonate: 24.2 mmol/L (ref 20.0–28.0)
FIO2: 21
O2 Saturation: 97.8 %
Patient temperature: 37
pCO2 arterial: 37.4 mmHg (ref 32.0–48.0)
pH, Arterial: 7.428 (ref 7.350–7.450)
pO2, Arterial: 112 mmHg — ABNORMAL HIGH (ref 83.0–108.0)

## 2021-04-21 LAB — CBC
HCT: 42.4 % (ref 39.0–52.0)
Hemoglobin: 13.3 g/dL (ref 13.0–17.0)
MCH: 27.4 pg (ref 26.0–34.0)
MCHC: 31.4 g/dL (ref 30.0–36.0)
MCV: 87.4 fL (ref 80.0–100.0)
Platelets: 200 10*3/uL (ref 150–400)
RBC: 4.85 MIL/uL (ref 4.22–5.81)
RDW: 12.7 % (ref 11.5–15.5)
WBC: 5.9 10*3/uL (ref 4.0–10.5)
nRBC: 0 % (ref 0.0–0.2)

## 2021-04-21 LAB — COMPREHENSIVE METABOLIC PANEL
ALT: 10 U/L (ref 0–44)
AST: 21 U/L (ref 15–41)
Albumin: 3.8 g/dL (ref 3.5–5.0)
Alkaline Phosphatase: 62 U/L (ref 38–126)
Anion gap: 8 (ref 5–15)
BUN: 19 mg/dL (ref 8–23)
CO2: 23 mmol/L (ref 22–32)
Calcium: 9.5 mg/dL (ref 8.9–10.3)
Chloride: 106 mmol/L (ref 98–111)
Creatinine, Ser: 1.24 mg/dL (ref 0.61–1.24)
GFR, Estimated: >60 mL/min (ref >60)
Glucose, Bld: 180 mg/dL — ABNORMAL HIGH (ref 70–99)
Potassium: 3.7 mmol/L (ref 3.5–5.1)
Sodium: 137 mmol/L (ref 135–145)
Total Bilirubin: 1.5 mg/dL — ABNORMAL HIGH (ref 0.3–1.2)
Total Protein: 6.8 g/dL (ref 6.5–8.1)

## 2021-04-21 LAB — TYPE AND SCREEN
ABO/RH(D): A POS
Antibody Screen: NEGATIVE

## 2021-04-21 LAB — SURGICAL PCR SCREEN
MRSA, PCR: NEGATIVE
Staphylococcus aureus: NEGATIVE

## 2021-04-21 LAB — PROTIME-INR
INR: 1.2 (ref 0.8–1.2)
Prothrombin Time: 15.4 seconds — ABNORMAL HIGH (ref 11.4–15.2)

## 2021-04-21 LAB — APTT: aPTT: 30 seconds (ref 24–36)

## 2021-04-21 NOTE — Progress Notes (Signed)
PCP - Henreitta Leber, NP @ [facility] @ [facility] Cardiologist - Dr. Charlton Haws  Chest x-ray - 04/21/21 EKG - 04/21/21 Stress Test - 04/27/17 ECHO - 03/06/21 Cardiac Cath - denies  Sleep Study - Yes. Years ago per patient.  CPAP - Patient states he has a CPAP machine but does not use it   Blood Thinner Instructions: Per note in chart by Margaretmary Dys, RPH-CPP patient was given the following directions.  4/25- Last dose of Xarelto in the evening 4/26- Inject Lovenox 150 mg SQ in the fatty tissue of the abdomen at 8 pm 4/27- No Lovenox, No Xarelto 4/28- No Lovenox. No Xarelto  Patient confirmed that he took his last dose of Xarelto on 4/25 and will administer Lovenox at 8 pm tonight.    COVID TEST- 04/21/21    Anesthesia review: Yes. Abnormal EKG. Contacted Verlee Rossetti, PA by phone to review today's EKG.  Revonda Standard contacted Francis Dowse, PA-C with cardiology. Patient instructed to continue as planned and contact Cardiology for any acute changes or symptoms. Patient verbalized understanding.   Patient denies shortness of breath, fever, cough and chest pain at PAT appointment   All instructions explained to the patient, with a verbal understanding of the material. Patient agrees to go over the instructions while at home for a better understanding. Patient also instructed to self quarantine after being tested for COVID-19. The opportunity to ask questions was provided.

## 2021-04-22 ENCOUNTER — Encounter (HOSPITAL_COMMUNITY): Payer: Self-pay

## 2021-04-22 LAB — SARS CORONAVIRUS 2 (TAT 6-24 HRS): SARS Coronavirus 2: NEGATIVE

## 2021-04-22 NOTE — Anesthesia Preprocedure Evaluation (Addendum)
Anesthesia Evaluation  Patient identified by MRN, date of birth, ID band Patient awake    Reviewed: Allergy & Precautions, NPO status , Patient's Chart, lab work & pertinent test results  History of Anesthesia Complications (+) history of anesthetic complications  Airway Mallampati: III  TM Distance: <3 FB Neck ROM: Full    Dental  (+) Dental Advisory Given, Teeth Intact   Pulmonary    breath sounds clear to auscultation       Cardiovascular hypertension, Pt. on medications (-) angina(-) Past MI and (-) CHF + dysrhythmias Atrial Fibrillation  Rhythm:Irregular  1. Left ventricular ejection fraction, by estimation, is 60 to 65%. The  left ventricle has normal function. The left ventricle has no regional  wall motion abnormalities. There is mild concentric left ventricular  hypertrophy. Left ventricular diastolic  parameters are consistent with Grade I diastolic dysfunction (impaired  relaxation).  2. Right ventricular systolic function is normal. The right ventricular  size is normal. There is normal pulmonary artery systolic pressure. The  estimated right ventricular systolic pressure is 22.2 mmHg.  3. Left atrial size was moderately dilated.  4. The mitral valve is grossly normal. Trivial mitral valve  regurgitation.  5. The aortic valve is tricuspid. Aortic valve regurgitation is not  visualized.  6. The inferior vena cava is normal in size with greater than 50%  respiratory variability, suggesting right atrial pressure of 3 mmHg.     Nuclear stress EF: 55%.  The left ventricular ejection fraction is normal (55-65%).  There was no ST segment deviation noted during stress.  The study is normal.  This is a low risk study.   Normal stress nuclear study with no ischemia or infarction; EF 55 with normal wall motion.    Neuro/Psych Stroke in 2000 CVA negative psych ROS   GI/Hepatic GERD  ,  Endo/Other   negative endocrine ROS  Renal/GU negative Renal ROS     Musculoskeletal  (+) Arthritis ,   Abdominal   Peds  Hematology xarelto for afib  Lab Results      Component                Value               Date                      WBC                      5.9                 04/21/2021                HGB                      13.3                04/21/2021                HCT                      42.4                04/21/2021                MCV                      87.4  04/21/2021                PLT                      200                 04/21/2021              Anesthesia Other Findings   Reproductive/Obstetrics                           Anesthesia Physical Anesthesia Plan  ASA: II  Anesthesia Plan: General   Post-op Pain Management:    Induction: Intravenous  PONV Risk Score and Plan: 2 and Ondansetron and Dexamethasone  Airway Management Planned: Oral ETT and Double Lumen EBT  Additional Equipment: None  Intra-op Plan:   Post-operative Plan: Extubation in OR  Informed Consent: I have reviewed the patients History and Physical, chart, labs and discussed the procedure including the risks, benefits and alternatives for the proposed anesthesia with the patient or authorized representative who has indicated his/her understanding and acceptance.     Dental advisory given  Plan Discussed with: CRNA and Surgeon  Anesthesia Plan Comments: (PAT note written 04/22/2021 by Shonna Chock, PA-C. )       Anesthesia Quick Evaluation

## 2021-04-22 NOTE — Progress Notes (Signed)
Anesthesia Chart Review:  Case: [id number] Date/Time: 04/23/21 0730   Procedures:      XI ROBOTIC ASSISTED PARAESOPHAGEAL HERNIA REPAIR WITH FUNDOPLICATION (N/A Chest)     ESOPHAGOGASTRODUODENOSCOPY (EGD) (N/A )   Anesthesia type: General   Pre-op diagnosis: PEH   Location: [facility] ROOM 10 / [facility] OR   Surgeons: Corliss Skains, MD      DISCUSSION: Patient is a 65 year old male scheduled for the above procedure. Patient with recurrent afib and was be evaluated for pulmonary vein isolation, but pre-procedure CT showed a very large hiatal hernia adjacent to and compression part of the left atrium. The hiatal hernia's close proximity to the left pulmonary veins precluded safe ablation. Dr. Lalla Brothers referred patient to CT surgery for hiatal hernia repair and may consider PVI post-recovery.   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 It sounds like he had an episode of bronchospasm ("throat closed up") after anesthesia in the past. + COVID-19 12/16/20 (s/p MAB infusion  12/22/20).  Per cardiology [place], [place], [facility], CPP): Last Xarelto 04/20/21 PM, Lovenox x1 04/21/21 PM then none on 04/21/21-04/23/21 for 04/23/21 surgery.   As of 02/10/21, EKG at [facility] showed SR, but PAT EKG showed he was back in afib (rate 80's-90's). Patient reported feeling fine and says he is unaware when he goes in and out of afib. I did sent communication to Dr. Lalla Brothers that patient was back in afib and also spoke with Francis Dowse, PA-C with EP prior to patient leaving PAT. Patient instructed to seek input/evaluation if any new symptoms, but otherwise rate controlled afib not felt to be a limiting factor in patient proceeding with surgery.    Preoperative COVID-19 test negative 04/21/21. Anesthesia team to evaluate on the day of surgery. 04/21/21 preoperative CXR is still in process.   VS: BP 118/73    Pulse 82   Temp 37.3 C (Oral)   Resp 20   Ht 5\' 7"  (1.702 m)   Wt 89.5 kg   SpO2 98%   BMI 30.91 kg/m    PROVIDERS: , NP is PCP  Marcell Anger, MD is EP cardiologist. Steffanie Dunn, NP is provider at [facility], MD is primary cardiologist   LABS: Labs reviewed: Acceptable for surgery. Total bili 1.5, previously 1.2 04/11/20 with normal AST/ALT. (all labs ordered are listed, but only abnormal results are displayed)  Labs Reviewed  COMPREHENSIVE METABOLIC PANEL - Abnormal; Notable for the following components:      Result Value   Glucose, Bld 180 (*)    Total Bilirubin 1.5 (*)    All other components within normal limits  BLOOD GAS, ARTERIAL - Abnormal; Notable for the following components:   pO2, Arterial 112 (*)    Allens test (pass/fail) BRACHIAL ARTERY (*)    All other components within normal limits  PROTIME-INR - Abnormal; Notable for the following components:   Prothrombin Time 15.4 (*)    All other components within normal limits  URINALYSIS, ROUTINE W REFLEX MICROSCOPIC - Abnormal; Notable for the following components:   Glucose, UA 150 (*)    Leukocytes,Ua SMALL (*)    All other components within normal limits  SURGICAL PCR SCREEN  CBC  APTT  TYPE AND SCREEN     IMAGES: CXR 04/21/21: In process.   EKG:  EKG 04/21/21: Atrial fibrillation at 94 bpm Right bundle branch  block Left anterior fascicular block ** Bifascicular block ** Atrial fibrillation has replaced Sinus rhythm since 18-Apr-2020 Confirmed by Croitoru, Mihai [phone]) on 04/22/2021 8:12:32 AM  EKG 02/10/21 ([facility]); SR. Incomplete RBBB. LAFB.   CV: CT Cardiac 03/17/21: IMPRESSION: 1.  Moderate LAE normal RA No LAA thrombus 2.  Normal PV anatomy with common ostium RLPV/RMPV 3.  No ASD/PFO 4.  Normal aortic root 3.7 cm 5.  No pericardial effusion 6.  Calcium score 56 which is 41 st percentile for age / sex 91. Very large hiatal hernia that  impinges on the lateral LA wall and is adjacent to the LLPV ostium   Echo 03/06/21: IMPRESSIONS  1. Left ventricular ejection fraction, by estimation, is 60 to 65%. The  left ventricle has normal function. The left ventricle has no regional  wall motion abnormalities. There is mild concentric left ventricular  hypertrophy. Left ventricular diastolic  parameters are consistent with Grade I diastolic dysfunction (impaired  relaxation).  2. Right ventricular systolic function is normal. The right ventricular  size is normal. There is normal pulmonary artery systolic pressure. The  estimated right ventricular systolic pressure is 22.2 mmHg.  3. Left atrial size was moderately dilated.  4. The mitral valve is grossly normal. Trivial mitral valve  regurgitation.  5. The aortic valve is tricuspid. Aortic valve regurgitation is not  visualized.  6. The inferior vena cava is normal in size with greater than 50%  respiratory variability, suggesting right atrial pressure of 3 mmHg.  - Comparison(s): No prior Echocardiogram. Unable to access report or images  from 03/10/2006 or 1998 study.    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  Left Carotid: There was no evidence of thrombus, dissection,  atherosclerotic plaque or stenosis in the cervical carotid system.  Vertebrals: Bilateral vertebral arteries demonstrate antegrade flow.    Nuclear stress test 01/11/19:  Nuclear stress EF: 55%.  The left ventricular ejection fraction is normal (55-65%).  There was no ST segment deviation noted during stress.  The study is normal.  This is a low risk study. Normal stress nuclear study with no ischemia or infarction; EF 55 with normal wall motion.   Past Medical History:  Diagnosis Date  . A-fib (HCC)   . Complication of anesthesia    "waking up, throat closed up"  . CVA (cerebral vascular accident) (HCC)  2000  . GERD (gastroesophageal reflux disease)   . History of kidney stones   . HLD (hyperlipidemia)   . HTN (hypertension)   . Obesity   . OSA (obstructive sleep apnea)   . OSA (obstructive sleep apnea) 05/07/2015  . Right carotid artery occlusion    Chronic RICA occlusion, no stenosis LICA on 01/26/21 carotid Duplex    Past Surgical History:  Procedure Laterality Date  . CARDIOVERSION N/A 06/03/2015   Procedure: CARDIOVERSION;  Surgeon: Wendall Stade, MD;  Location: [facility] ENDOSCOPY;  Service: Cardiovascular;  Laterality: N/A;  . CARDIOVERSION N/A 05/11/2017   Procedure: CARDIOVERSION;  Surgeon: Wendall Stade, MD;  Location: [facility] ENDOSCOPY;  Service: Cardiovascular;  Laterality: N/A;  . CARDIOVERSION N/A 04/11/2020   Procedure: CARDIOVERSION;  Surgeon: Pricilla Riffle, MD;  Location: [facility] ENDOSCOPY;  Service: Cardiovascular;  Laterality: N/A;  . COLONOSCOPY    . TONSILLECTOMY      MEDICATIONS: . enalapril (VASOTEC) 20 MG tablet  . enoxaparin (LOVENOX) 150 MG/ML injection  . esomeprazole (NEXIUM) 20 MG packet  .  flecainide (TAMBOCOR) 100 MG tablet  . rivaroxaban (XARELTO) 20 MG TABS tablet  . tamsulosin (FLOMAX) 0.4 MG CAPS capsule  . Vitamin D, Ergocalciferol, (DRISDOL) 1.25 MG (50000 UNIT) CAPS capsule   No current facility-administered medications for this encounter.    Shonna Chock, PA-C Surgical Short Stay/Anesthesiology [facility] Phone [phone] [facility] Phone [phone] 04/22/2021 10:01 AM

## 2021-04-23 ENCOUNTER — Inpatient Hospital Stay (HOSPITAL_COMMUNITY)
Admission: RE | Admit: 2021-04-23 | Discharge: 2021-04-27 | DRG: 327 | Disposition: A | Discharge status: Home / Self Care | Payer: Medicare Other | Attending: Thoracic Surgery (Cardiothoracic Vascular Surgery) | Admitting: Thoracic Surgery (Cardiothoracic Vascular Surgery)

## 2021-04-23 ENCOUNTER — Encounter (HOSPITAL_COMMUNITY)
Admission: RE | Disposition: A | Discharge status: Home / Self Care | Payer: Self-pay | Source: Home / Self Care | Attending: Thoracic Surgery (Cardiothoracic Vascular Surgery)

## 2021-04-23 ENCOUNTER — Inpatient Hospital Stay (HOSPITAL_COMMUNITY): Payer: Medicare Other

## 2021-04-23 ENCOUNTER — Inpatient Hospital Stay (HOSPITAL_COMMUNITY): Payer: Medicare Other | Admitting: Vascular Surgery

## 2021-04-23 ENCOUNTER — Other Ambulatory Visit: Payer: Self-pay

## 2021-04-23 ENCOUNTER — Encounter (HOSPITAL_COMMUNITY): Payer: Self-pay | Admitting: Thoracic Surgery (Cardiothoracic Vascular Surgery)

## 2021-04-23 DIAGNOSIS — Z8673 Personal history of transient ischemic attack (TIA), and cerebral infarction without residual deficits: Secondary | ICD-10-CM | POA: Diagnosis not present

## 2021-04-23 DIAGNOSIS — I10 Essential (primary) hypertension: Secondary | ICD-10-CM | POA: Diagnosis present

## 2021-04-23 DIAGNOSIS — Z8719 Personal history of other diseases of the digestive system: Secondary | ICD-10-CM | POA: Diagnosis not present

## 2021-04-23 DIAGNOSIS — Z8249 Family history of ischemic heart disease and other diseases of the circulatory system: Secondary | ICD-10-CM

## 2021-04-23 DIAGNOSIS — K219 Gastro-esophageal reflux disease without esophagitis: Secondary | ICD-10-CM | POA: Diagnosis present

## 2021-04-23 DIAGNOSIS — G4733 Obstructive sleep apnea (adult) (pediatric): Secondary | ICD-10-CM | POA: Diagnosis present

## 2021-04-23 DIAGNOSIS — Z79899 Other long term (current) drug therapy: Secondary | ICD-10-CM | POA: Diagnosis not present

## 2021-04-23 DIAGNOSIS — E669 Obesity, unspecified: Secondary | ICD-10-CM | POA: Diagnosis present

## 2021-04-23 DIAGNOSIS — D62 Acute posthemorrhagic anemia: Secondary | ICD-10-CM | POA: Diagnosis not present

## 2021-04-23 DIAGNOSIS — J95811 Postprocedural pneumothorax: Secondary | ICD-10-CM | POA: Diagnosis not present

## 2021-04-23 DIAGNOSIS — K449 Diaphragmatic hernia without obstruction or gangrene: Secondary | ICD-10-CM

## 2021-04-23 DIAGNOSIS — I4819 Other persistent atrial fibrillation: Secondary | ICD-10-CM | POA: Diagnosis present

## 2021-04-23 DIAGNOSIS — Z9889 Other specified postprocedural states: Secondary | ICD-10-CM

## 2021-04-23 DIAGNOSIS — Z09 Encounter for follow-up examination after completed treatment for conditions other than malignant neoplasm: Secondary | ICD-10-CM

## 2021-04-23 DIAGNOSIS — Z7901 Long term (current) use of anticoagulants: Secondary | ICD-10-CM | POA: Diagnosis not present

## 2021-04-23 DIAGNOSIS — I4510 Unspecified right bundle-branch block: Secondary | ICD-10-CM | POA: Diagnosis present

## 2021-04-23 DIAGNOSIS — Z6832 Body mass index (BMI) 32.0-32.9, adult: Secondary | ICD-10-CM

## 2021-04-23 DIAGNOSIS — I480 Paroxysmal atrial fibrillation: Secondary | ICD-10-CM | POA: Diagnosis not present

## 2021-04-23 DIAGNOSIS — Z87442 Personal history of urinary calculi: Secondary | ICD-10-CM

## 2021-04-23 DIAGNOSIS — Z881 Allergy status to other antibiotic agents status: Secondary | ICD-10-CM

## 2021-04-23 DIAGNOSIS — E785 Hyperlipidemia, unspecified: Secondary | ICD-10-CM | POA: Diagnosis present

## 2021-04-23 DIAGNOSIS — Z20822 Contact with and (suspected) exposure to covid-19: Secondary | ICD-10-CM | POA: Diagnosis present

## 2021-04-23 HISTORY — PX: ESOPHAGOGASTRODUODENOSCOPY: SHX5428

## 2021-04-23 HISTORY — PX: XI ROBOTIC ASSISTED PARAESOPHAGEAL HERNIA REPAIR: SHX6871

## 2021-04-23 LAB — ABO/RH: ABO/RH(D): A POS

## 2021-04-23 SURGERY — REPAIR, HERNIA, PARAESOPHAGEAL, ROBOT-ASSISTED
Anesthesia: General | Site: Chest

## 2021-04-23 MED ORDER — LIDOCAINE 2% (20 MG/ML) 5 ML SYRINGE
INTRAMUSCULAR | Status: AC
  Filled 2021-04-23: qty 5

## 2021-04-23 MED ORDER — ONDANSETRON HCL 4 MG/2ML IJ SOLN
INTRAMUSCULAR | Status: DC | PRN
  Administered 2021-04-23: 4 mg via INTRAVENOUS

## 2021-04-23 MED ORDER — ACETAMINOPHEN 10 MG/ML IV SOLN
1000.0000 mg | Freq: Once | INTRAVENOUS | Status: DC | PRN
  Administered 2021-04-23: 1000 mg via INTRAVENOUS

## 2021-04-23 MED ORDER — DEXAMETHASONE SODIUM PHOSPHATE 10 MG/ML IJ SOLN
INTRAMUSCULAR | Status: AC
  Filled 2021-04-23: qty 1

## 2021-04-23 MED ORDER — BUPIVACAINE LIPOSOME 1.3 % IJ SUSP
INTRAMUSCULAR | Status: DC | PRN
  Administered 2021-04-23: 20 mL

## 2021-04-23 MED ORDER — AMIODARONE HCL IN DEXTROSE 360-4.14 MG/200ML-% IV SOLN: 30.0000 mg/h | INTRAVENOUS | Status: DC

## 2021-04-23 MED ORDER — MORPHINE SULFATE (PF) 2 MG/ML IV SOLN
2.0000 mg | INTRAVENOUS | Status: DC | PRN
  Administered 2021-04-23 – 2021-04-24 (×7): 2 mg via INTRAVENOUS
  Filled 2021-04-23 (×8): qty 1

## 2021-04-23 MED ORDER — FENTANYL CITRATE (PF) 100 MCG/2ML IJ SOLN
INTRAMUSCULAR | Status: AC
  Filled 2021-04-23: qty 2

## 2021-04-23 MED ORDER — CHLORHEXIDINE GLUCONATE 0.12 % MT SOLN
OROMUCOSAL | Status: AC
  Administered 2021-04-23: 15 mL via OROMUCOSAL
  Filled 2021-04-23: qty 15

## 2021-04-23 MED ORDER — ORAL CARE MOUTH RINSE: 15.0000 mL | Freq: Once | OROMUCOSAL | Status: AC

## 2021-04-23 MED ORDER — METOPROLOL TARTRATE 5 MG/5ML IV SOLN: 5.0000 mg | Freq: Four times a day (QID) | INTRAVENOUS | Status: DC

## 2021-04-23 MED ORDER — ACETAMINOPHEN 10 MG/ML IV SOLN
INTRAVENOUS | Status: AC
  Filled 2021-04-23: qty 100

## 2021-04-23 MED ORDER — FENTANYL CITRATE (PF) 100 MCG/2ML IJ SOLN
25.0000 ug | INTRAMUSCULAR | Status: DC | PRN
  Administered 2021-04-23 (×3): 50 ug via INTRAVENOUS

## 2021-04-23 MED ORDER — FENTANYL CITRATE (PF) 250 MCG/5ML IJ SOLN
INTRAMUSCULAR | Status: AC
  Filled 2021-04-23: qty 5

## 2021-04-23 MED ORDER — ALBUMIN HUMAN 5 % IV SOLN: INTRAVENOUS | Status: DC | PRN

## 2021-04-23 MED ORDER — PROPOFOL 10 MG/ML IV BOLUS
INTRAVENOUS | Status: AC
  Filled 2021-04-23: qty 20

## 2021-04-23 MED ORDER — LACTATED RINGERS IV SOLN: INTRAVENOUS | Status: DC | PRN

## 2021-04-23 MED ORDER — SUGAMMADEX SODIUM 200 MG/2ML IV SOLN
INTRAVENOUS | Status: DC | PRN
  Administered 2021-04-23: 200 mg via INTRAVENOUS

## 2021-04-23 MED ORDER — MIDAZOLAM HCL 2 MG/2ML IJ SOLN
INTRAMUSCULAR | Status: AC
  Filled 2021-04-23: qty 2

## 2021-04-23 MED ORDER — PROPOFOL 10 MG/ML IV BOLUS
INTRAVENOUS | Status: DC | PRN
  Administered 2021-04-23: 120 mg via INTRAVENOUS

## 2021-04-23 MED ORDER — ROCURONIUM BROMIDE 10 MG/ML (PF) SYRINGE
PREFILLED_SYRINGE | INTRAVENOUS | Status: AC
  Filled 2021-04-23: qty 10

## 2021-04-23 MED ORDER — CHLORHEXIDINE GLUCONATE 0.12 % MT SOLN: 15.0000 mL | Freq: Once | OROMUCOSAL | Status: AC

## 2021-04-23 MED ORDER — HYDROMORPHONE HCL 1 MG/ML IJ SOLN
INTRAMUSCULAR | Status: AC
  Filled 2021-04-23: qty 1

## 2021-04-23 MED ORDER — ACETAMINOPHEN 500 MG PO TABS: 1000.0000 mg | ORAL_TABLET | Freq: Once | ORAL | Status: DC | PRN

## 2021-04-23 MED ORDER — OXYCODONE HCL 5 MG PO TABS: 5.0000 mg | ORAL_TABLET | Freq: Once | ORAL | Status: DC | PRN

## 2021-04-23 MED ORDER — KETOROLAC TROMETHAMINE 30 MG/ML IJ SOLN
30.0000 mg | Freq: Once | INTRAMUSCULAR | Status: AC
  Administered 2021-04-23: 30 mg via INTRAVENOUS

## 2021-04-23 MED ORDER — CEFAZOLIN SODIUM-DEXTROSE 2-4 GM/100ML-% IV SOLN
2.0000 g | INTRAVENOUS | Status: AC
  Administered 2021-04-23: 2 g via INTRAVENOUS
  Filled 2021-04-23: qty 100

## 2021-04-23 MED ORDER — ACETAMINOPHEN 500 MG PO TABS
1000.0000 mg | ORAL_TABLET | Freq: Four times a day (QID) | ORAL | Status: DC
  Administered 2021-04-24 – 2021-04-27 (×7): 1000 mg via ORAL
  Filled 2021-04-23 (×8): qty 2

## 2021-04-23 MED ORDER — AMIODARONE HCL IN DEXTROSE 360-4.14 MG/200ML-% IV SOLN: 60.0000 mg/h | INTRAVENOUS | Status: DC

## 2021-04-23 MED ORDER — LABETALOL HCL 5 MG/ML IV SOLN
5.0000 mg | INTRAVENOUS | Status: DC | PRN
  Administered 2021-04-23: 5 mg via INTRAVENOUS

## 2021-04-23 MED ORDER — ONDANSETRON HCL 4 MG/2ML IJ SOLN
INTRAMUSCULAR | Status: AC
  Filled 2021-04-23: qty 2

## 2021-04-23 MED ORDER — BUPIVACAINE LIPOSOME 1.3 % IJ SUSP
INTRAMUSCULAR | Status: AC
  Filled 2021-04-23: qty 20

## 2021-04-23 MED ORDER — PHENYLEPHRINE 40 MCG/ML (10ML) SYRINGE FOR IV PUSH (FOR BLOOD PRESSURE SUPPORT)
PREFILLED_SYRINGE | INTRAVENOUS | Status: DC | PRN
  Administered 2021-04-23 (×2): 80 ug via INTRAVENOUS
  Administered 2021-04-23: 160 ug via INTRAVENOUS
  Administered 2021-04-23: 80 ug via INTRAVENOUS
  Administered 2021-04-23: 120 ug via INTRAVENOUS
  Administered 2021-04-23 (×2): 80 ug via INTRAVENOUS
  Administered 2021-04-23 (×2): 120 ug via INTRAVENOUS

## 2021-04-23 MED ORDER — HYDROMORPHONE HCL 1 MG/ML IJ SOLN
0.2500 mg | INTRAMUSCULAR | Status: DC | PRN
  Administered 2021-04-23: 0.5 mg via INTRAVENOUS

## 2021-04-23 MED ORDER — MIDAZOLAM HCL 5 MG/5ML IJ SOLN
INTRAMUSCULAR | Status: DC | PRN
  Administered 2021-04-23: 2 mg via INTRAVENOUS

## 2021-04-23 MED ORDER — DILTIAZEM HCL-DEXTROSE 125-5 MG/125ML-% IV SOLN (PREMIX)
5.0000 mg/h | INTRAVENOUS | Status: DC
  Filled 2021-04-23: qty 125

## 2021-04-23 MED ORDER — ONDANSETRON 4 MG PO TBDP: 4.0000 mg | ORAL_TABLET | Freq: Four times a day (QID) | ORAL | Status: DC | PRN

## 2021-04-23 MED ORDER — KETOROLAC TROMETHAMINE 15 MG/ML IJ SOLN
15.0000 mg | Freq: Four times a day (QID) | INTRAMUSCULAR | Status: DC
  Administered 2021-04-23 – 2021-04-24 (×3): 15 mg via INTRAVENOUS
  Filled 2021-04-23 (×3): qty 1

## 2021-04-23 MED ORDER — DILTIAZEM HCL-DEXTROSE 125-5 MG/125ML-% IV SOLN (PREMIX)
5.0000 mg/h | INTRAVENOUS | Status: DC
Start: 2021-04-23 — End: 2021-04-24
  Administered 2021-04-23: 5 mg/h via INTRAVENOUS
  Administered 2021-04-24: 10 mg/h via INTRAVENOUS
  Filled 2021-04-23 (×3): qty 125

## 2021-04-23 MED ORDER — DEXMEDETOMIDINE (PRECEDEX) IN NS 20 MCG/5ML (4 MCG/ML) IV SYRINGE
PREFILLED_SYRINGE | INTRAVENOUS | Status: DC | PRN
  Administered 2021-04-23 (×2): 8 ug via INTRAVENOUS

## 2021-04-23 MED ORDER — OXYCODONE HCL 5 MG/5ML PO SOLN: 5.0000 mg | Freq: Once | ORAL | Status: DC | PRN

## 2021-04-23 MED ORDER — ONDANSETRON HCL 4 MG/2ML IJ SOLN: 4.0000 mg | Freq: Four times a day (QID) | INTRAMUSCULAR | Status: DC | PRN

## 2021-04-23 MED ORDER — ENOXAPARIN SODIUM 40 MG/0.4ML IJ SOSY
40.0000 mg | PREFILLED_SYRINGE | INTRAMUSCULAR | Status: DC
  Administered 2021-04-24: 40 mg via SUBCUTANEOUS
  Filled 2021-04-23: qty 0.4

## 2021-04-23 MED ORDER — ACETAMINOPHEN 160 MG/5ML PO SOLN: 1000.0000 mg | Freq: Once | ORAL | Status: DC | PRN

## 2021-04-23 MED ORDER — DEXAMETHASONE SODIUM PHOSPHATE 10 MG/ML IJ SOLN
INTRAMUSCULAR | Status: DC | PRN
  Administered 2021-04-23: 5 mg via INTRAVENOUS

## 2021-04-23 MED ORDER — LIDOCAINE 2% (20 MG/ML) 5 ML SYRINGE
INTRAMUSCULAR | Status: DC | PRN
  Administered 2021-04-23: 60 mg via INTRAVENOUS

## 2021-04-23 MED ORDER — HEMOSTATIC AGENTS (NO CHARGE) OPTIME
TOPICAL | Status: DC | PRN
  Administered 2021-04-23: 1 via TOPICAL

## 2021-04-23 MED ORDER — BUPIVACAINE HCL (PF) 0.5 % IJ SOLN
INTRAMUSCULAR | Status: AC
  Filled 2021-04-23: qty 30

## 2021-04-23 MED ORDER — LABETALOL HCL 5 MG/ML IV SOLN
INTRAVENOUS | Status: DC | PRN
  Administered 2021-04-23: 5 mg via INTRAVENOUS

## 2021-04-23 MED ORDER — CEFAZOLIN SODIUM-DEXTROSE 2-4 GM/100ML-% IV SOLN
2.0000 g | Freq: Three times a day (TID) | INTRAVENOUS | Status: AC
  Administered 2021-04-23: 2 g via INTRAVENOUS
  Filled 2021-04-23: qty 100

## 2021-04-23 MED ORDER — ROCURONIUM BROMIDE 10 MG/ML (PF) SYRINGE
PREFILLED_SYRINGE | INTRAVENOUS | Status: DC | PRN
  Administered 2021-04-23: 30 mg via INTRAVENOUS
  Administered 2021-04-23: 20 mg via INTRAVENOUS
  Administered 2021-04-23: 70 mg via INTRAVENOUS
  Administered 2021-04-23: 10 mg via INTRAVENOUS

## 2021-04-23 MED ORDER — LACTATED RINGERS IV SOLN: INTRAVENOUS | Status: DC

## 2021-04-23 MED ORDER — KETOROLAC TROMETHAMINE 30 MG/ML IJ SOLN
INTRAMUSCULAR | Status: AC
  Filled 2021-04-23: qty 1

## 2021-04-23 MED ORDER — BUPIVACAINE HCL 0.5 % IJ SOLN
INTRAMUSCULAR | Status: DC | PRN
  Administered 2021-04-23: 30 mL

## 2021-04-23 MED ORDER — FENTANYL CITRATE (PF) 250 MCG/5ML IJ SOLN
INTRAMUSCULAR | Status: DC | PRN
  Administered 2021-04-23 (×4): 50 ug via INTRAVENOUS
  Administered 2021-04-23: 100 ug via INTRAVENOUS
  Administered 2021-04-23: 50 ug via INTRAVENOUS

## 2021-04-23 MED ORDER — PHENYLEPHRINE HCL-NACL 10-0.9 MG/250ML-% IV SOLN
INTRAVENOUS | Status: DC | PRN
  Administered 2021-04-23: 40 ug/min via INTRAVENOUS

## 2021-04-23 MED ORDER — 0.9 % SODIUM CHLORIDE (POUR BTL) OPTIME
TOPICAL | Status: DC | PRN
  Administered 2021-04-23: 2000 mL

## 2021-04-23 SURGICAL SUPPLY — 88 items
ADH SKN CLS APL DERMABOND .7 (GAUZE/BANDAGES/DRESSINGS) ×2
BAG SPEC RTRVL C125 8X14 (MISCELLANEOUS) ×2
BLADE CLIPPER SURG (BLADE) ×3 IMPLANT
BLADE SURG 11 STRL SS (BLADE) ×3 IMPLANT
BUTTON OLYMPUS DEFENDO 5 PIECE (MISCELLANEOUS) ×3 IMPLANT
CANISTER SUCT 3000ML PPV (MISCELLANEOUS) ×6 IMPLANT
CANNULA REDUC XI 12-8 STAPL (CANNULA)
CANNULA REDUCER 12-8 DVNC XI (CANNULA) IMPLANT
CATH THORACIC 28FR (CATHETERS) IMPLANT
CNTNR URN SCR LID CUP LEK RST (MISCELLANEOUS) ×2 IMPLANT
CONT SPEC 4OZ STRL OR WHT (MISCELLANEOUS)
COVER BACK TABLE 60X90IN (DRAPES) ×2 IMPLANT
DECANTER SPIKE VIAL GLASS SM (MISCELLANEOUS) ×2 IMPLANT
DEFOGGER SCOPE WARMER CLEARIFY (MISCELLANEOUS) ×3 IMPLANT
DERMABOND ADVANCED (GAUZE/BANDAGES/DRESSINGS) ×1
DERMABOND ADVANCED .7 DNX12 (GAUZE/BANDAGES/DRESSINGS) ×4 IMPLANT
DEVICE SUTURE ENDOST 10MM (ENDOMECHANICALS) IMPLANT
DRAIN PENROSE 1/2X12 LTX STRL (WOUND CARE) ×2 IMPLANT
DRAPE COLUMN DVNC XI (DISPOSABLE) ×8 IMPLANT
DRAPE CV SPLIT W-CLR ANES SCRN (DRAPES) ×2 IMPLANT
DRAPE DA VINCI XI COLUMN (DISPOSABLE) ×12
DRAPE INCISE IOBAN 66X45 STRL (DRAPES) IMPLANT
DRAPE ORTHO SPLIT 77X108 STRL (DRAPES)
DRAPE SURG ORHT 6 SPLT 77X108 (DRAPES) ×2 IMPLANT
ELECT REM PT RETURN 9FT ADLT (ELECTROSURGICAL) ×3
ELECTRODE REM PT RTRN 9FT ADLT (ELECTROSURGICAL) ×2 IMPLANT
FELT TEFLON 1X6 (MISCELLANEOUS) IMPLANT
FORCEPS GRASP COMBO 8X230 (FORCEP) ×2 IMPLANT
GAUZE SPONGE 4X4 12PLY STRL (GAUZE/BANDAGES/DRESSINGS) ×2 IMPLANT
GLOVE BIO SURGEON STRL SZ7 (GLOVE) ×2 IMPLANT
GLOVE BIO SURGEON STRL SZ7.5 (GLOVE) ×7 IMPLANT
GOWN STRL REUS W/ TWL LRG LVL3 (GOWN DISPOSABLE) ×2 IMPLANT
GOWN STRL REUS W/ TWL XL LVL3 (GOWN DISPOSABLE) ×4 IMPLANT
GOWN STRL REUS W/TWL 2XL LVL3 (GOWN DISPOSABLE) ×3 IMPLANT
GOWN STRL REUS W/TWL LRG LVL3 (GOWN DISPOSABLE) ×15
GOWN STRL REUS W/TWL XL LVL3 (GOWN DISPOSABLE) ×6
GRASPER SUT TROCAR 14GX15 (MISCELLANEOUS) IMPLANT
HEMOSTAT SURGICEL 2X14 (HEMOSTASIS) ×1 IMPLANT
IV NS 1000ML (IV SOLUTION)
IV NS 1000ML BAXH (IV SOLUTION) IMPLANT
KIT BASIN OR (CUSTOM PROCEDURE TRAY) ×3 IMPLANT
KIT TURNOVER KIT B (KITS) ×3 IMPLANT
MARKER SKIN DUAL TIP RULER LAB (MISCELLANEOUS) ×3 IMPLANT
MATRIX GENTRIX 7.5X6 HIATAL (Tissue) ×1 IMPLANT
NDL 18GX1X1/2 (RX/OR ONLY) (NEEDLE) IMPLANT
NDL SCLEROTHERAPY 23X2X240 (NEEDLE) IMPLANT
NEEDLE 18GX1X1/2 (RX/OR ONLY) (NEEDLE) IMPLANT
NEEDLE HYPO 22GX1.5 SAFETY (NEEDLE) ×3 IMPLANT
NEEDLE SCLEROTHERAPY 23X2X240 (NEEDLE) IMPLANT
NS IRRIG 1000ML POUR BTL (IV SOLUTION) ×6 IMPLANT
OIL SILICONE PENTAX (PARTS (SERVICE/REPAIRS)) IMPLANT
PACK CHEST (CUSTOM PROCEDURE TRAY) ×3 IMPLANT
PACK UNIVERSAL I (CUSTOM PROCEDURE TRAY) ×3 IMPLANT
PAD ARMBOARD 7.5X6 YLW CONV (MISCELLANEOUS) ×6 IMPLANT
PORT ACCESS TROCAR AIRSEAL 12 (TROCAR) ×2 IMPLANT
PORT ACCESS TROCAR AIRSEAL 5M (TROCAR) ×2
SEAL CANN UNIV 5-8 DVNC XI (MISCELLANEOUS) ×8 IMPLANT
SEAL XI 5MM-8MM UNIVERSAL (MISCELLANEOUS) ×12
SEALER SYNCHRO 8 IS4000 DV (MISCELLANEOUS) ×3
SEALER SYNCHRO 8 IS4000 DVNC (MISCELLANEOUS) IMPLANT
SET TRI-LUMEN FLTR TB AIRSEAL (TUBING) ×3 IMPLANT
SHEET MEDIUM DRAPE 40X70 STRL (DRAPES) ×2 IMPLANT
STAPLER CANNULA SEAL DVNC XI (STAPLE) IMPLANT
STAPLER CANNULA SEAL XI (STAPLE)
SUT ETHIBOND 0 36 GRN (SUTURE) ×8 IMPLANT
SUT SILK  1 MH (SUTURE) ×3
SUT SILK 1 MH (SUTURE) ×2 IMPLANT
SUT SURGIDAC NAB ES-9 0 48 120 (SUTURE) IMPLANT
SUT VIC AB 3-0 SH 27 (SUTURE) ×6
SUT VIC AB 3-0 SH 27X BRD (SUTURE) ×4 IMPLANT
SUT VIC AB 3-0 X1 27 (SUTURE) ×6 IMPLANT
SUT VICRYL 0 TIES 12 18 (SUTURE) ×1 IMPLANT
SUT VICRYL 0 UR6 27IN ABS (SUTURE) ×6 IMPLANT
SYR 10ML LL (SYRINGE) IMPLANT
SYR 20CC LL (SYRINGE) ×4 IMPLANT
SYR 20ML ECCENTRIC (SYRINGE) ×3 IMPLANT
SYR 30ML SLIP (SYRINGE) ×3 IMPLANT
SYSTEM RETRIEVAL ANCHOR 8 (MISCELLANEOUS) ×1 IMPLANT
SYSTEM SAHARA CHEST DRAIN ATS (WOUND CARE) IMPLANT
TOWEL GREEN STERILE (TOWEL DISPOSABLE) ×3 IMPLANT
TOWEL GREEN STERILE FF (TOWEL DISPOSABLE) ×3 IMPLANT
TRAY FOLEY MTR SLVR 16FR STAT (SET/KITS/TRAYS/PACK) ×3 IMPLANT
TROCAR XCEL BLADELESS 5X75MML (TROCAR) IMPLANT
TROCAR XCEL NON-BLD 5MMX100MML (ENDOMECHANICALS) IMPLANT
TUBE CONNECTING 20X1/4 (TUBING) ×3 IMPLANT
TUBING ENDO SMARTCAP (MISCELLANEOUS) ×3 IMPLANT
UNDERPAD 30X36 HEAVY ABSORB (UNDERPADS AND DIAPERS) ×3 IMPLANT
WATER STERILE IRR 1000ML POUR (IV SOLUTION) ×3 IMPLANT

## 2021-04-23 NOTE — Plan of Care (Signed)

## 2021-04-23 NOTE — Plan of Care (Signed)

## 2021-04-23 NOTE — Interval H&P Note (Signed)
History and Physical Interval Note:  04/23/2021 7:34 AM  Tanner Rich  has presented today for surgery, with the diagnosis of PEH.  The various methods of treatment have been discussed with the patient and family. After consideration of risks, benefits and other options for treatment, the patient has consented to  Procedure(s): XI ROBOTIC ASSISTED PARAESOPHAGEAL HERNIA REPAIR WITH FUNDOPLICATION (N/A) ESOPHAGOGASTRODUODENOSCOPY (EGD) (N/A) as a surgical intervention.  The patient's history has been reviewed, patient examined, no change in status, stable for surgery.  I have reviewed the patient's chart and labs.  Questions were answered to the patient's satisfaction.     Sofiah Lyne Keane Scrape

## 2021-04-23 NOTE — Transfer of Care (Signed)
Immediate Anesthesia Transfer of Care Note  Patient: ZUBAYR BEDNARCZYK  Procedure(s) Performed: XI ROBOTIC ASSISTED PARAESOPHAGEAL HERNIA REPAIR WITH FUNDOPLICATION USING ACELL GENTRIX SURGICAL MATRIX (N/A Chest) ESOPHAGOGASTRODUODENOSCOPY (EGD) (N/A )  Patient Location: PACU  Anesthesia Type:General  Level of Consciousness: awake, alert  and oriented  Airway & Oxygen Therapy: Patient Spontanous Breathing  Post-op Assessment: Report given to RN, Post -op Vital signs reviewed and stable and Patient moving all extremities  Post vital signs: Reviewed and stable  Last Vitals:  Vitals Value Taken Time  BP 160/114 04/23/21 1146  Temp 36.7 C 04/23/21 1146  Pulse 116 04/23/21 1154  Resp 19 04/23/21 1154  SpO2 91 % 04/23/21 1154  Vitals shown include unvalidated device data.  Last Pain:  Vitals:   04/23/21 0609  TempSrc:   PainSc: 0-No pain         Complications: No complications documented.

## 2021-04-23 NOTE — Anesthesia Procedure Notes (Signed)
Procedure Name: Intubation Date/Time: 04/23/2021 8:03 AM Performed by: Colon Flattery, CRNA Pre-anesthesia Checklist: Patient identified, Emergency Drugs available, Suction available and Patient being monitored Patient Re-evaluated:Patient Re-evaluated prior to induction Oxygen Delivery Method: Circle system utilized Preoxygenation: Pre-oxygenation with 100% oxygen Induction Type: IV induction Ventilation: Mask ventilation without difficulty Laryngoscope Size: Glidescope and 4 Grade View: Grade I Tube type: Oral Number of attempts: 1 Airway Equipment and Method: Stylet and Video-laryngoscopy Placement Confirmation: ETT inserted through vocal cords under direct vision,  positive ETCO2 and breath sounds checked- equal and bilateral Secured at: 22 cm Tube secured with: Tape Dental Injury: Teeth and Oropharynx as per pre-operative assessment

## 2021-04-23 NOTE — Op Note (Signed)
[address]       [place] [address]             [phone]        04/23/2021  Patient:  Tanner Rich Pre-Op Dx: Paraesophageal hernia   Chronic atrial fibrillation     Gastroesophageal reflux disease    Post-op Dx: Same Procedure: - Esophagoscopy - Robotic assisted laparoscopy - Paraesophageal hernia repair with ACell mesh buttress -Toupet fundoplication  Surgeon and Role:      * Kiah Keay, Eliezer Lofts, MD - Primary    Webb Laws, PA-C- assisting  Anesthesia  general EBL: Minimal  Blood Administration: None Specimen: Hernia sac   Counts: correct   Indications: 65 year old male with chronic atrial fibrillation who is scheduled to undergo pulmonary vein isolation.  He also has a fairly sizable hiatal hernia with intrathoracic stomach extending up to the level of the left atrium.  This has been present since his abdominal CT in 2018, and is redemonstrated on his coronary CT.  He also has significant reflux and is chronically on Nexium.  We discussed the risks and benefits of a robotic assisted paraesophageal hernia repair with fundoplication.  He is agreeable to proceed.  He will also undergo an upper endoscopy intraoperatively.  Findings: Normal-appearing esophagus on endoscopy.  Large hiatal hernia with omentum and stomach within the hernia sac.  3 cm of intra-abdominal esophagus after completion of the mobilization.  5 stitches were used to reapproximate the hiatus.  ACell mesh used to buttress the hiatal repair.  Operative Technique: After the risks, benefits and alternatives were thoroughly discussed, the patient was brought to the operative theatre.  Anesthesia was induced, and the esophagoscope was passed through the oropharynx down to the stomach.  The scope was retroflexed and the hiatal hernia was clearly evident.  The scope was pulled back and the mucosal surface of the esophagus was visualized.    The scope was then parked at 25 cm from the  incisors.  The patient was then prepped and draped in normal sterile fashion.  An appropriate surgical pause was performed, and pre-operative antibiotics were dosed accordingly.  We began with a 1 cm incision 15 cm caudad from the xiphoid and slightly lateral to the umbilicus.  Using an Optiview we entered the peritoneal space.  The abdomen was then insufflated with CO2.  3 other robotic ports were placed to triangulate the hiatus.  Another 12 mm port was placed in place at the level of the umbilicus laterally for an assistant port and another 5 mm trocar was placed in the right lower quadrant for liver retractor.  The patient was then placed in steep reverse Trendelenburg and the liver was elevated to expose the esophageal hiatus.  And then the robot was docked.  We began by dividing the gastrohepatic ligament to expose the right diaphragmatic crus and then dissected the hernia sac in a clockwise fashion to mobilize there the stomach and esophagus.  We then divided the short gastrics and moved towards the right crus and completed our dissection along the esophageal hiatus.  A Penrose drain was then used to encircle the the esophagus and we continued our dissection up into the mediastinum.  Once we had achieved 3 to 4 cm of intra-abdominal esophagus we then proceeded to reapproximate the crura with 0 Ethibond sutures in an interrupted fashion.  ACell mesh was used to buttress the hiatal repair..  The gastroscope was passed down through the lower  esophageal sphincter into the stomach and would act as our bougie during this repair.  Next the stomach was passed posterior to the esophagus and Toupet fundoplication was performed.  An air leak test was performed using the gastroscope.  No leak was evident.  The liver retractor was removed and all ports were removed under direct visualization.  The skin and soft tissue were closed with absorbable suture    The patient tolerated the procedure without any immediate  complications, and was transferred to the PACU in stable condition.  Shatera Rennert Keane Scrape

## 2021-04-23 NOTE — Brief Op Note (Signed)
04/23/2021  11:23 AM  PATIENT:  Tanner Rich  65 y.o. male  PRE-OPERATIVE DIAGNOSIS:  PARAESOPHAGEAL HERNIA  POST-OPERATIVE DIAGNOSIS:  PARAESOPHAGEAL HERNIA  PROCEDURE:  Procedure(s): XI ROBOTIC ASSISTED PARAESOPHAGEAL HERNIA REPAIR WITH FUNDOPLICATION USING ACELL GENTRIX SURGICAL MATRIX (N/A) ESOPHAGOGASTRODUODENOSCOPY (EGD) (N/A)  SURGEON:  Surgeon(s) and Role:    * Lightfoot, Eliezer Lofts, MD - Primary  PHYSICIAN ASSISTANT: Jarmal Lewelling PA-C  ANESTHESIA:   regional  EBL:  50 CC   BLOOD ADMINISTERED:none  DRAINS: none   LOCAL MEDICATIONS USED:   EXPAREL  SPECIMEN:  Source of Specimen:  HERNIA SAC  DISPOSITION OF SPECIMEN:  PATHOLOGY  COUNTS:  YES  TOURNIQUET:  * No tourniquets in log *  DICTATION: .Dragon Dictation  PLAN OF CARE: Admit to inpatient   PATIENT DISPOSITION:  PACU - hemodynamically stable.   Delay start of Pharmacological VTE agent (>24hrs) due to surgical blood loss or risk of bleeding: yes  COMPLICATIONS: NO KNOWN

## 2021-04-23 NOTE — Discharge Summary (Signed)
Physician Discharge Summary  Patient ID: Tanner Rich MRN: [id number] DOB/AGE: [birth year] 65 y.o.  Admit date: 04/23/2021 Discharge date: 04/27/2021  Admission Diagnoses:Hiatal hernia  Discharge Diagnoses:  Active Problems:   S/P repair of paraesophageal hernia  Patient Active Problem List   Diagnosis Date Noted  . S/P repair of paraesophageal hernia 04/23/2021  . Prostatitis 03/27/2021  . COVID-19 12/17/2020  . HLD (hyperlipidemia) 08/04/2019  . Family history of malignant neoplasm of prostate 06/22/2019  . Hemorrhoids 06/22/2019  . Cerebrovascular disease 10/12/2018  . Gouty arthritis 10/12/2018  . Pre-diabetes 10/12/2018  . Paroxysmal atrial fibrillation (HCC)   . OSA (obstructive sleep apnea) 05/07/2015  . Barrett's esophagus without dysplasia 11/27/2014  . Essential hypertension 09/26/2014  . Benign prostate hyperplasia 12/04/2013  . Lower urinary tract symptoms 12/04/2013  . Urinary tract obstruction 12/04/2013  . Blood in urine 11/14/2013   Discharged Condition: stable  Hospital Course:   The patient was admitted electively and taken to the operating room on 04/23/2021 at which time he underwent robotic assisted repair of paraesophageal hernia.  The patient tolerated the procedure well and was taken to the postanesthesia care unit in stable condition.  Postoperative hospital course:  The patient was noted to have a significant history of atrial fibrillation, therefore cardiology consultation was obtained to assist with this during the postoperative period.  Due to his initial n.p.o. status he was started on intravenous Cardizem but this has subsequently been converted over time to p.o.  He is also started on Xarelto.  He has a CHA2DS2-VASc of 4.   A swallow study was done on postoperative day #1 which showed:  IMPRESSION: No hiatal hernia. Narrowing at the gastroesophageal junction with some holdup in passage of contrast likely reflecting fundoplication and  postoperative edema. No evidence of leak.  He was initially started on a dysphagia diet but on first meal developed significant epigastric discomfort and chest pain so he was subsequently changed to a liquid diet which he tolerated.  A repeat trial of the dysphagia diet was done on postoperative day #2 and he is tolerating this much better.  He is noted to have a significant postoperative blood loss anemia which is being monitored clinically.  Most recent hemoglobin hematocrit dated 04/26/2021 are 7.1 and 22.9 respectively.  Patient has a fib with a not well controlled rate. As of 05/02, his Cardizem CD has been titrated to 360 mg daily and he has been on Rivaroxaban 20 mg at night. EP has been following him and because rate is now better controlled, no need for TEE/DCCV on this admission. Follow up has been arranged in the a fib clinic. Also, patient was instructed NOT to resume Flecainide (was previously stopped with RBBB and LAFB).. As discussed with EP and Dr. Cliffton Asters, patient is surgically stable for discharge today.  Consults: EP  Significant Diagnostic Studies:  CLINICAL DATA:  Postop evaluation.  EXAM: PORTABLE CHEST 1 VIEW  COMPARISON:  April 24, 2021  FINDINGS: The right apical pneumothorax measures 3.6 cm today versus 4.5 cm previously, slightly improved. No left-sided pneumothorax identified. Minimal atelectasis in the bases. The cardiomediastinal silhouette is stable. Continued pneumomediastinum identified. No other changes.  IMPRESSION: 1. Mild improvement of the right apical pneumothorax measuring 3.6 cm today versus 4.5 cm previously. 2. Continued pneumomediastinum. 3. No other abnormalities.   Electronically Signed   By: Gerome Sam III M.D   On: 04/25/2021 08:59  Treatments:  Esophagoscopy - Robotic assisted laparoscopy - Paraesophageal hernia repair with ACell  mesh buttress -Toupet fundoplication by Dr. Cliffton Asters on 04/23/2021.  Discharge  Exam: Blood pressure 133/71, pulse (!) 101, temperature 98.2 F (36.8 C), temperature source Oral, resp. rate 18, height 5\' 7"  (1.702 m), weight 89.8 kg, SpO2 94 %.  Cardiovascular: Pulmonary: Clear to auscultation bilaterally Abdomen: Soft, non tender, bowel sounds present. Extremities: No lower extremity edema. Wounds: Clean and dry.  No erythema or signs of infection.   Disposition: Discharge disposition: 01-Home or Self Care    Stable and discharged to home.   Allergies as of 04/27/2021      Reactions   Ciprofloxacin Swelling   Pt states he took med for 3 days and ankle swelling started.      Medication List    STOP taking these medications   enalapril 20 MG tablet Commonly known as: VASOTEC   enoxaparin 150 MG/ML injection Commonly known as: Lovenox   flecainide 100 MG tablet Commonly known as: TAMBOCOR     TAKE these medications   acetaminophen 500 MG tablet Commonly known as: TYLENOL Take 2 tablets (1,000 mg total) by mouth every 6 (six) hours.   diltiazem 360 MG 24 hr capsule Commonly known as: CARDIZEM CD Take 1 capsule (360 mg total) by mouth daily.   esomeprazole 20 MG packet Commonly known as: NEXIUM Take 20 mg by mouth daily before breakfast.   rivaroxaban 20 MG Tabs tablet Commonly known as: XARELTO Take 20 mg by mouth daily.   tamsulosin 0.4 MG Caps capsule Commonly known as: FLOMAX Take 0.4 mg by mouth daily.   traMADol 50 MG tablet Commonly known as: Ultram Take 1 tablet (50 mg total) by mouth every 6 (six) hours as needed for severe pain.   Vitamin D (Ergocalciferol) 1.25 MG (50000 UNIT) Caps capsule Commonly known as: DRISDOL Take 50,000 Units by mouth every 30 (thirty) days.       Follow-up Information    April 22, MD Follow up on 05/08/2021.   Specialty: Cardiothoracic Surgery Why: This will be a virtual visit.  The doctor will contact you at 3:50 via telephone for your appointment Contact  information: [address] [place] [phone]        [facility] Follow up.   Specialty: Cardiology Why: on 5/12 at 1130 am for post hospital follow up and to discuss tikosyn.  Contact information: [address] [place] [phone]              Signed: [phone] 04/27/2021, 12:12 PM

## 2021-04-23 NOTE — Hospital Course (Addendum)
  History of Present Illness:    Tanner Rich 65 y.o. male referred for surgical evaluation of a hiatal hernia.  He has a long history of atrial fibrillation and is been followed by Dr. Lalla Brothers.  Other plans for him to undergo a pulmonary vein isolation but prior to this, Dr. Lalla Brothers would like his hiatal hernia fixed.  In regards to his symptoms he states that he has pretty significant reflux and is unable to go without his Nexium.  This has been the case for the last 3 to 4 years.  He denies any dysphagia.  He does not have a chronic cough or has not had any recurrent upper respiratory infections or pneumonias.  The patient and all relevant studies were evaluated by Dr. Cliffton Asters who recommended proceeding with robotic assisted paraesophageal hernia repair.  The patient was admitted electively for the procedure.   Hospital course:  The patient was admitted electively and taken to the operating room on 04/23/2021 at which time he underwent robotic assisted repair of paraesophageal hernia.  The patient tolerated the procedure well and was taken to the postanesthesia care unit in stable condition.  Postoperative hospital course:  The patient was noted to have a significant history of atrial fibrillation, therefore cardiology consultation was obtained to assist with this during the postoperative period.  Due to his initial n.p.o. status he was started on intravenous Cardizem but this has subsequently been converted over time to p.o.  He is also started on Xarelto.  He has a CHA2DS2-VASc of 4.   A swallow study was done on postoperative day #1 which showed:  IMPRESSION: No hiatal hernia. Narrowing at the gastroesophageal junction with some holdup in passage of contrast likely reflecting fundoplication and postoperative edema. No evidence of leak.  He was initially started on a dysphagia diet but on first meal developed significant epigastric discomfort and chest pain so he was subsequently changed to  a liquid diet which he tolerated.  A repeat trial of the dysphagia diet was done on postoperative day #2 and he is tolerating this much better.  He is noted to have a significant postoperative blood loss anemia which is being monitored clinically.  Most recent hemoglobin hematocrit dated 04/26/2021 are 7.1 and 22.9 respectively.  Patient has a fib with a not well controlled rate. As of 05/02, his Cardizem CD has been titrated to 360 mg daily and he has been on Rivaroxaban 20 mg at night. EP has been following him and if rate is better controlled, no need for TEE/DCCV on this admission.

## 2021-04-23 NOTE — Consult Note (Addendum)
Cardiology Consultation:   Patient ID: Tanner Rich MRN: [id number]; DOB: [birth year]  Admit date: 04/23/2021 Date of Consult: 04/23/2021  PCP:  Marcell Anger, NP   [facility]  Cardiologist:  Charlton Haws, MD  Electrophysiologist:  Lanier Prude, MD    Patient Profile:   Tanner Rich is a 65 y.o. male with a hx of prior stroke, HTN, HLD, obesity, OSA (untreated, intolerant of mask) who is being seen today for the evaluation of AFib w/RVR at the request of Dr. Cliffton Asters.  History of Present Illness:   Tanner Rich was referred to Dr. Lalla Brothers Feb for his Afib becoming increasingly symptomatic and historically on flecainide though stopped with development of RBBB, LAD/conduction system disease. Rhythm strategy planned with idea towards pursuing PVI ablation, though the pre-procedure CT noted a large  hiatal hernia that is adjacent to and compressing part of the left atrium and ablation plans held given would be unable to monitor the temp of the hernia sac during the procedure.  Was recommended evaluation and management of the hernia be done 1st with plans for Tikosyn eventually and likely post surgical repair of his hernia.  He was noted 4/26 at his pre-op eval to be in rate controlled AFib 80's-90's, he was planned to hold Xarelto and bridge with lovenox 4/25 - Last dose of Xarelto in the evening. 4/26 - Inject Lovenox 150mg  SQ in the fatty tissue of the abdomen at 8pm. No Xarelto. 4/27 - No Lovenox, no Xarelto. 4/28 - procedure date. No Lovenox, no Xarelto  He underwent surgery today Esophagoscopy - Robotic assisted laparoscopy - Paraesophageal hernia repair with ACell mesh buttress -Toupet fundoplication  In d/w PACU nurse the patient was given labetalol 5mg  once in the OR and once post op  The patient is sleepy but easily woken, knew he was in Afib coming in, drifts to sleep intermittently, denied CP   Past Medical History:  Diagnosis Date    A-fib (HCC)    Complication of anesthesia    "waking up, throat closed up"   CVA (cerebral vascular accident) (HCC) 2000   GERD (gastroesophageal reflux disease)    History of kidney stones    HLD (hyperlipidemia)    HTN (hypertension)    Obesity    OSA (obstructive sleep apnea)    OSA (obstructive sleep apnea) 05/07/2015   Right carotid artery occlusion    Chronic RICA occlusion, no stenosis LICA on 01/26/21 carotid Duplex    Past Surgical History:  Procedure Laterality Date   CARDIOVERSION N/A 06/03/2015   Procedure: CARDIOVERSION;  Surgeon: 01/28/21, MD;  Location: [facility] ENDOSCOPY;  Service: Cardiovascular;  Laterality: N/A;   CARDIOVERSION N/A 05/11/2017   Procedure: CARDIOVERSION;  Surgeon: [facility], MD;  Location: [facility] ENDOSCOPY;  Service: Cardiovascular;  Laterality: N/A;   CARDIOVERSION N/A 04/11/2020   Procedure: CARDIOVERSION;  Surgeon: [facility], MD;  Location: [facility] ENDOSCOPY;  Service: Cardiovascular;  Laterality: N/A;   COLONOSCOPY     TONSILLECTOMY       Home Medications:  Prior to Admission medications   Medication Sig Start Date End Date Taking? Authorizing Provider  enalapril (VASOTEC) 20 MG tablet Take 20 mg by mouth daily.  09/26/14  Yes [provider]  enoxaparin (LOVENOX) 150 MG/ML injection Inject 1 mL (150 mg total) into the skin daily. 03/30/21  Yes 11/26/14, MD  esomeprazole (NEXIUM) 20 MG packet Take 20 mg by mouth daily before breakfast.   Yes  [provider]  flecainide (TAMBOCOR) 100 MG tablet Take 100 mg by mouth 2 (two) times daily.   Yes [provider]  rivaroxaban (XARELTO) 20 MG TABS tablet Take 20 mg by mouth daily. 01/13/21 01/13/22 Yes [provider]  tamsulosin (FLOMAX) 0.4 MG CAPS capsule Take 0.4 mg by mouth daily.   Yes [provider]  Vitamin D, Ergocalciferol, (DRISDOL) 1.25 MG (50000 UNIT) CAPS capsule Take 50,000 Units by mouth every 30 (thirty) days. 01/09/20  Yes [provider]    Inpatient Medications: Scheduled Meds:  fentaNYL       fentaNYL       HYDROmorphone       ketorolac       metoprolol tartrate  5 mg Intravenous Q6H   Continuous Infusions:  acetaminophen     acetaminophen Stopped (04/23/21 1208)   diltiazem (CARDIZEM) infusion     lactated ringers     PRN Meds: acetaminophen, acetaminophen **OR** acetaminophen (TYLENOL) oral liquid 160 mg/5 mL, fentaNYL (SUBLIMAZE) injection, HYDROmorphone (DILAUDID) injection, labetalol, oxyCODONE **OR** oxyCODONE  Allergies:    Allergies  Allergen Reactions   Ciprofloxacin Swelling    Pt states he took med for 3 days and ankle swelling started.      Social History:   Social History   Socioeconomic History   Marital status: Married    Spouse name: Not on file   Number of children: Not on file   Years of education: Not on file   Highest education level: Not on file  Occupational History   Not on file  Tobacco Use   Smoking status: Never Smoker   Smokeless tobacco: Never Used  Vaping Use   Vaping Use: Never used  Substance and Sexual Activity   Alcohol use: No    Alcohol/week: 0.0 standard drinks   Drug use: No   Sexual activity: Not on file  Other Topics Concern   Not on file  Social History Narrative   Not on file   Social Determinants of Health   Financial Resource Strain: Not on file  Food Insecurity: Not on file  Transportation Needs: Not on file  Physical Activity: Not on file  Stress: Not on file  Social Connections: Not on file  Intimate Partner Violence: Not on file    Family History:   Family History  Problem Relation Age of Onset   Atrial fibrillation Mother    Cancer Father        BONE   Heart disease Father    Prostate cancer Other      ROS:  Please see the history of present illness.  All other ROS reviewed and negative.     Physical Exam/Data:   Vitals:   04/23/21 1322 04/23/21 1337 04/23/21 1352 04/23/21 1409  BP: 122/88 (!) 132/93  133/89 112/85  Pulse: 99 86 71 (!) 115  Resp: 19 18 16 13   Temp:      TempSrc:      SpO2: 99% 99% 98% 98%  Weight:      Height:        Intake/Output Summary (Last 24 hours) at 04/23/2021 1420 Last data filed at 04/23/2021 1116 Gross per 24 hour  Intake 1950 ml  Output 200 ml  Net 1750 ml   Last 3 Weights 04/23/2021 04/21/2021 03/27/2021  Weight (lbs) 198 lb 197 lb 6 oz 205 lb  Weight (kg) 89.812 kg 89.529 kg 92.987 kg     Body mass index is 31.01 kg/m.  General:  Well nourished, well developed, in no acute distress, sleepy post-op HEENT: normal Lymph: no adenopathy Neck: no JVD Endocrine:  No thryomegaly Vascular: No carotid bruits Cardiac: irreg-irreg, tachycardic; no murmurs gallops or rubs Lungs:  CTA b/l, no wheezing, rhonchi or rales  Abd: soft, nontender  Ext: no edema Musculoskeletal:  No deformities Skin: warm and dry  Neuro:  No  obvious/gross focal abnormalities noted Psych:  Normal affect   EKG:  The EKG was personally reviewed and demonstrates:    04/21/21: AFib 94bpm, RBBB, LAD  Telemetry:  Telemetry was personally reviewed and demonstrates:   Afib 110's-160's, most consistently while we are with him, 130's  Relevant CV Studies:  03/06/2021: TTE IMPRESSIONS   1. Left ventricular ejection fraction, by estimation, is 60 to 65%. The  left ventricle has normal function. The left ventricle has no regional  wall motion abnormalities. There is mild concentric left ventricular  hypertrophy. Left ventricular diastolic  parameters are consistent with Grade I diastolic dysfunction (impaired  relaxation).   2. Right ventricular systolic function is normal. The right ventricular  size is normal. There is normal pulmonary artery systolic pressure. The  estimated right ventricular systolic pressure is 22.2 mmHg.   3. Left atrial size was moderately dilated.   4. The mitral valve is grossly normal. Trivial mitral valve  regurgitation.   5. The aortic valve is  tricuspid. Aortic valve regurgitation is not  visualized.   6. The inferior vena cava is normal in size with greater than 50%  respiratory variability, suggesting right atrial pressure of 3 mmHg.    Laboratory Data:  High Sensitivity Troponin:  No results for input(s): TROPONINIHS in the last 720 hours.   Chemistry Recent Labs  Lab 04/21/21 1345  NA 137  K 3.7  CL 106  CO2 23  GLUCOSE 180*  BUN 19  CREATININE 1.24  CALCIUM 9.5  GFRNONAA >60  ANIONGAP 8    Recent Labs  Lab 04/21/21 1345  PROT 6.8  ALBUMIN 3.8  AST 21  ALT 10  ALKPHOS 62  BILITOT 1.5*   Hematology Recent Labs  Lab 04/21/21 1345  WBC 5.9  RBC 4.85  HGB 13.3  HCT 42.4  MCV 87.4  MCH 27.4  MCHC 31.4  RDW 12.7  PLT 200   BNPNo results for input(s): BNP, PROBNP in the last 168 hours.  DDimer No results for input(s): DDIMER in the last 168 hours.   Radiology/Studies:   DG CHEST PORT 1 VIEW Result Date: 04/23/2021 CLINICAL DATA:  Posterior para of parasagittal hernia EXAM: PORTABLE CHEST 1 VIEW COMPARISON:  04/21/2021 FINDINGS: Moderate right apical pneumothorax. No evidence of tension. Increased interstitial prominence. Bibasilar atelectasis. No substantial pleural effusion. Stable cardiomediastinal contours. Retrocardiac hernia is not identified. IMPRESSION: Moderate right apical pneumothorax. Increased interstitial prominence may reflect edema. Bibasilar atelectasis. These results Tanner Rich be called to the ordering clinician or representative by the Radiologist Assistant, and communication documented in the PACS or [employer] Dashboard. Electronically Signed   By: Tanner SpanishPraneil  Rich M.D.   On: 04/23/2021 13:20     Assessment and Plan:   1. Paroxysmal AFib     CHA2DS2Vasc is 4 (including prior stroke), outpt on xarelto  Not surprising to have RVR with surgical stressor, and not unexpected off AAD that he had recurrent AF Tanner Rich start diltiazem for rate control Looks like he also has IV metoprolol ordered  (not gotten any), Tanner Rich hold off on this with dilt gtt being started, can add if needed as  a PRN  Resume anticoagulation as soon as safe to do so post op, decision deferred to surgical team  BP stable  Unable to consider rhythm control at this time given interruption of his a/c  DO NOT start flecainide (still appears as a home med.)    2. He has a mod apical pneumothorax 9report notes Tanner Rich be called to ordering clinician)     Deferred to surgical  Team     Does not appear in resp distress, sat 97-98% on 2L n/c     Risk Assessment/Risk Scores:    For questions or updates, please contact [facility] Please consult [email or web address] for contact info under    Signed, Tanner Pigeon, PA-C  04/23/2021 2:20 PM'  I have seen and examined this patient with Tanner Rich.  Agree with above, note added to reflect my findings.  On exam, irregular, no murmurs, lungs clear.  Saw the patient in PACU postop from hiatal hernia surgery.  He was in atrial fibrillation with rapid rates during the surgery.  At this point, there is potentially a plan for ablation.  We Blasa Raisch plan for a rate control strategy for now.  We Ericah Scotto start him on diltiazem IV drip.  Katreena Schupp likely transition to p.o. tomorrow.  Tanner Lahman M. Shandricka Monroy MD 04/23/2021 3:41 PM

## 2021-04-24 ENCOUNTER — Encounter (HOSPITAL_COMMUNITY): Payer: Self-pay | Admitting: Thoracic Surgery (Cardiothoracic Vascular Surgery)

## 2021-04-24 ENCOUNTER — Inpatient Hospital Stay (HOSPITAL_COMMUNITY): Payer: Medicare Other

## 2021-04-24 LAB — BASIC METABOLIC PANEL
Anion gap: 8 (ref 5–15)
BUN: 27 mg/dL — ABNORMAL HIGH (ref 8–23)
CO2: 24 mmol/L (ref 22–32)
Calcium: 8.7 mg/dL — ABNORMAL LOW (ref 8.9–10.3)
Chloride: 104 mmol/L (ref 98–111)
Creatinine, Ser: 1.34 mg/dL — ABNORMAL HIGH (ref 0.61–1.24)
GFR, Estimated: 58 mL/min — ABNORMAL LOW (ref >60)
Glucose, Bld: 142 mg/dL — ABNORMAL HIGH (ref 70–99)
Potassium: 4.4 mmol/L (ref 3.5–5.1)
Sodium: 136 mmol/L (ref 135–145)

## 2021-04-24 LAB — CBC
HCT: 29.6 % — ABNORMAL LOW (ref 39.0–52.0)
Hemoglobin: 9.4 g/dL — ABNORMAL LOW (ref 13.0–17.0)
MCH: 28.3 pg (ref 26.0–34.0)
MCHC: 31.8 g/dL (ref 30.0–36.0)
MCV: 89.2 fL (ref 80.0–100.0)
Platelets: 192 10*3/uL (ref 150–400)
RBC: 3.32 MIL/uL — ABNORMAL LOW (ref 4.22–5.81)
RDW: 12.9 % (ref 11.5–15.5)
WBC: 10.5 10*3/uL (ref 4.0–10.5)
nRBC: 0 % (ref 0.0–0.2)

## 2021-04-24 LAB — SURGICAL PATHOLOGY

## 2021-04-24 MED ORDER — ONDANSETRON 4 MG PO TBDP: 4.0000 mg | ORAL_TABLET | Freq: Four times a day (QID) | ORAL | Status: DC

## 2021-04-24 MED ORDER — KETOROLAC TROMETHAMINE 15 MG/ML IJ SOLN: 15.0000 mg | Freq: Three times a day (TID) | INTRAMUSCULAR | Status: DC | PRN

## 2021-04-24 MED ORDER — DILTIAZEM HCL ER COATED BEADS 180 MG PO CP24: 300.0000 mg | ORAL_CAPSULE | Freq: Every day | ORAL | Status: DC

## 2021-04-24 MED ORDER — KETOROLAC TROMETHAMINE 15 MG/ML IJ SOLN
30.0000 mg | Freq: Four times a day (QID) | INTRAMUSCULAR | Status: DC
  Administered 2021-04-24 – 2021-04-27 (×9): 30 mg via INTRAVENOUS
  Filled 2021-04-24 (×9): qty 2

## 2021-04-24 MED ORDER — RIVAROXABAN 20 MG PO TABS
20.0000 mg | ORAL_TABLET | Freq: Every day | ORAL | Status: DC
  Administered 2021-04-24 – 2021-04-26 (×3): 20 mg via ORAL
  Filled 2021-04-24 (×3): qty 1

## 2021-04-24 MED ORDER — ONDANSETRON HCL 4 MG/2ML IJ SOLN: 4.0000 mg | Freq: Four times a day (QID) | INTRAMUSCULAR | Status: DC | PRN

## 2021-04-24 MED ORDER — DILTIAZEM HCL-DEXTROSE 125-5 MG/125ML-% IV SOLN (PREMIX)
5.0000 mg/h | INTRAVENOUS | Status: DC
  Administered 2021-04-24 – 2021-04-25 (×2): 10 mg/h via INTRAVENOUS
  Filled 2021-04-24 (×2): qty 125

## 2021-04-24 MED ORDER — IOHEXOL 300 MG/ML  SOLN
150.0000 mL | Freq: Once | INTRAMUSCULAR | Status: AC | PRN
  Administered 2021-04-24: 150 mL via ORAL

## 2021-04-24 MED ORDER — ONDANSETRON 4 MG PO TBDP
4.0000 mg | ORAL_TABLET | Freq: Four times a day (QID) | ORAL | Status: DC | PRN
Start: 2021-04-24 — End: 2021-04-27

## 2021-04-24 MED ORDER — ONDANSETRON HCL 4 MG/2ML IJ SOLN: 4.0000 mg | Freq: Four times a day (QID) | INTRAMUSCULAR | Status: DC

## 2021-04-24 NOTE — Plan of Care (Signed)

## 2021-04-24 NOTE — Progress Notes (Addendum)
Pt was experiencing acute pain in R lateral neck and clavicle that was intermittent, for several mins. RN paged PA twice. NT did EKG. Pt said his sx subsided and he is now resting comfortably. RN will continue to monitor. RN spoke with PA and PA said to continue to watch pt.

## 2021-04-24 NOTE — Anesthesia Postprocedure Evaluation (Signed)
Anesthesia Post Note  Patient: Tanner Rich  Procedure(s) Performed: XI ROBOTIC ASSISTED PARAESOPHAGEAL HERNIA REPAIR WITH FUNDOPLICATION USING ACELL GENTRIX SURGICAL MATRIX (N/A Chest) ESOPHAGOGASTRODUODENOSCOPY (EGD) (N/A )     Patient location during evaluation: PACU Anesthesia Type: General Level of consciousness: awake and alert Pain management: pain level controlled Vital Signs Assessment: post-procedure vital signs reviewed and stable Respiratory status: spontaneous breathing, nonlabored ventilation, respiratory function stable and patient connected to nasal cannula oxygen Cardiovascular status: blood pressure returned to baseline and stable Postop Assessment: no apparent nausea or vomiting Anesthetic complications: no   No complications documented.  Last Vitals:  Vitals:   04/24/21 0411 04/24/21 0731  BP: 110/85 134/82  Pulse: 98 (!) 107  Resp: 19 (!) 24  Temp: 36.8 C 36.9 C  SpO2: 94% (!) 86%    Last Pain:  Vitals:   04/24/21 0731  TempSrc: Oral  PainSc: 5                  Ulice Follett

## 2021-04-24 NOTE — Progress Notes (Addendum)
      [address]       [place] [address]             [phone]     Did not tolerate soft diet. Had severe epigastric/abdominal discomfort , mostly a pressure sensation. Will make NPO . CXR is still pending.   Rowe Clack, PA-C   Addendum: spoke with Dr lightfoot who feels he should be on liquid diet for fluid intake.  Rowe Clack, PA-C

## 2021-04-24 NOTE — Progress Notes (Addendum)
[address]       [place] [address]             [phone]      1 Day Post-Op Procedure(s) (LRB): XI ROBOTIC ASSISTED PARAESOPHAGEAL HERNIA REPAIR WITH FUNDOPLICATION USING ACELL GENTRIX SURGICAL MATRIX (N/A) ESOPHAGOGASTRODUODENOSCOPY (EGD) (N/A) Subjective: C/o of right shoulder pain  Objective: Vital signs in last 24 hours: Temp:  [97.4 F (36.3 C)-98.4 F (36.9 C)] 98.4 F (36.9 C) (04/29 0731) Pulse Rate:  [60-128] 107 (04/29 0731) Cardiac Rhythm: Atrial fibrillation (04/29 0700) Resp:  [11-24] 24 (04/29 0731) BP: (82-160)/(60-114) 134/82 (04/29 0731) SpO2:  [86 %-99 %] 86 % (04/29 0731)  Hemodynamic parameters for last 24 hours:    Intake/Output from previous day: 04/28 0701 - 04/29 0700 In: 1950 [I.V.:1600; IV Piggyback:350] Out: 550 [Urine:550] Intake/Output this shift: No intake/output data recorded.  General appearance: alert, cooperative and no distress Heart: irregularly irregular rhythm and tachy Lungs: clear BS Abdomen: soft, mod echymosis from surgical incisions, no definite hematome- one site with minor oozing- held pressure and appears to have stopped Extremities: warm/perfused Wound: as noted above  Lab Results: Recent Labs    04/21/21 1345 04/24/21 0018  WBC 5.9 10.5  HGB 13.3 9.4*  HCT 42.4 29.6*  PLT 200 192   BMET:  Recent Labs    04/21/21 1345 04/24/21 0018  NA 137 136  K 3.7 4.4  CL 106 104  CO2 23 24  GLUCOSE 180* 142*  BUN 19 27*  CREATININE 1.24 1.34*  CALCIUM 9.5 8.7*    PT/INR:  Recent Labs    04/21/21 1345  LABPROT 15.4*  INR 1.2   ABG    Component Value Date/Time   PHART 7.428 04/21/2021 1400   HCO3 24.2 04/21/2021 1400   O2SAT 97.8 04/21/2021 1400   CBG (last 3)  No results for input(s): GLUCAP in the last 72 hours.  Meds Scheduled Meds: . acetaminophen  1,000 mg Oral Q6H  . enoxaparin (LOVENOX) injection  40 mg Subcutaneous Q24H  . ketorolac  15 mg Intravenous Q6H    Continuous Infusions: . diltiazem (CARDIZEM) infusion 10 mg/hr (04/24/21 0247)   PRN Meds:.morphine injection, ondansetron **OR** ondansetron (ZOFRAN) IV  Xrays DG CHEST PORT 1 VIEW  Result Date: 04/23/2021 CLINICAL DATA:  Posterior para of parasagittal hernia EXAM: PORTABLE CHEST 1 VIEW COMPARISON:  04/21/2021 FINDINGS: Moderate right apical pneumothorax. No evidence of tension. Increased interstitial prominence. Bibasilar atelectasis. No substantial pleural effusion. Stable cardiomediastinal contours. Retrocardiac hernia is not identified. IMPRESSION: Moderate right apical pneumothorax. Increased interstitial prominence may reflect edema. Bibasilar atelectasis. These results will be called to the ordering clinician or representative by the Radiologist Assistant, and communication documented in the PACS or [employer]. Electronically Signed   By: Guadlupe Spanish M.D.   On: 04/23/2021 13:20    Assessment/Plan: S/P Procedure(s) (LRB): XI ROBOTIC ASSISTED PARAESOPHAGEAL HERNIA REPAIR WITH FUNDOPLICATION USING ACELL GENTRIX SURGICAL MATRIX (N/A) ESOPHAGOGASTRODUODENOSCOPY (EGD) (N/A)    1 afeb,  VSS, afib- on cardizem gtt per EP with stable rate control 2 sats ok on 2 liters, CXR - pntx is slightly bigger- may need to consider tube 3 for swallow done, results pending- cont NPO till known 4 ABLA , H/H 9.4/29.6- monitor , blood loss during surgery was minimal- will ned to determine timing of ACRX, does have mod echymosis at surgical site- bleeding appears controlled 5 creat up some, may need to d/c ketorolac- on low dose- change to  prn, change to q 8 6 some issues with pain control- will be easier when NPO status is changed 7 afib management as per EP consultant     LOS: 1 day    Rowe Clack PA-C Pager [phone] 04/24/2021  Agree with above Esophagram clear, will advance diet Will watch pneumoT for now.  Likely will place CT tomorrow Continue titration for afib  Dajha Urquilla O  Jaret Coppedge

## 2021-04-24 NOTE — Plan of Care (Signed)
  Problem: Education: Goal: Knowledge of General Education information will improve Description: Including pain rating scale, medication(s)/side effects and non-pharmacologic comfort measures Outcome: Progressing   Problem: Health Behavior/Discharge Planning: Goal: Ability to manage health-related needs will improve Outcome: Progressing   Problem: Clinical Measurements: Goal: Ability to maintain clinical measurements within normal limits will improve Outcome: Progressing Goal: Will remain free from infection Outcome: Progressing Goal: Diagnostic test results will improve Outcome: Progressing Goal: Respiratory complications will improve Outcome: Progressing Goal: Cardiovascular complication will be avoided Outcome: Progressing   Problem: Coping: Goal: Level of anxiety will decrease Outcome: Progressing   Problem: Elimination: Goal: Will not experience complications related to bowel motility Outcome: Progressing Goal: Will not experience complications related to urinary retention Outcome: Progressing   Problem: Safety: Goal: Ability to remain free from injury will improve Outcome: Progressing   Problem: Skin Integrity: Goal: Risk for impaired skin integrity will decrease Outcome: Progressing   

## 2021-04-24 NOTE — Progress Notes (Addendum)
Progress Note  Patient Name: Tanner Rich Date of Encounter: 04/24/2021  [facility] Cardiologist: Charlton Haws, MD   Subjective   No CP, has post pain, denies SOB  Inpatient Medications    Scheduled Meds:  acetaminophen  1,000 mg Oral Q6H   diltiazem  300 mg Oral Daily   ketorolac  30 mg Intravenous Q6H   rivaroxaban  20 mg Oral Q supper   Continuous Infusions:   PRN Meds: morphine injection, ondansetron **OR** ondansetron (ZOFRAN) IV   Vital Signs    Vitals:   04/23/21 1956 04/24/21 0010 04/24/21 0411 04/24/21 0731  BP: 121/81 109/80 110/85 134/82  Pulse: 90 77 98 (!) 107  Resp: 19 20 19  (!) 24  Temp: 97.7 F (36.5 C) 97.6 F (36.4 C) 98.3 F (36.8 C) 98.4 F (36.9 C)  TempSrc: Oral Oral Oral Oral  SpO2: 98% 92% 94% (!) 86%  Weight:      Height:        Intake/Output Summary (Last 24 hours) at 04/24/2021 1231 Last data filed at 04/24/2021 0011 Gross per 24 hour  Intake --  Output 350 ml  Net -350 ml   Last 3 Weights 04/23/2021 04/21/2021 03/27/2021  Weight (lbs) 198 lb 197 lb 6 oz 205 lb  Weight (kg) 89.812 kg 89.529 kg 92.987 kg      Telemetry    AFib 80's-90's overnight >> today with awake/activity 100's-130's - Personally Reviewed  ECG    No new EKGs - Personally Reviewed  Physical Exam   GEN: No acute distress.   Neck: No JVD Cardiac:  irreg-irreg, tachycardic, no murmurs, rubs, or gallops.  Respiratory: CTA b/l. GI: not examined, post-op deferred  MS: No edema; No deformity. Neuro:  Nonfocal  Psych: Normal affect   Labs    High Sensitivity Troponin:  No results for input(s): TROPONINIHS in the last 720 hours.    Chemistry Recent Labs  Lab 04/21/21 1345 04/24/21 0018  NA 137 136  K 3.7 4.4  CL 106 104  CO2 23 24  GLUCOSE 180* 142*  BUN 19 27*  CREATININE 1.24 1.34*  CALCIUM 9.5 8.7*  PROT 6.8  --   ALBUMIN 3.8  --   AST 21  --   ALT 10  --   ALKPHOS 62  --   BILITOT 1.5*  --   GFRNONAA >60 58*  ANIONGAP 8 8      Hematology Recent Labs  Lab 04/21/21 1345 04/24/21 0018  WBC 5.9 10.5  RBC 4.85 3.32*  HGB 13.3 9.4*  HCT 42.4 29.6*  MCV 87.4 89.2  MCH 27.4 28.3  MCHC 31.4 31.8  RDW 12.7 12.9  PLT 200 192    BNPNo results for input(s): BNP, PROBNP in the last 168 hours.   DDimer No results for input(s): DDIMER in the last 168 hours.   Radiology    DG CHEST PORT 1 VIEW  Result Date: 04/24/2021 CLINICAL DATA:  Postoperative status. EXAM: PORTABLE CHEST 1 VIEW COMPARISON:  April 23, 2021. FINDINGS: Stable cardiomediastinal silhouette. Stable moderate size right apical pneumothorax. Stable left basilar subsegmental atelectasis. Bony thorax is unremarkable. IMPRESSION: Stable moderate right apical pneumothorax. Stable left basilar subsegmental atelectasis. Electronically Signed   By: April 25, 2021 M.D.   On: 04/24/2021 08:56   DG CHEST PORT 1 VIEW  Result Date: 04/23/2021 CLINICAL DATA:  Posterior para of parasagittal hernia EXAM: PORTABLE CHEST 1 VIEW COMPARISON:  04/21/2021 FINDINGS: Moderate right apical pneumothorax. No evidence of tension.  Increased interstitial prominence. Bibasilar atelectasis. No substantial pleural effusion. Stable cardiomediastinal contours. Retrocardiac hernia is not identified. IMPRESSION: Moderate right apical pneumothorax. Increased interstitial prominence may reflect edema. Bibasilar atelectasis. These results Gunnard Dorrance be called to the ordering clinician or representative by the Radiologist Assistant, and communication documented in the PACS or [employer]. Electronically Signed   By: Guadlupe Spanish M.D.   On: 04/23/2021 13:20   DG ESOPHAGUS W SINGLE CM (SOL OR THIN BA)  Result Date: 04/24/2021 CLINICAL DATA:  Post paraesophageal hernia repair and toupet fundoplication EXAM: ESOPHOGRAM/BARIUM SWALLOW TECHNIQUE: Single contrast examination was performed using water soluble Omnipaque 300. FLUOROSCOPY TIME:  Fluoroscopy Time:  18 seconds Radiation Exposure Index (if  provided by the fluoroscopic device): 3.6 mGy And to pay fundoplication COMPARISON:  None. FINDINGS: Patient swallowed contrast without difficulty. Normal esophageal contours. There is no hiatal hernia. Narrowing at the gastroesophageal junction with some holdup in passage of contrast. No contrast extravasation. IMPRESSION: No hiatal hernia. Narrowing at the gastroesophageal junction with some holdup in passage of contrast likely reflecting fundoplication and postoperative edema. No evidence of leak. Electronically Signed   By: Guadlupe Spanish M.D.   On: 04/24/2021 09:42    Cardiac Studies   03/06/2021: TTE IMPRESSIONS   1. Left ventricular ejection fraction, by estimation, is 60 to 65%. The  left ventricle has normal function. The left ventricle has no regional  wall motion abnormalities. There is mild concentric left ventricular  hypertrophy. Left ventricular diastolic  parameters are consistent with Grade I diastolic dysfunction (impaired  relaxation).   2. Right ventricular systolic function is normal. The right ventricular  size is normal. There is normal pulmonary artery systolic pressure. The  estimated right ventricular systolic pressure is 22.2 mmHg.   3. Left atrial size was moderately dilated.   4. The mitral valve is grossly normal. Trivial mitral valve  regurgitation.   5. The aortic valve is tricuspid. Aortic valve regurgitation is not  visualized.   6. The inferior vena cava is normal in size with greater than 50%  respiratory variability, suggesting right atrial pressure of 3 mmHg.   Patient Profile     65 y.o. male with a hx of prior stroke, HTN, HLD, obesity, OSA (untreated, intolerant of mask)  Admitted yesterday for and underwent  Esophagoscopy - Robotic assisted laparoscopy - Paraesophageal hernia repair with ACell mesh buttress -Toupet fundoplication  Out patient Tanner Rich was referred to Dr. Lalla Brothers Feb for his Afib becoming increasingly symptomatic and  historically on flecainide though stopped with development of RBBB, LAD/conduction system disease. Rhythm strategy planned with idea towards pursuing PVI ablation, though the pre-procedure CT noted a large  hiatal hernia that is adjacent to and compressing part of the left atrium and ablation plans held given would be unable to monitor the temp of the hernia sac during the procedure.  Was recommended evaluation and management of the hernia be done 1st with plans for Tikosyn eventually and likely post surgical repair of his hernia  Assessment & Plan    1. Paroxysmal AFib     CHA2DS2Vasc is 4 (including prior stroke), outpt on xarelto  Rates overnight controlled 80's-80's, with activity and pain rates 120's-130's He has been seen and examined by Dr. Elberta Fortis He is NPO, Tanner Rich continue dilt gtt plan to transition to PO BP looks OK  Given interruption in his /ac for surgery no plans for DCCV Recommend as soon as safe post op to resume anticoagulation, and leave  Timing of this  to the surgical team   Continue care as per attending/surgical  team 2. Post-op pneumothorax     Anemia     Pain           For questions or updates, please contact [facility] Please consult [email or web address] for contact info under        Signed, Tanner Pigeon, PA-C  04/24/2021, 12:31 PM    I have seen and examined this patient with Francis Dowse.  Agree with above, note added to reflect my findings.  On exam, tachycardic, irregular patient remains in atrial fibrillation.  Heart rates are in the 120s to 130s.  This could be a pain response given his recent surgery.  The patient does appear quite uncomfortable.  Patient is currently on a diltiazem drip as he is NPO.  We Cru Kritikos continue this drip for now.  We Maxamillion Banas plan to switch to p.o. diltiazem once he is taking p.o. medications.    Tulani Kidney M. Flavia Bruss MD 04/24/2021 3:12 PM

## 2021-04-25 ENCOUNTER — Inpatient Hospital Stay (HOSPITAL_COMMUNITY): Payer: Medicare Other

## 2021-04-25 LAB — CBC
HCT: 24.4 % — ABNORMAL LOW (ref 39.0–52.0)
HCT: 23.5 % — ABNORMAL LOW (ref 39.0–52.0)
Hemoglobin: 7.8 g/dL — ABNORMAL LOW (ref 13.0–17.0)
Hemoglobin: 7.3 g/dL — ABNORMAL LOW (ref 13.0–17.0)
MCH: 28.1 pg (ref 26.0–34.0)
MCH: 27.7 pg (ref 26.0–34.0)
MCHC: 32 g/dL (ref 30.0–36.0)
MCHC: 31.1 g/dL (ref 30.0–36.0)
MCV: 87.8 fL (ref 80.0–100.0)
MCV: 89 fL (ref 80.0–100.0)
Platelets: 175 10*3/uL (ref 150–400)
Platelets: 177 10*3/uL (ref 150–400)
RBC: 2.78 MIL/uL — ABNORMAL LOW (ref 4.22–5.81)
RBC: 2.64 MIL/uL — ABNORMAL LOW (ref 4.22–5.81)
RDW: 13.4 % (ref 11.5–15.5)
RDW: 13.5 % (ref 11.5–15.5)
WBC: 10.9 10*3/uL — ABNORMAL HIGH (ref 4.0–10.5)
WBC: 9.8 10*3/uL (ref 4.0–10.5)
nRBC: 0 % (ref 0.0–0.2)
nRBC: 0 % (ref 0.0–0.2)

## 2021-04-25 LAB — BASIC METABOLIC PANEL
Anion gap: 10 (ref 5–15)
BUN: 35 mg/dL — ABNORMAL HIGH (ref 8–23)
CO2: 26 mmol/L (ref 22–32)
Calcium: 9.1 mg/dL (ref 8.9–10.3)
Chloride: 102 mmol/L (ref 98–111)
Creatinine, Ser: 1.3 mg/dL — ABNORMAL HIGH (ref 0.61–1.24)
GFR, Estimated: >60 mL/min (ref >60)
Glucose, Bld: 121 mg/dL — ABNORMAL HIGH (ref 70–99)
Potassium: 4.4 mmol/L (ref 3.5–5.1)
Sodium: 138 mmol/L (ref 135–145)

## 2021-04-25 MED ORDER — DILTIAZEM HCL ER COATED BEADS 180 MG PO CP24
300.0000 mg | ORAL_CAPSULE | Freq: Every day | ORAL | Status: DC
  Administered 2021-04-25 – 2021-04-26 (×2): 300 mg via ORAL
  Filled 2021-04-25 (×2): qty 1

## 2021-04-25 NOTE — Progress Notes (Addendum)
[address]       [place] [address]             [phone]      2 Days Post-Op Procedure(s) (LRB): XI ROBOTIC ASSISTED PARAESOPHAGEAL HERNIA REPAIR WITH FUNDOPLICATION USING ACELL GENTRIX SURGICAL MATRIX (N/A) ESOPHAGOGASTRODUODENOSCOPY (EGD) (N/A) Subjective: C/o feeling pretty weak  Objective: Vital signs in last 24 hours: Temp:  [97.8 F (36.6 C)-98.9 F (37.2 C)] 98.9 F (37.2 C) (04/30 0730) Pulse Rate:  [92-100] 92 (04/30 0331) Cardiac Rhythm: Atrial fibrillation (04/30 0331) Resp:  [15-27] 19 (04/30 0730) BP: (113-140)/(64-94) 113/64 (04/30 0730) SpO2:  [92 %-94 %] 93 % (04/30 0331)  Hemodynamic parameters for last 24 hours:    Intake/Output from previous day: 04/29 0701 - 04/30 0700 In: 240 [P.O.:240] Out: 1026 [Urine:1026] Intake/Output this shift: Total I/O In: -  Out: 300 [Urine:300]  General appearance: alert, cooperative, fatigued and no distress Heart: irregularly irregular rhythm Lungs: clear to auscultation bilaterally Abdomen: benign Extremities: no edema Wound: echymosis, no obvious hematoma  Lab Results: Recent Labs    04/24/21 0018 04/25/21 0116  WBC 10.5 10.9*  HGB 9.4* 7.8*  HCT 29.6* 24.4*  PLT 192 175   BMET:  Recent Labs    04/24/21 0018 04/25/21 0116  NA 136 138  K 4.4 4.4  CL 104 102  CO2 24 26  GLUCOSE 142* 121*  BUN 27* 35*  CREATININE 1.34* 1.30*  CALCIUM 8.7* 9.1    PT/INR: No results for input(s): LABPROT, INR in the last 72 hours. ABG    Component Value Date/Time   PHART 7.428 04/21/2021 1400   HCO3 24.2 04/21/2021 1400   O2SAT 97.8 04/21/2021 1400   CBG (last 3)  No results for input(s): GLUCAP in the last 72 hours.  Meds Scheduled Meds: . acetaminophen  1,000 mg Oral Q6H  . diltiazem  300 mg Oral Daily  . ketorolac  30 mg Intravenous Q6H  . rivaroxaban  20 mg Oral Q supper   Continuous Infusions: PRN Meds:.morphine injection, ondansetron **OR** ondansetron (ZOFRAN)  IV  Xrays DG CHEST PORT 1 VIEW  Result Date: 04/24/2021 CLINICAL DATA:  One day postop from hiatal hernia repair. Chest pain. Follow-up pneumothorax. EXAM: PORTABLE CHEST 1 VIEW COMPARISON:  Prior today FINDINGS: Moderate right apical pneumothorax shows mild increase in size since earlier study today. Mild pneumomediastinum is unchanged. Low lung volumes and bibasilar atelectasis show no significant change. Heart size is stable. IMPRESSION: Mild increase in size of moderate right apical pneumothorax. Stable mild pneumomediastinum. Stable low lung volumes and bibasilar atelectasis. Electronically Signed   By: Danae Orleans M.D.   On: 04/24/2021 13:09   DG CHEST PORT 1 VIEW  Result Date: 04/24/2021 CLINICAL DATA:  Postoperative status. EXAM: PORTABLE CHEST 1 VIEW COMPARISON:  April 23, 2021. FINDINGS: Stable cardiomediastinal silhouette. Stable moderate size right apical pneumothorax. Stable left basilar subsegmental atelectasis. Bony thorax is unremarkable. IMPRESSION: Stable moderate right apical pneumothorax. Stable left basilar subsegmental atelectasis. Electronically Signed   By: Lupita Raider M.D.   On: 04/24/2021 08:56   DG CHEST PORT 1 VIEW  Result Date: 04/23/2021 CLINICAL DATA:  Posterior para of parasagittal hernia EXAM: PORTABLE CHEST 1 VIEW COMPARISON:  04/21/2021 FINDINGS: Moderate right apical pneumothorax. No evidence of tension. Increased interstitial prominence. Bibasilar atelectasis. No substantial pleural effusion. Stable cardiomediastinal contours. Retrocardiac hernia is not identified. IMPRESSION: Moderate right apical pneumothorax. Increased interstitial prominence may reflect edema. Bibasilar atelectasis. These results will  be called to the ordering clinician or representative by the Radiologist Assistant, and communication documented in the PACS or [employer]. Electronically Signed   By: Guadlupe Spanish M.D.   On: 04/23/2021 13:20   DG ESOPHAGUS W SINGLE CM (SOL OR THIN  BA)  Result Date: 04/24/2021 CLINICAL DATA:  Post paraesophageal hernia repair and toupet fundoplication EXAM: ESOPHOGRAM/BARIUM SWALLOW TECHNIQUE: Single contrast examination was performed using water soluble Omnipaque 300. FLUOROSCOPY TIME:  Fluoroscopy Time:  18 seconds Radiation Exposure Index (if provided by the fluoroscopic device): 3.6 mGy And to pay fundoplication COMPARISON:  None. FINDINGS: Patient swallowed contrast without difficulty. Normal esophageal contours. There is no hiatal hernia. Narrowing at the gastroesophageal junction with some holdup in passage of contrast. No contrast extravasation. IMPRESSION: No hiatal hernia. Narrowing at the gastroesophageal junction with some holdup in passage of contrast likely reflecting fundoplication and postoperative edema. No evidence of leak. Electronically Signed   By: Guadlupe Spanish M.D.   On: 04/24/2021 09:42    Assessment/Plan: S/P Procedure(s) (LRB): XI ROBOTIC ASSISTED PARAESOPHAGEAL HERNIA REPAIR WITH FUNDOPLICATION USING ACELL GENTRIX SURGICAL MATRIX (N/A) ESOPHAGOGASTRODUODENOSCOPY (EGD) (N/A)  1 afeb, VSS with afib at controlled rate- no on po cardizem, , on xarelto 2 sats ok on 2 liters 3 CXR - stable right pntx 4 H/H with significant drop over preop- h/h now 7.8/24- only obvious source is abdominal echymosis but not sure this is explained. Monitor closely on [facility] May need to consider transfusion as he is pretty weak 5 creat stable at 1.3, remains on current toradol dose 6 tol clears- may be able to advance but had significant pain with pudding thick yesterday so may want to cont to go slow 7 routine pulm toilet/rehab       LOS: 2 days    Rowe Clack PA-C Pager [phone] 04/25/2021    With above. Atrial fibrillation improved.  Patient is transitioning to oral calcium channel blockers. Will advance diet to dysphagia diet. Rechecking hemoglobin today. Hopefully home tomorrow.  Rhianon Zabawa Keane Scrape

## 2021-04-25 NOTE — Plan of Care (Signed)

## 2021-04-25 NOTE — Progress Notes (Signed)
Progress Note  Patient Name: Tanner Rich Date of Encounter: 04/25/2021  [facility] Cardiologist: Charlton Haws, MD   Subjective   Feels weak today, but without chest pain.  Only complaining of postoperative pain.  Inpatient Medications    Scheduled Meds: . acetaminophen  1,000 mg Oral Q6H  . ketorolac  30 mg Intravenous Q6H  . rivaroxaban  20 mg Oral Q supper   Continuous Infusions: . diltiazem (CARDIZEM) infusion 10 mg/hr (04/25/21 0753)   PRN Meds: morphine injection, ondansetron **OR** ondansetron (ZOFRAN) IV   Vital Signs    Vitals:   04/24/21 1929 04/24/21 2229 04/25/21 0331 04/25/21 0730  BP: (!) 140/94 136/68 139/65 113/64  Pulse: 100 92 92   Resp: 20 20 15 19   Temp: 98.5 F (36.9 C) 97.8 F (36.6 C) 98 F (36.7 C) 98.9 F (37.2 C)  TempSrc: Oral Oral Oral Oral  SpO2: 92% 92% 93%   Weight:      Height:        Intake/Output Summary (Last 24 hours) at 04/25/2021 0816 Last data filed at 04/25/2021 0732 Gross per 24 hour  Intake 240 ml  Output 1326 ml  Net -1086 ml   Last 3 Weights 04/23/2021 04/21/2021 03/27/2021  Weight (lbs) 198 lb 197 lb 6 oz 205 lb  Weight (kg) 89.812 kg 89.529 kg 92.987 kg      Telemetry    Atrial fibrillation, rates between 90 and 110 bpm-personally reviewed   ECG    None new  Physical Exam   GEN: Well nourished, well developed, in no acute distress  HEENT: normal  Neck: no JVD, carotid bruits, or masses Cardiac: Irregular; no murmurs, rubs, or gallops,no edema  Respiratory:  clear to auscultation bilaterally, normal work of breathing GI: soft, nontender, nondistended, + BS MS: no deformity or atrophy  Skin: warm and dry Neuro:  Strength and sensation are intact Psych: euthymic mood, full affect   Labs    High Sensitivity Troponin:  No results for input(s): TROPONINIHS in the last 720 hours.    Chemistry Recent Labs  Lab 04/21/21 1345 04/24/21 0018 04/25/21 0116  NA 137 136 138  K 3.7 4.4 4.4  CL 106  104 102  CO2 23 24 26   GLUCOSE 180* 142* 121*  BUN 19 27* 35*  CREATININE 1.24 1.34* 1.30*  CALCIUM 9.5 8.7* 9.1  PROT 6.8  --   --   ALBUMIN 3.8  --   --   AST 21  --   --   ALT 10  --   --   ALKPHOS 62  --   --   BILITOT 1.5*  --   --   GFRNONAA >60 58* >60  ANIONGAP 8 8 10      Hematology Recent Labs  Lab 04/21/21 1345 04/24/21 0018 04/25/21 0116  WBC 5.9 10.5 10.9*  RBC 4.85 3.32* 2.78*  HGB 13.3 9.4* 7.8*  HCT 42.4 29.6* 24.4*  MCV 87.4 89.2 87.8  MCH 27.4 28.3 28.1  MCHC 31.4 31.8 32.0  RDW 12.7 12.9 13.4  PLT 200 192 175    BNPNo results for input(s): BNP, PROBNP in the last 168 hours.   DDimer No results for input(s): DDIMER in the last 168 hours.   Radiology    DG CHEST PORT 1 VIEW  Result Date: 04/24/2021 CLINICAL DATA:  One day postop from hiatal hernia repair. Chest pain. Follow-up pneumothorax. EXAM: PORTABLE CHEST 1 VIEW COMPARISON:  Prior today FINDINGS: Moderate right apical pneumothorax  shows mild increase in size since earlier study today. Mild pneumomediastinum is unchanged. Low lung volumes and bibasilar atelectasis show no significant change. Heart size is stable. IMPRESSION: Mild increase in size of moderate right apical pneumothorax. Stable mild pneumomediastinum. Stable low lung volumes and bibasilar atelectasis. Electronically Signed   By: Danae Orleans M.D.   On: 04/24/2021 13:09   DG CHEST PORT 1 VIEW  Result Date: 04/24/2021 CLINICAL DATA:  Postoperative status. EXAM: PORTABLE CHEST 1 VIEW COMPARISON:  April 23, 2021. FINDINGS: Stable cardiomediastinal silhouette. Stable moderate size right apical pneumothorax. Stable left basilar subsegmental atelectasis. Bony thorax is unremarkable. IMPRESSION: Stable moderate right apical pneumothorax. Stable left basilar subsegmental atelectasis. Electronically Signed   By: Lupita Raider M.D.   On: 04/24/2021 08:56   DG CHEST PORT 1 VIEW  Result Date: 04/23/2021 CLINICAL DATA:  Posterior para of  parasagittal hernia EXAM: PORTABLE CHEST 1 VIEW COMPARISON:  04/21/2021 FINDINGS: Moderate right apical pneumothorax. No evidence of tension. Increased interstitial prominence. Bibasilar atelectasis. No substantial pleural effusion. Stable cardiomediastinal contours. Retrocardiac hernia is not identified. IMPRESSION: Moderate right apical pneumothorax. Increased interstitial prominence may reflect edema. Bibasilar atelectasis. These results Jahlen Bollman be called to the ordering clinician or representative by the Radiologist Assistant, and communication documented in the PACS or [employer]. Electronically Signed   By: Guadlupe Spanish M.D.   On: 04/23/2021 13:20   DG ESOPHAGUS W SINGLE CM (SOL OR THIN BA)  Result Date: 04/24/2021 CLINICAL DATA:  Post paraesophageal hernia repair and toupet fundoplication EXAM: ESOPHOGRAM/BARIUM SWALLOW TECHNIQUE: Single contrast examination was performed using water soluble Omnipaque 300. FLUOROSCOPY TIME:  Fluoroscopy Time:  18 seconds Radiation Exposure Index (if provided by the fluoroscopic device): 3.6 mGy And to pay fundoplication COMPARISON:  None. FINDINGS: Patient swallowed contrast without difficulty. Normal esophageal contours. There is no hiatal hernia. Narrowing at the gastroesophageal junction with some holdup in passage of contrast. No contrast extravasation. IMPRESSION: No hiatal hernia. Narrowing at the gastroesophageal junction with some holdup in passage of contrast likely reflecting fundoplication and postoperative edema. No evidence of leak. Electronically Signed   By: Guadlupe Spanish M.D.   On: 04/24/2021 09:42    Cardiac Studies   03/06/2021: TTE IMPRESSIONS   1. Left ventricular ejection fraction, by estimation, is 60 to 65%. The  left ventricle has normal function. The left ventricle has no regional  wall motion abnormalities. There is mild concentric left ventricular  hypertrophy. Left ventricular diastolic  parameters are consistent with Grade I  diastolic dysfunction (impaired  relaxation).   2. Right ventricular systolic function is normal. The right ventricular  size is normal. There is normal pulmonary artery systolic pressure. The  estimated right ventricular systolic pressure is 22.2 mmHg.   3. Left atrial size was moderately dilated.   4. The mitral valve is grossly normal. Trivial mitral valve  regurgitation.   5. The aortic valve is tricuspid. Aortic valve regurgitation is not  visualized.   6. The inferior vena cava is normal in size with greater than 50%  respiratory variability, suggesting right atrial pressure of 3 mmHg.   Patient Profile     65 y.o. male with a hx of prior stroke, HTN, HLD, obesity, OSA (untreated, intolerant of mask)  Admitted yesterday for and underwent  Esophagoscopy - Robotic assisted laparoscopy - Paraesophageal hernia repair with ACell mesh buttress -Toupet fundoplication  Out patient Mr. Hahne was referred to Dr. Lalla Brothers Feb for his Afib becoming increasingly symptomatic and historically on flecainide though  stopped with development of RBBB, LAD/conduction system disease. Rhythm strategy planned with idea towards pursuing PVI ablation, though the pre-procedure CT noted a large  hiatal hernia that is adjacent to and compressing part of the left atrium and ablation plans held given would be unable to monitor the temp of the hernia sac during the procedure.  Was recommended evaluation and management of the hernia be done 1st with plans for Tikosyn eventually and likely post surgical repair of his hernia  Assessment & Plan    1.  Paroxysmal atrial fibrillation: CHA2DS2-VASc of 4.  Currently not anticoagulated.  Would restart Xarelto as soon as indicated per surgery.  He remains in atrial fibrillation, though his rates are better controlled.  He is now taking liquids and pills by mouth.  We Nene Aranas thus stop diltiazem IV and switch him to 300 mg p.o. daily.   For questions or updates, please  contact [facility] Please consult [email or web address] for contact info under        Signed, Teagon Kron Jorja Loa, MD  04/25/2021, 8:16 AM

## 2021-04-26 LAB — BASIC METABOLIC PANEL
Anion gap: 6 (ref 5–15)
BUN: 30 mg/dL — ABNORMAL HIGH (ref 8–23)
CO2: 29 mmol/L (ref 22–32)
Calcium: 9 mg/dL (ref 8.9–10.3)
Chloride: 102 mmol/L (ref 98–111)
Creatinine, Ser: 1.18 mg/dL (ref 0.61–1.24)
GFR, Estimated: >60 mL/min (ref >60)
Glucose, Bld: 106 mg/dL — ABNORMAL HIGH (ref 70–99)
Potassium: 4.4 mmol/L (ref 3.5–5.1)
Sodium: 137 mmol/L (ref 135–145)

## 2021-04-26 LAB — CBC
HCT: 22.9 % — ABNORMAL LOW (ref 39.0–52.0)
Hemoglobin: 7.1 g/dL — ABNORMAL LOW (ref 13.0–17.0)
MCH: 27.7 pg (ref 26.0–34.0)
MCHC: 31 g/dL (ref 30.0–36.0)
MCV: 89.5 fL (ref 80.0–100.0)
Platelets: 189 10*3/uL (ref 150–400)
RBC: 2.56 MIL/uL — ABNORMAL LOW (ref 4.22–5.81)
RDW: 13.6 % (ref 11.5–15.5)
WBC: 8.6 10*3/uL (ref 4.0–10.5)
nRBC: 0 % (ref 0.0–0.2)

## 2021-04-26 MED ORDER — DILTIAZEM HCL ER COATED BEADS 180 MG PO CP24
360.0000 mg | ORAL_CAPSULE | Freq: Every day | ORAL | Status: DC
  Administered 2021-04-27: 360 mg via ORAL
  Filled 2021-04-26: qty 2

## 2021-04-26 NOTE — Plan of Care (Signed)

## 2021-04-26 NOTE — Progress Notes (Addendum)
[address]       [place] [address]             [phone]      3 Days Post-Op Procedure(s) (LRB): XI ROBOTIC ASSISTED PARAESOPHAGEAL HERNIA REPAIR WITH FUNDOPLICATION USING ACELL GENTRIX SURGICAL MATRIX (N/A) ESOPHAGOGASTRODUODENOSCOPY (EGD) (N/A) Subjective: Feels weak but tolerating reasonably well in terms of activities Tolerating dysphagia diet currently with minor tightness sensation but no pain/reflux/nausea  Objective: Vital signs in last 24 hours: Temp:  [98.2 F (36.8 C)-99 F (37.2 C)] 98.6 F (37 C) (05/01 0800) Pulse Rate:  [90-105] 90 (05/01 0800) Cardiac Rhythm: Atrial fibrillation (05/01 0800) Resp:  [17-20] 17 (05/01 0800) BP: (101-131)/(53-78) 101/61 (05/01 0800) SpO2:  [90 %-98 %] 90 % (05/01 0800)  Hemodynamic parameters for last 24 hours:    Intake/Output from previous day: 04/30 0701 - 05/01 0700 In: 420.8 [I.V.:420.8] Out: 300 [Urine:300] Intake/Output this shift: No intake/output data recorded.  General appearance: alert, cooperative and no distress Heart: irregularly irregular rhythm Lungs: mildly dim in bases Abdomen: benign Extremities: no edema or calf tenderness Wound: incis healing well, echymosis is stable or slightly improved  Lab Results: Recent Labs    04/25/21 1041 04/26/21 0030  WBC 9.8 8.6  HGB 7.3* 7.1*  HCT 23.5* 22.9*  PLT 177 189   BMET:  Recent Labs    04/25/21 0116 04/26/21 0030  NA 138 137  K 4.4 4.4  CL 102 102  CO2 26 29  GLUCOSE 121* 106*  BUN 35* 30*  CREATININE 1.30* 1.18  CALCIUM 9.1 9.0    PT/INR: No results for input(s): LABPROT, INR in the last 72 hours. ABG    Component Value Date/Time   PHART 7.428 04/21/2021 1400   HCO3 24.2 04/21/2021 1400   O2SAT 97.8 04/21/2021 1400   CBG (last 3)  No results for input(s): GLUCAP in the last 72 hours.  Meds Scheduled Meds: . acetaminophen  1,000 mg Oral Q6H  . diltiazem  300 mg Oral Daily  . ketorolac  30 mg  Intravenous Q6H  . rivaroxaban  20 mg Oral Q supper   Continuous Infusions: PRN Meds:.morphine injection, ondansetron **OR** ondansetron (ZOFRAN) IV  Xrays DG CHEST PORT 1 VIEW  Result Date: 04/25/2021 CLINICAL DATA:  Postop evaluation. EXAM: PORTABLE CHEST 1 VIEW COMPARISON:  April 24, 2021 FINDINGS: The right apical pneumothorax measures 3.6 cm today versus 4.5 cm previously, slightly improved. No left-sided pneumothorax identified. Minimal atelectasis in the bases. The cardiomediastinal silhouette is stable. Continued pneumomediastinum identified. No other changes. IMPRESSION: 1. Mild improvement of the right apical pneumothorax measuring 3.6 cm today versus 4.5 cm previously. 2. Continued pneumomediastinum. 3. No other abnormalities. Electronically Signed   By: Gerome Sam III M.D   On: 04/25/2021 08:59   DG CHEST PORT 1 VIEW  Result Date: 04/24/2021 CLINICAL DATA:  One day postop from hiatal hernia repair. Chest pain. Follow-up pneumothorax. EXAM: PORTABLE CHEST 1 VIEW COMPARISON:  Prior today FINDINGS: Moderate right apical pneumothorax shows mild increase in size since earlier study today. Mild pneumomediastinum is unchanged. Low lung volumes and bibasilar atelectasis show no significant change. Heart size is stable. IMPRESSION: Mild increase in size of moderate right apical pneumothorax. Stable mild pneumomediastinum. Stable low lung volumes and bibasilar atelectasis. Electronically Signed   By: Danae Orleans M.D.   On: 04/24/2021 13:09    Assessment/Plan: S/P Procedure(s) (LRB): XI ROBOTIC ASSISTED PARAESOPHAGEAL HERNIA REPAIR WITH FUNDOPLICATION USING ACELL GENTRIX SURGICAL MATRIX (  N/A) ESOPHAGOGASTRODUODENOSCOPY (EGD) (N/A)   1 Tmax 99, VSS, afib, mostly CVR but some episodes of RVR to 130's, sats ok on RA with rare drop into 80's 2 I/O- good UOP 3 creat in normal range, BUN 30 4 H/H has dropped a little further to 7.1/22.9- may need to consider transfusion as he is right at  borderline, remains on Xarelto , toradol 5 no new CXR- pntx is considered stable over past few days 6 will see what cards wants to do if anything further at this time with Afib 7 will d/w MD    LOS: 3 days    Rowe Clack 04/26/2021  Agree with above. Remains tachycardic in A. fib.  Cardizem increased per cardiology. I will continue to watch hemoglobin.  Shai Rasmussen Keane Scrape

## 2021-04-26 NOTE — Progress Notes (Signed)
Progress Note  Patient Name: Tanner Rich Date of Encounter: 04/26/2021  [facility] Cardiologist: Charlton Haws, MD   Subjective   Up and out of bed.  Feels better today.  Does continue to have weakness.    Inpatient Medications    Scheduled Meds: . acetaminophen  1,000 mg Oral Q6H  . [START ON 04/27/2021] diltiazem  360 mg Oral Daily  . ketorolac  30 mg Intravenous Q6H  . rivaroxaban  20 mg Oral Q supper   Continuous Infusions:  PRN Meds: morphine injection, ondansetron **OR** ondansetron (ZOFRAN) IV   Vital Signs    Vitals:   04/25/21 2300 04/26/21 0353 04/26/21 0800 04/26/21 1035  BP: 115/65 (!) 126/53 101/61 98/73  Pulse: 98 98 90   Resp: 20 17 17    Temp: 98.4 F (36.9 C) 98.2 F (36.8 C) 98.6 F (37 C)   TempSrc: Oral Oral Oral   SpO2: 98% 93% 90%   Weight:      Height:        Intake/Output Summary (Last 24 hours) at 04/26/2021 1049 Last data filed at 04/25/2021 1500 Gross per 24 hour  Intake 420.78 ml  Output --  Net 420.78 ml   Last 3 Weights 04/23/2021 04/21/2021 03/27/2021  Weight (lbs) 198 lb 197 lb 6 oz 205 lb  Weight (kg) 89.812 kg 89.529 kg 92.987 kg      Telemetry    Atrial fibrillation-personally reviewed   ECG    None new  Physical Exam   GEN: Well nourished, well developed, in no acute distress  HEENT: normal  Neck: no JVD, carotid bruits, or masses Cardiac: irregular; no murmurs, rubs, or gallops,no edema  Respiratory:  clear to auscultation bilaterally, normal work of breathing GI: soft, nontender, nondistended, + BS MS: no deformity or atrophy  Skin: warm and dry Neuro:  Strength and sensation are intact Psych: euthymic mood, full affect    Labs    High Sensitivity Troponin:  No results for input(s): TROPONINIHS in the last 720 hours.    Chemistry Recent Labs  Lab 04/21/21 1345 04/24/21 0018 04/25/21 0116 04/26/21 0030  NA 137 136 138 137  K 3.7 4.4 4.4 4.4  CL 106 104 102 102  CO2 23 24 26 29   GLUCOSE 180*  142* 121* 106*  BUN 19 27* 35* 30*  CREATININE 1.24 1.34* 1.30* 1.18  CALCIUM 9.5 8.7* 9.1 9.0  PROT 6.8  --   --   --   ALBUMIN 3.8  --   --   --   AST 21  --   --   --   ALT 10  --   --   --   ALKPHOS 62  --   --   --   BILITOT 1.5*  --   --   --   GFRNONAA >60 58* >60 >60  ANIONGAP 8 8 10 6      Hematology Recent Labs  Lab 04/25/21 0116 04/25/21 1041 04/26/21 0030  WBC 10.9* 9.8 8.6  RBC 2.78* 2.64* 2.56*  HGB 7.8* 7.3* 7.1*  HCT 24.4* 23.5* 22.9*  MCV 87.8 89.0 89.5  MCH 28.1 27.7 27.7  MCHC 32.0 31.1 31.0  RDW 13.4 13.5 13.6  PLT 175 177 189    BNPNo results for input(s): BNP, PROBNP in the last 168 hours.   DDimer No results for input(s): DDIMER in the last 168 hours.   Radiology    DG CHEST PORT 1 VIEW  Result Date: 04/25/2021 CLINICAL  DATA:  Postop evaluation. EXAM: PORTABLE CHEST 1 VIEW COMPARISON:  April 24, 2021 FINDINGS: The right apical pneumothorax measures 3.6 cm today versus 4.5 cm previously, slightly improved. No left-sided pneumothorax identified. Minimal atelectasis in the bases. The cardiomediastinal silhouette is stable. Continued pneumomediastinum identified. No other changes. IMPRESSION: 1. Mild improvement of the right apical pneumothorax measuring 3.6 cm today versus 4.5 cm previously. 2. Continued pneumomediastinum. 3. No other abnormalities. Electronically Signed   By: Gerome Sam III M.D   On: 04/25/2021 08:59   DG CHEST PORT 1 VIEW  Result Date: 04/24/2021 CLINICAL DATA:  One day postop from hiatal hernia repair. Chest pain. Follow-up pneumothorax. EXAM: PORTABLE CHEST 1 VIEW COMPARISON:  Prior today FINDINGS: Moderate right apical pneumothorax shows mild increase in size since earlier study today. Mild pneumomediastinum is unchanged. Low lung volumes and bibasilar atelectasis show no significant change. Heart size is stable. IMPRESSION: Mild increase in size of moderate right apical pneumothorax. Stable mild pneumomediastinum. Stable low  lung volumes and bibasilar atelectasis. Electronically Signed   By: Danae Orleans M.D.   On: 04/24/2021 13:09    Cardiac Studies   03/06/2021: TTE IMPRESSIONS   1. Left ventricular ejection fraction, by estimation, is 60 to 65%. The  left ventricle has normal function. The left ventricle has no regional  wall motion abnormalities. There is mild concentric left ventricular  hypertrophy. Left ventricular diastolic  parameters are consistent with Grade I diastolic dysfunction (impaired  relaxation).   2. Right ventricular systolic function is normal. The right ventricular  size is normal. There is normal pulmonary artery systolic pressure. The  estimated right ventricular systolic pressure is 22.2 mmHg.   3. Left atrial size was moderately dilated.   4. The mitral valve is grossly normal. Trivial mitral valve  regurgitation.   5. The aortic valve is tricuspid. Aortic valve regurgitation is not  visualized.   6. The inferior vena cava is normal in size with greater than 50%  respiratory variability, suggesting right atrial pressure of 3 mmHg.   Patient Profile     65 y.o. male with a hx of prior stroke, HTN, HLD, obesity, OSA (untreated, intolerant of mask)  Admitted yesterday for and underwent  Esophagoscopy - Robotic assisted laparoscopy - Paraesophageal hernia repair with ACell mesh buttress -Toupet fundoplication  Out patient Mr. [name] was referred to Dr. Lalla Brothers Feb for his Afib becoming increasingly symptomatic and historically on flecainide though stopped with development of RBBB, LAD/conduction system disease. Rhythm strategy planned with idea towards pursuing PVI ablation, though the pre-procedure CT noted a large  hiatal hernia that is adjacent to and compressing part of the left atrium and ablation plans held given would be unable to monitor the temp of the hernia sac during the procedure.  Was recommended evaluation and management of the hernia be done 1st with plans for  Tikosyn eventually and likely post surgical repair of his hernia  Assessment & Plan    1.  Persistent atrial fibrillation: Currently on Xarelto for a CHA2DS2-VASc of 4.  He remains tachycardic, up into the 120s in the room.  Gurjot Brisco increase diltiazem to 360 mg.  If this is not easy to control, a TEE and cardioversion may be warranted.  We Lafe Clerk continue with current management through the weekend.     For questions or updates, please contact [facility] Please consult [email or web address] for contact info under        Signed, Gay Rape Jorja Loa, MD  04/26/2021, 10:49 AM

## 2021-04-26 NOTE — Discharge Instructions (Addendum)
Discharge Instructions:  1. You may shower, please wash incisions daily with soap and water and keep dry.  If you wish to cover wounds with dressing you may do so but please keep clean and change daily.  No tub baths or swimming until incisions have completely healed.  If your incisions become red or develop any drainage please call our office at [phone]  2. No Driving until cleared by Dr. Lucilla Lame office and you are no longer using narcotic pain medications  3. Please eat foods that are of applesauce consistency;NO carbonated beverages  4. Fever of 101.5 for at least 24 hours with no source, please contact our office at [phone]  5. Activity- up as tolerated, please walk at least 3 times per day.  Avoid strenuous activity, no lifting, pushing, or pulling with your arms over 8-10 lbs for a minimum of 2 weeks  6. If any questions or concerns arise, please do not hesitate to contact our office at [phone]  [facility] office [phone]  Robot-Assisted Surgery, Care After The following information offers guidance on how to care for yourself after your procedure. Your health care provider may also give you more specific instructions. If you have problems or questions, contact your health care provider. What can I expect after the procedure? After the procedure, it is common to have:  Some pain and aches in the area of your surgical incisions.  Pain when breathing in (inhaling) and coughing.  Tiredness (fatigue).  Trouble sleeping.  Constipation. Follow these instructions at home: Medicines  Take over-the-counter and prescription medicines only as told by your health care provider.  If you were prescribed an antibiotic medicine, take it as told by your health care provider. Do not stop taking the antibiotic even if you start to feel better.  Talk with your health care provider about safe and effective ways to manage pain after your procedure. Pain management should fit your  specific health needs.  Take pain medicine before pain becomes severe. Relieving and controlling your pain will make breathing easier for you.  Ask your health care provider if the medicine prescribed to you requires you to avoid driving or using machinery. Eating and drinking Follow instructions from your health care provider about eating or drinking restrictions. These will vary depending on what procedure you had. Your health care provider may recommend:   soft diet-pudding consistency  Meals that are smaller and more frequent.  Limiting foods that are high in fat and processed sugar, including fried or sweet foods. Incision care  Follow instructions from your health care provider about how to take care of your incisions. Make sure you: ? Wash your hands with soap and water for at least 20 seconds before and after you change your bandage (dressing). If soap and water are not available, use hand sanitizer. ? Change your dressing as told by your health care provider. ? Leave stitches (sutures), skin glue, or adhesive strips in place. These skin closures may need to stay in place for 2 weeks or longer. If adhesive strip edges start to loosen and curl up, you may trim the loose edges. Do not remove adhesive strips completely unless your health care provider tells you to do that.  Check your incision area every day for signs of infection. Check for: ? Redness, swelling, or more pain. ? Fluid or blood. ? Warmth. ? Pus or a bad smell. Activity  Return to your normal activities as told by your health care provider. Ask your health care  provider what activities are safe for you.  No driving till seen by surgeon  Do not lift anything that is heavier than 10 lb (4.5 kg), or the limit that you are told, until your health care provider says that it is safe.  Rest as told by your health care provider.  Avoid sitting for a long time without moving. Get up to take short walks every 1-2 hours.  This is important to improve blood flow and breathing. Ask for help if you feel weak or unsteady.  Do exercises as told by your health care provider. Pneumonia prevention  Do deep breathing exercises and cough regularly as directed. This helps clear mucus and opens your lungs. Doing this helps prevent lung infection (pneumonia).  If you were given an incentive spirometer, use it as told. An incentive spirometer is a tool that measures how well you are filling your lungs with each breath.  Coughing may hurt less if you try to support your chest. This is called splinting. Try one of these when you cough: ? Hold a pillow against your chest. ? Place the palms of both hands on top of your incision area.  Do not use any products that contain nicotine or tobacco. These products include cigarettes, chewing tobacco, and vaping devices, such as e-cigarettes. If you need help quitting, ask your health care provider.  Avoid secondhand smoke.   General instructions   You may need to take these actions to prevent or treat constipation: ? Drink enough fluid to keep your urine pale yellow. ? Take over-the-counter or prescription medicines. ? Eat foods that are high in fiber, such as beans, whole grains, and fresh fruits and vegetables. ? Limit foods that are high in fat and processed sugars, such as fried or sweet foods.  Keep all follow-up visits. This is important. Contact a health care provider if:  You have redness, swelling, or more pain around an incision.  You have fluid or blood coming from an incision.  An incision feels warm to the touch.  You have pus or a bad smell coming from an incision.  You have a fever.  You cannot eat or drink without vomiting.  Your pain medicine is not controlling your pain. Get help right away if:  You have chest pain.  Your heart is beating quickly.  You have trouble breathing.  You have trouble speaking.  You are confused.  You feel weak or  dizzy, or you faint. These symptoms may represent a serious problem that is an emergency. Do not wait to see if the symptoms will go away. Get medical help right away. Call your local emergency services (911 in the [place] Do not drive yourself to the hospital. Summary  Talk with your health care provider about safe and effective ways to manage pain after your procedure. Pain management should fit your specific health needs.  Return to your normal activities as told by your health care provider. Ask your health care provider what activities are safe for you.  Do deep breathing exercises and cough regularly as directed. This helps to clear mucus and prevent pneumonia. If it hurts to cough, ease pain by holding a pillow against your chest or by placing the palms of both hands over your incisions. This information is not intended to replace advice given to you by your health care provider. Make sure you discuss any questions you have with your health care provider. Document Revised: 09/05/2020 Document Reviewed: 09/05/2020 Elsevier Patient Education  2021  [employer]   Information on my medicine - XARELTO (Rivaroxaban)  Why was Xarelto prescribed for you? Xarelto was prescribed for you to reduce the risk of a blood clot forming that can cause a stroke if you have a medical condition called atrial fibrillation (a type of irregular heartbeat).  What do you need to know about xarelto ? Take your Xarelto ONCE DAILY at the same time every day with your evening meal. If you have difficulty swallowing the tablet whole, you may crush it and mix in applesauce just prior to taking your dose.  Take Xarelto exactly as prescribed by your doctor and DO NOT stop taking Xarelto without talking to the doctor who prescribed the medication.  Stopping without other stroke prevention medication to take the place of Xarelto may increase your risk of developing a clot that causes a stroke.  Refill your  prescription before you run out.  After discharge, you should have regular check-up appointments with your healthcare provider that is prescribing your Xarelto.  In the future your dose may need to be changed if your kidney function or weight changes by a significant amount.  What do you do if you miss a dose? If you are taking Xarelto ONCE DAILY and you miss a dose, take it as soon as you remember on the same day then continue your regularly scheduled once daily regimen the next day. Do not take two doses of Xarelto at the same time or on the same day.   Important Safety Information A possible side effect of Xarelto is bleeding. You should call your healthcare provider right away if you experience any of the following: ? Bleeding from an injury or your nose that does not stop. ? Unusual colored urine (red or dark brown) or unusual colored stools (red or black). ? Unusual bruising for unknown reasons. ? A serious fall or if you hit your head (even if there is no bleeding).  Some medicines may interact with Xarelto and might increase your risk of bleeding while on Xarelto. To help avoid this, consult your healthcare provider or pharmacist prior to using any new prescription or non-prescription medications, including herbals, vitamins, non-steroidal anti-inflammatory drugs (NSAIDs) and supplements.  This website has more information on Xarelto: VisitDestination.com.br.

## 2021-04-27 DIAGNOSIS — I4819 Other persistent atrial fibrillation: Secondary | ICD-10-CM

## 2021-04-27 LAB — CBC
HCT: 22.9 % — ABNORMAL LOW (ref 39.0–52.0)
Hemoglobin: 7.1 g/dL — ABNORMAL LOW (ref 13.0–17.0)
MCH: 27.6 pg (ref 26.0–34.0)
MCHC: 31 g/dL (ref 30.0–36.0)
MCV: 89.1 fL (ref 80.0–100.0)
Platelets: 200 10*3/uL (ref 150–400)
RBC: 2.57 MIL/uL — ABNORMAL LOW (ref 4.22–5.81)
RDW: 13.9 % (ref 11.5–15.5)
WBC: 6.6 10*3/uL (ref 4.0–10.5)
nRBC: 0.3 % — ABNORMAL HIGH (ref 0.0–0.2)

## 2021-04-27 MED ORDER — ACETAMINOPHEN 500 MG PO TABS: 1000.0000 mg | ORAL_TABLET | Freq: Four times a day (QID) | ORAL | 0 refills | Status: AC

## 2021-04-27 MED ORDER — DILTIAZEM HCL ER COATED BEADS 360 MG PO CP24: 360.0000 mg | ORAL_CAPSULE | Freq: Every day | ORAL | 1 refills | Status: DC

## 2021-04-27 MED ORDER — TRAMADOL HCL 50 MG PO TABS: 50.0000 mg | ORAL_TABLET | Freq: Four times a day (QID) | ORAL | 0 refills | Status: DC | PRN

## 2021-04-27 NOTE — Care Management Important Message (Signed)
Important Message  Patient Details  Name: Tanner Rich MRN: [id number] Date of Birth: 12/10/1956   Medicare Important Message Given:  Yes     Dorena Bodo 04/27/2021, 3:28 PM

## 2021-04-27 NOTE — Progress Notes (Addendum)
      [address]       [place] [address]             [phone]       4 Days Post-Op Procedure(s) (LRB): XI ROBOTIC ASSISTED PARAESOPHAGEAL HERNIA REPAIR WITH FUNDOPLICATION USING ACELL GENTRIX SURGICAL MATRIX (N/A) ESOPHAGOGASTRODUODENOSCOPY (EGD) (N/A)  Subjective: Patient without specific complaint this am  Objective: Vital signs in last 24 hours: Temp:  [97.9 F (36.6 C)-98.6 F (37 C)] 98.1 F (36.7 C) (05/01 2351) Pulse Rate:  [82-91] 83 (05/01 2351) Cardiac Rhythm: Atrial fibrillation (05/02 0333) Resp:  [17-19] 18 (05/01 2351) BP: (98-117)/(61-76) 100/64 (05/01 2351) SpO2:  [90 %-94 %] 91 % (05/01 2351)     Intake/Output from previous day: No intake/output data recorded.   Physical Exam:  Cardiovascular: IRRR IRRR Pulmonary: Clear to auscultation bilaterally Abdomen: Soft, non tender, bowel sounds present. Extremities: No lower extremity edema. Wounds: Clean and dry.  No erythema or signs of infection.   Lab Results: XNA:TFTDDU Labs    04/26/21 0030 04/27/21 0055  WBC 8.6 6.6  HGB 7.1* 7.1*  HCT 22.9* 22.9*  PLT 189 200   BMET:  Recent Labs    04/25/21 0116 04/26/21 0030  NA 138 137  K 4.4 4.4  CL 102 102  CO2 26 29  GLUCOSE 121* 106*  BUN 35* 30*  CREATININE 1.30* 1.18  CALCIUM 9.1 9.0    PT/INR: No results for input(s): LABPROT, INR in the last 72 hours. ABG:  INR: Will add last result for INR, ABG once components are confirmed Will add last 4 CBG results once components are confirmed  Assessment/Plan:  1. CV - A fib with HR in the 90's this am. On Diltiazem  360 mg daily and Rivaroxaban 20 mg at night. Hopefully, can avoid TEE/DCCV 2.  Pulmonary - On room air. Encourage incentive spirometer 3. Anemia-H and H this am stable at 7.1 and 22.9. As discussed with Dr. Cliffton Asters, will not transfuse as HCT has remained less than 24 post op and not symptomatic 4. As discussed with EP, ok to discharge.  Mayur Duman M  ZimmermanPA-C 04/27/2021,7:00 AM [phone]

## 2021-04-27 NOTE — Progress Notes (Signed)
Electrophysiology Rounding Note  Patient Name: Tanner Rich Date of Encounter: 04/27/2021  Primary Cardiologist: Charlton Haws, MD Electrophysiologist: Lanier Prude, MD   Subjective   The patient is doing well today.  Denies complaint. Rates 80-90s on increased diltiazem.  Inpatient Medications    Scheduled Meds: . acetaminophen  1,000 mg Oral Q6H  . diltiazem  360 mg Oral Daily  . ketorolac  30 mg Intravenous Q6H  . rivaroxaban  20 mg Oral Q supper   Continuous Infusions:  PRN Meds: morphine injection, ondansetron **OR** ondansetron (ZOFRAN) IV   Vital Signs    Vitals:   04/26/21 1300 04/26/21 1303 04/26/21 1927 04/26/21 2351  BP:  117/76 110/76 100/64  Pulse: 89 91 82 83  Resp: 19  18 18   Temp: 97.9 F (36.6 C)  98.2 F (36.8 C) 98.1 F (36.7 C)  TempSrc: Oral  Oral Oral  SpO2:   94% 91%  Weight:      Height:       No intake or output data in the 24 hours ending 04/27/21 0805 Filed Weights   04/23/21 0555  Weight: 89.8 kg    Physical Exam    GEN- The patient is well appearing, alert and oriented x 3 today.   Head- normocephalic, atraumatic Eyes-  Sclera clear, conjunctiva pink Ears- hearing intact Oropharynx- clear Neck- supple Lungs- Clear to ausculation bilaterally, normal work of breathing Heart- Irregularly irregular rate and rhythm, no murmurs, rubs or gallops GI- soft, NT, ND, + BS Extremities- no clubbing or cyanosis. No edema Skin- no rash or lesion Psych- euthymic mood, full affect Neuro- strength and sensation are intact  Labs    CBC Recent Labs    04/26/21 0030 04/27/21 0055  WBC 8.6 6.6  HGB 7.1* 7.1*  HCT 22.9* 22.9*  MCV 89.5 89.1  PLT 189 200   Basic Metabolic Panel Recent Labs    06/27/21 0116 04/26/21 0030  NA 138 137  K 4.4 4.4  CL 102 102  CO2 26 29  GLUCOSE 121* 106*  BUN 35* 30*  CREATININE 1.30* 1.18  CALCIUM 9.1 9.0   Liver Function Tests No results for input(s): AST, ALT, ALKPHOS, BILITOT,  PROT, ALBUMIN in the last 72 hours. No results for input(s): LIPASE, AMYLASE in the last 72 hours. Cardiac Enzymes No results for input(s): CKTOTAL, CKMB, CKMBINDEX, TROPONINI in the last 72 hours.   Telemetry    AF 80-90s (personally reviewed)  Radiology    No results found.  Patient Profile     65 y.o. male with a hx of prior stroke, HTN, HLD, obesity, OSA (untreated, intolerant of mask)  Admitted yesterday for and underwent  Esophagoscopy - Robotic assisted laparoscopy - Paraesophageal hernia repairwith ACell mesh buttress -Toupetfundoplication  Out patient TannerTysorwas referred to Dr. 79 Feb for his Afib becoming increasingly symptomatic and historically on flecainide though stopped with development of RBBB, LAD/conduction system disease. Rhythm strategy planned with idea towards pursuing PVI ablation, though the pre-procedure CT noted a largehiatal hernia that is adjacent to and compressing part of the left atriumand ablation plans held given would be unable to monitor the temp of the hernia sac during the procedure. Was recommended evaluation and management of the hernia be done 1st with plans for Tikosyn eventually and likely post surgical repair of his hernia  Assessment & Plan    1. Persistent atrial fibrillation Continue xarelto for CHA2DS2VASC of at least 4.  Continue diltiazem 360 mg daily DO NOT resume  flecainide; was previously stopped with RBBB and LAFB Will make follow up in AF clinic to work through getting tikosyn started (will need to wait to start until back on Xarelto x 3 weeks at least)  For questions or updates, please contact [facility] Please consult [email or web address] for contact info under Cardiology/STEMI.  Signed, Graciella Freer, PA-C  04/27/2021, 8:05 AM

## 2021-04-27 NOTE — Care Management Important Message (Signed)
Important Message  Patient Details  Name: Tanner Rich MRN: [id number] Date of Birth: 07/14/1956   Medicare Important Message Given:  Yes  Patient left prior to IM delivery IM will be mailed to the patient home address.   [name] 04/27/2021, 3:29 PM

## 2021-04-29 ENCOUNTER — Ambulatory Visit: Payer: Medicare Other | Admitting: Thoracic Surgery (Cardiothoracic Vascular Surgery)

## 2021-04-30 ENCOUNTER — Ambulatory Visit (HOSPITAL_COMMUNITY): Payer: Medicare Other | Admitting: Nurse Practitioner

## 2021-05-07 ENCOUNTER — Encounter (HOSPITAL_COMMUNITY): Payer: Self-pay | Admitting: Nurse Practitioner

## 2021-05-07 ENCOUNTER — Other Ambulatory Visit: Payer: Self-pay

## 2021-05-07 ENCOUNTER — Ambulatory Visit (HOSPITAL_COMMUNITY)
Admit: 2021-05-07 | Discharge: 2021-05-07 | Disposition: A | Discharge status: Home / Self Care | Payer: Medicare Other | Attending: Nurse Practitioner | Admitting: Nurse Practitioner

## 2021-05-07 VITALS — BP 114/88 | HR 97 | Ht 67.0 in | Wt 194.2 lb

## 2021-05-07 DIAGNOSIS — I4819 Other persistent atrial fibrillation: Secondary | ICD-10-CM | POA: Diagnosis present

## 2021-05-07 DIAGNOSIS — I480 Paroxysmal atrial fibrillation: Secondary | ICD-10-CM | POA: Diagnosis not present

## 2021-05-07 DIAGNOSIS — Z6830 Body mass index (BMI) 30.0-30.9, adult: Secondary | ICD-10-CM | POA: Diagnosis not present

## 2021-05-07 DIAGNOSIS — E785 Hyperlipidemia, unspecified: Secondary | ICD-10-CM | POA: Diagnosis not present

## 2021-05-07 DIAGNOSIS — Z79899 Other long term (current) drug therapy: Secondary | ICD-10-CM | POA: Insufficient documentation

## 2021-05-07 DIAGNOSIS — D6869 Other thrombophilia: Secondary | ICD-10-CM

## 2021-05-07 DIAGNOSIS — Z7901 Long term (current) use of anticoagulants: Secondary | ICD-10-CM | POA: Diagnosis not present

## 2021-05-07 DIAGNOSIS — E669 Obesity, unspecified: Secondary | ICD-10-CM | POA: Diagnosis not present

## 2021-05-07 DIAGNOSIS — I119 Hypertensive heart disease without heart failure: Secondary | ICD-10-CM | POA: Diagnosis not present

## 2021-05-07 LAB — CBC
HCT: 33.7 % — ABNORMAL LOW (ref 39.0–52.0)
Hemoglobin: 10 g/dL — ABNORMAL LOW (ref 13.0–17.0)
MCH: 27.9 pg (ref 26.0–34.0)
MCHC: 29.7 g/dL — ABNORMAL LOW (ref 30.0–36.0)
MCV: 93.9 fL (ref 80.0–100.0)
Platelets: 491 10*3/uL — ABNORMAL HIGH (ref 150–400)
RBC: 3.59 MIL/uL — ABNORMAL LOW (ref 4.22–5.81)
RDW: 15.6 % — ABNORMAL HIGH (ref 11.5–15.5)
WBC: 8.3 10*3/uL (ref 4.0–10.5)
nRBC: 0 % (ref 0.0–0.2)

## 2021-05-07 LAB — BASIC METABOLIC PANEL
Anion gap: 8 (ref 5–15)
BUN: 15 mg/dL (ref 8–23)
CO2: 25 mmol/L (ref 22–32)
Calcium: 9.5 mg/dL (ref 8.9–10.3)
Chloride: 104 mmol/L (ref 98–111)
Creatinine, Ser: 1.25 mg/dL — ABNORMAL HIGH (ref 0.61–1.24)
GFR, Estimated: >60 mL/min (ref >60)
Glucose, Bld: 102 mg/dL — ABNORMAL HIGH (ref 70–99)
Potassium: 4.3 mmol/L (ref 3.5–5.1)
Sodium: 137 mmol/L (ref 135–145)

## 2021-05-07 LAB — MAGNESIUM: Magnesium: 2.2 mg/dL (ref 1.7–2.4)

## 2021-05-07 NOTE — Progress Notes (Signed)
Primary Care Physician: Marcell Anger, NP Referring Physician: Dr. Brooke Pace is a 65 y.o. male with a h/o persistent  afib that failed flecainide and was referred to Dr. Lalla Brothers 01/2021,  to be considered for ablation. The imaging needed for ablation revealed a large Hiatal hernia. Ablation plans were  discontinued  due to safety concerns of burning  left atrium with HH comprising  Left atrium. Flecainide was discontinued. The original plan was to have tikosyn admit then [facility] repair but the timing did not work out that way. He is now around 2 weeks s/p hernia repair. He is here to discuss Tikosyn admit. He restarted anticoagulation 4/29, after  missing one day pre op.   No benadryl use. Qtc acceptable. Discussed price of drug. He plans to use good rx. He is rate controlled but  is bothered with fatigue and she shortness of  breath with exertion. He was seen by EP during his recent surgery and diltiazem was increased to 360 mg daily.  I did discuss with Dr. Lalla Brothers  going ahead with tikosyn admit vrs ablation and he wants him to go ahead with tikosyn admit for now but he will ablate down the road when he has fully  recovered from his surgery.   Today, he denies symptoms of palpitations, chest pain, shortness of breath, orthopnea, PND, lower extremity edema, dizziness, presyncope, syncope, or neurologic sequela. The patient is tolerating medications without difficulties and is otherwise without complaint today.   Past Medical History:  Diagnosis Date  . A-fib (HCC)   . Complication of anesthesia    "waking up, throat closed up"  . CVA (cerebral vascular accident) (HCC) 2000  . GERD (gastroesophageal reflux disease)   . History of kidney stones   . HLD (hyperlipidemia)   . HTN (hypertension)   . Obesity   . OSA (obstructive sleep apnea)   . OSA (obstructive sleep apnea) 05/07/2015  . Right carotid artery occlusion    Chronic RICA occlusion, no stenosis LICA on 01/26/21 carotid  Duplex   Past Surgical History:  Procedure Laterality Date  . CARDIOVERSION N/A 06/03/2015   Procedure: CARDIOVERSION;  Surgeon: Wendall Stade, MD;  Location: [facility] ENDOSCOPY;  Service: Cardiovascular;  Laterality: N/A;  . CARDIOVERSION N/A 05/11/2017   Procedure: CARDIOVERSION;  Surgeon: Wendall Stade, MD;  Location: [facility] ENDOSCOPY;  Service: Cardiovascular;  Laterality: N/A;  . CARDIOVERSION N/A 04/11/2020   Procedure: CARDIOVERSION;  Surgeon: Pricilla Riffle, MD;  Location: [facility] ENDOSCOPY;  Service: Cardiovascular;  Laterality: N/A;  . COLONOSCOPY    . ESOPHAGOGASTRODUODENOSCOPY N/A 04/23/2021   Procedure: ESOPHAGOGASTRODUODENOSCOPY (EGD);  Surgeon: Corliss Skains, MD;  Location: [facility] OR;  Service: Thoracic;  Laterality: N/A;  . TONSILLECTOMY    . XI ROBOTIC ASSISTED PARAESOPHAGEAL HERNIA REPAIR N/A 04/23/2021   Procedure: XI ROBOTIC ASSISTED PARAESOPHAGEAL HERNIA REPAIR WITH FUNDOPLICATION USING ACELL GENTRIX SURGICAL MATRIX;  Surgeon: Corliss Skains, MD;  Location: [facility] OR;  Service: Thoracic;  Laterality: N/A;    Current Outpatient Medications  Medication Sig Dispense Refill  . acetaminophen (TYLENOL) 500 MG tablet Take 2 tablets (1,000 mg total) by mouth every 6 (six) hours. 30 tablet 0  . diltiazem (CARDIZEM CD) 360 MG 24 hr capsule Take 1 capsule (360 mg total) by mouth daily. 30 capsule 1  . esomeprazole (NEXIUM) 20 MG packet Take 20 mg by mouth daily before breakfast.    . rivaroxaban (XARELTO) 20 MG TABS tablet Take 20 mg by  mouth daily.    . tamsulosin (FLOMAX) 0.4 MG CAPS capsule Take 0.4 mg by mouth daily.    . Vitamin D, Ergocalciferol, (DRISDOL) 1.25 MG (50000 UNIT) CAPS capsule Take 50,000 Units by mouth every 30 (thirty) days.     No current facility-administered medications for this encounter.    Allergies  Allergen Reactions  . Ciprofloxacin Swelling    Pt states he took med for 3 days and ankle swelling started.      Social History   Socioeconomic History  .  Marital status: Married    Spouse name: Not on file  . Number of children: Not on file  . Years of education: Not on file  . Highest education level: Not on file  Occupational History  . Not on file  Tobacco Use  . Smoking status: Never Smoker  . Smokeless tobacco: Never Used  Vaping Use  . Vaping Use: Never used  Substance and Sexual Activity  . Alcohol use: No    Alcohol/week: 0.0 standard drinks  . Drug use: No  . Sexual activity: Not on file  Other Topics Concern  . Not on file  Social History Narrative  . Not on file   Social Determinants of Health   Financial Resource Strain: Not on file  Food Insecurity: Not on file  Transportation Needs: Not on file  Physical Activity: Not on file  Stress: Not on file  Social Connections: Not on file  Intimate Partner Violence: Not on file    Family History  Problem Relation Age of Onset  . Atrial fibrillation Mother   . Cancer Father        BONE  . Heart disease Father   . Prostate cancer Other     ROS- All systems are reviewed and negative except as per the HPI above  Physical Exam: Vitals:   05/07/21 1115  BP: 114/88  Pulse: 97  Weight: 88.1 kg  Height: 5\' 7"  (1.702 m)   Wt Readings from Last 3 Encounters:  05/07/21 88.1 kg  04/23/21 89.8 kg  04/21/21 89.5 kg    Labs: Lab Results  Component Value Date   NA 137 04/26/2021   K 4.4 04/26/2021   CL 102 04/26/2021   CO2 29 04/26/2021   GLUCOSE 106 (H) 04/26/2021   BUN 30 (H) 04/26/2021   CREATININE 1.18 04/26/2021   CALCIUM 9.0 04/26/2021   Lab Results  Component Value Date   INR 1.2 04/21/2021   No results found for: CHOL, HDL, LDLCALC, TRIG   GEN- The patient is well appearing, alert and oriented x 3 today.   Head- normocephalic, atraumatic Eyes-  Sclera clear, conjunctiva pink Ears- hearing intact Oropharynx- clear Neck- supple, no JVP Lymph- no cervical lymphadenopathy Lungs- Clear to ausculation bilaterally, normal work of  breathing Heart- irregular rate and rhythm, no murmurs, rubs or gallops, PMI not laterally displaced GI- soft, NT, ND, + BS Extremities- no clubbing, cyanosis, or edema MS- no significant deformity or atrophy Skin- no rash or lesion Psych- euthymic mood, full affect Neuro- strength and sensation are intact  EKG-afib at 97 bpm, qrs int     Assessment and Plan: 1. Persistent afib Ablation was cancelled due to large hiatal hernia and he is here now s/p HH surgery 2 weeks ago and to discuss Tikosyn admit He is not using benadryl He has been back on anticoagulation since 4/29 qtc is acceptable  He plans to use [facility] Cbc/bmet/mag today for baseline  Continue diltiazem 360  mg for rate control   2. CHA2DS2VASc score of at least 4 Continue  xarelto 20 mg daily without any missed doses   We gave him 3 dates to pick for elective Tikosyn admission and he will be in touch to confirm date.   Elvina Sidle Matthew Folks [facility] [address] [place], [place] [address] [phone]   3.

## 2021-05-08 ENCOUNTER — Telehealth (INDEPENDENT_AMBULATORY_CARE_PROVIDER_SITE_OTHER): Payer: Self-pay | Admitting: Thoracic Surgery (Cardiothoracic Vascular Surgery)

## 2021-05-08 ENCOUNTER — Encounter: Payer: Self-pay | Admitting: Thoracic Surgery (Cardiothoracic Vascular Surgery)

## 2021-05-08 VITALS — BP 132/82 | HR 77 | Resp 20 | Ht 67.0 in | Wt 194.0 lb

## 2021-05-08 DIAGNOSIS — K449 Diaphragmatic hernia without obstruction or gangrene: Secondary | ICD-10-CM

## 2021-05-08 DIAGNOSIS — Z09 Encounter for follow-up examination after completed treatment for conditions other than malignant neoplasm: Secondary | ICD-10-CM

## 2021-05-08 NOTE — Progress Notes (Signed)
      [address]       [place] [address]             [phone]        FAIZAN GERACI [facility] Medical Record #[id number] Date of Birth: 02/12/1956  Referring: Lanier Prude, MD Primary Care: Marcell Anger, NP Primary Cardiologist:Peter Eden Emms, MD  Reason for visit:   follow-up  History of Present Illness:     Patient presents for his follow-up.  Overall he is doing well.  He denies any dysphagia.  He has been off of his omeprazole and has not had any reflux.  Physical Exam: BP 132/82 (BP Location: Right Arm, Patient Position: Sitting)   Pulse 77   Resp 20   Ht 5\' 7"  (1.702 m)   Wt 194 lb (88 kg)   SpO2 97% Comment: RA  BMI 30.38 kg/m   Alert NAD Incision clean.   Abdomen soft, ND No peripheral edema       Assessment / Plan:   65 year old male status post robotic assisted paraesophageal hernia repair with toupee fundoplication.  He also has a history of chronic atrial fibrillation.  He is rate controlled today.  From a digestive standpoint he can start advancing his diet.  I will see him back with a chest x-ray.   79 05/08/2021 4:12 PM

## 2021-05-18 ENCOUNTER — Other Ambulatory Visit (HOSPITAL_COMMUNITY)
Admission: RE | Admit: 2021-05-18 | Discharge: 2021-05-18 | Disposition: A | Discharge status: Home / Self Care | Payer: Medicare Other | Source: Ambulatory Visit | Attending: Nurse Practitioner | Admitting: Nurse Practitioner

## 2021-05-18 DIAGNOSIS — Z20822 Contact with and (suspected) exposure to covid-19: Secondary | ICD-10-CM | POA: Insufficient documentation

## 2021-05-18 DIAGNOSIS — Z01812 Encounter for preprocedural laboratory examination: Secondary | ICD-10-CM | POA: Insufficient documentation

## 2021-05-18 LAB — SARS CORONAVIRUS 2 (TAT 6-24 HRS): SARS Coronavirus 2: NEGATIVE

## 2021-05-19 ENCOUNTER — Inpatient Hospital Stay (HOSPITAL_COMMUNITY)
Admission: AD | Admit: 2021-05-19 | Discharge: 2021-05-22 | DRG: 310 | Disposition: A | Discharge status: Home / Self Care | Payer: Medicare Other | Attending: Cardiology | Admitting: Cardiology

## 2021-05-19 ENCOUNTER — Encounter (HOSPITAL_COMMUNITY): Payer: Self-pay | Admitting: Internal Medicine

## 2021-05-19 ENCOUNTER — Ambulatory Visit (HOSPITAL_COMMUNITY)
Admission: RE | Admit: 2021-05-19 | Discharge: 2021-05-19 | Disposition: A | Discharge status: Home / Self Care | Payer: Medicare Other | Source: Ambulatory Visit | Attending: Nurse Practitioner | Admitting: Nurse Practitioner

## 2021-05-19 ENCOUNTER — Other Ambulatory Visit: Payer: Self-pay | Admitting: Physician Assistant

## 2021-05-19 ENCOUNTER — Encounter (HOSPITAL_COMMUNITY): Payer: Self-pay | Admitting: Physician Assistant

## 2021-05-19 ENCOUNTER — Other Ambulatory Visit: Payer: Self-pay

## 2021-05-19 VITALS — BP 134/82 | HR 83 | Ht 67.0 in | Wt 190.0 lb

## 2021-05-19 DIAGNOSIS — Z8249 Family history of ischemic heart disease and other diseases of the circulatory system: Secondary | ICD-10-CM | POA: Diagnosis not present

## 2021-05-19 DIAGNOSIS — K219 Gastro-esophageal reflux disease without esophagitis: Secondary | ICD-10-CM | POA: Diagnosis present

## 2021-05-19 DIAGNOSIS — Z8673 Personal history of transient ischemic attack (TIA), and cerebral infarction without residual deficits: Secondary | ICD-10-CM

## 2021-05-19 DIAGNOSIS — I10 Essential (primary) hypertension: Secondary | ICD-10-CM | POA: Diagnosis present

## 2021-05-19 DIAGNOSIS — Z8042 Family history of malignant neoplasm of prostate: Secondary | ICD-10-CM

## 2021-05-19 DIAGNOSIS — I4819 Other persistent atrial fibrillation: Secondary | ICD-10-CM | POA: Diagnosis present

## 2021-05-19 DIAGNOSIS — Z79899 Other long term (current) drug therapy: Secondary | ICD-10-CM | POA: Diagnosis not present

## 2021-05-19 DIAGNOSIS — Z881 Allergy status to other antibiotic agents status: Secondary | ICD-10-CM | POA: Diagnosis not present

## 2021-05-19 DIAGNOSIS — D6869 Other thrombophilia: Secondary | ICD-10-CM | POA: Insufficient documentation

## 2021-05-19 DIAGNOSIS — Z87442 Personal history of urinary calculi: Secondary | ICD-10-CM

## 2021-05-19 DIAGNOSIS — Z7901 Long term (current) use of anticoagulants: Secondary | ICD-10-CM

## 2021-05-19 DIAGNOSIS — Z20822 Contact with and (suspected) exposure to covid-19: Secondary | ICD-10-CM | POA: Diagnosis present

## 2021-05-19 DIAGNOSIS — G4733 Obstructive sleep apnea (adult) (pediatric): Secondary | ICD-10-CM

## 2021-05-19 DIAGNOSIS — E785 Hyperlipidemia, unspecified: Secondary | ICD-10-CM | POA: Diagnosis present

## 2021-05-19 LAB — BASIC METABOLIC PANEL
Anion gap: 11 (ref 5–15)
BUN: 12 mg/dL (ref 8–23)
CO2: 25 mmol/L (ref 22–32)
Calcium: 9.8 mg/dL (ref 8.9–10.3)
Chloride: 104 mmol/L (ref 98–111)
Creatinine, Ser: 1.08 mg/dL (ref 0.61–1.24)
GFR, Estimated: >60 mL/min (ref >60)
Glucose, Bld: 94 mg/dL (ref 70–99)
Potassium: 4.1 mmol/L (ref 3.5–5.1)
Sodium: 140 mmol/L (ref 135–145)

## 2021-05-19 LAB — HIV ANTIBODY (ROUTINE TESTING W REFLEX): HIV Screen 4th Generation wRfx: NONREACTIVE

## 2021-05-19 LAB — MAGNESIUM: Magnesium: 2.2 mg/dL (ref 1.7–2.4)

## 2021-05-19 MED ORDER — ESOMEPRAZOLE MAGNESIUM 20 MG PO PACK: 20.0000 mg | PACK | Freq: Every day | ORAL | Status: DC

## 2021-05-19 MED ORDER — TAMSULOSIN HCL 0.4 MG PO CAPS
0.4000 mg | ORAL_CAPSULE | Freq: Every day | ORAL | Status: DC
  Administered 2021-05-20 – 2021-05-22 (×3): 0.4 mg via ORAL
  Filled 2021-05-19 (×3): qty 1

## 2021-05-19 MED ORDER — SODIUM CHLORIDE 0.9% FLUSH: 3.0000 mL | INTRAVENOUS | Status: DC | PRN

## 2021-05-19 MED ORDER — SODIUM CHLORIDE 0.9 % IV SOLN: 250.0000 mL | INTRAVENOUS | Status: DC | PRN

## 2021-05-19 MED ORDER — RIVAROXABAN 20 MG PO TABS
20.0000 mg | ORAL_TABLET | Freq: Every day | ORAL | Status: DC
  Administered 2021-05-19 – 2021-05-21 (×3): 20 mg via ORAL
  Filled 2021-05-19 (×3): qty 1

## 2021-05-19 MED ORDER — DILTIAZEM HCL ER COATED BEADS 180 MG PO CP24
360.0000 mg | ORAL_CAPSULE | Freq: Every day | ORAL | Status: DC
  Administered 2021-05-20 – 2021-05-22 (×3): 360 mg via ORAL
  Filled 2021-05-19 (×3): qty 2

## 2021-05-19 MED ORDER — DOFETILIDE 500 MCG PO CAPS
500.0000 ug | ORAL_CAPSULE | Freq: Two times a day (BID) | ORAL | Status: DC
  Administered 2021-05-19 – 2021-05-22 (×6): 500 ug via ORAL
  Filled 2021-05-19 (×6): qty 1

## 2021-05-19 MED ORDER — PANTOPRAZOLE SODIUM 40 MG PO TBEC
40.0000 mg | DELAYED_RELEASE_TABLET | Freq: Every day | ORAL | Status: DC
  Administered 2021-05-20 – 2021-05-22 (×3): 40 mg via ORAL
  Filled 2021-05-19 (×3): qty 1

## 2021-05-19 MED ORDER — SODIUM CHLORIDE 0.9% FLUSH
3.0000 mL | Freq: Two times a day (BID) | INTRAVENOUS | Status: DC
  Administered 2021-05-19 – 2021-05-22 (×6): 3 mL via INTRAVENOUS

## 2021-05-19 NOTE — Progress Notes (Signed)
Pharmacy: Dofetilide (Tikosyn) - Initial Consult Assessment and Electrolyte Replacement  Pharmacy consulted to assist in monitoring and replacing electrolytes in this 65 y.o. male admitted on 05/19/2021 undergoing dofetilide initiation. First dofetilide dose: 05/19/21.  Assessment:  Patient Exclusion Criteria: If any screening criteria checked as "Yes", then  patient  should NOT receive dofetilide until criteria item is corrected.  If "Yes" please indicate correction plan.  YES  NO Patient  Exclusion Criteria Correction Plan   []   [x]   Baseline QTc interval is greater than or equal to 440 msec. IF above YES box checked dofetilide contraindicated unless patient has ICD; then may proceed if QTc 500-550 msec or with known ventricular conduction abnormalities may proceed with QTc 550-600 msec. QTc = 418    []   [x]   Patient is known or suspected to have a digoxin level greater than 2 ng/ml: No results found for: DIGOXIN     []   [x]   Creatinine clearance less than 20 ml/min (calculated using Cockcroft-Gault, actual body weight and serum creatinine): Estimated Creatinine Clearance: 60.1 mL/min (A) (by C-G formula based on SCr of 1.25 mg/dL (H)).     []   [x]  Patient has received drugs known to prolong the QT intervals within the last 48 hours (phenothiazines, tricyclics or tetracyclic antidepressants, erythromycin, H-1 antihistamines, cisapride, fluoroquinolones, azithromycin, ondansetron).   Updated information on QT prolonging agents is available to be searched on the following database:QT prolonging agents     []   [x]   Patient received a dose of hydrochlorothiazide (Oretic) alone or in any combination including triamterene (Dyazide, Maxzide) in the last 48 hours.    []   [x]  Patient received a medication known to increase dofetilide plasma concentrations prior to initial dofetilide dose:  . Trimethoprim (Primsol, Proloprim) in the last 36 hours . Verapamil (Calan, Verelan) in the  last 36 hours or a sustained release dose in the last 72 hours . Megestrol (Megace) in the last 5 days  . Cimetidine (Tagamet) in the last 6 hours . Ketoconazole (Nizoral) in the last 24 hours . Itraconazole (Sporanox) in the last 48 hours  . Prochlorperazine (Compazine) in the last 36 hours     []   [x]   Patient is known to have a history of torsades de pointes; congenital or acquired long QT syndromes.    []   [x]   Patient has received a Class 1 antiarrhythmic with less than 2 half-lives since last dose. (Disopyramide, Quinidine, Procainamide, Lidocaine, Mexiletine, Flecainide, Propafenone)    []   [x]   Patient has received amiodarone therapy in the past 3 months or amiodarone level is greater than 0.3 ng/ml.    Patient has been appropriately anticoagulated with Xarelto 20mg /d x3 weeks.  Labs:    Component Value Date/Time   K 4.1 05/19/2021 1224   MG 2.2 05/19/2021 1224     Plan: Potassium: K >/= 4: Appropriate to initiate Tikosyn, no replacement needed    Magnesium: Mg >2: Appropriate to initiate Tikosyn, no replacement needed     Thank you for allowing pharmacy to participate in this patient's care   , PharmD, BCPS, [facility] Clinical Pharmacist [phone] Please check AMION for all [facility] Pharm[facility] numbers 05/19/2021

## 2021-05-19 NOTE — Progress Notes (Addendum)
Primary Care Physician: Marcell Anger, NP Referring Physician: Dr. Brooke Pace is a 65 y.o. male with a h/o persistent  afib that failed flecainide and was referred to Dr. Lalla Brothers 01/2021,  to be considered for ablation. The imaging needed for ablation revealed a large Hiatal hernia. Ablation plans were  discontinued  due to safety concerns of burning  left atrium with HH comprising  Left atrium. Flecainide was discontinued. The original plan was to have tikosyn admit then [facility] repair but the timing did not work out that way. He is now around 2 weeks s/p hernia repair. He is here to discuss Tikosyn admit. He restarted anticoagulation 4/29, after  missing one day pre op.   No benadryl use. Qtc acceptable. Discussed price of drug. He plans to use good rx. He is rate controlled but  is bothered with fatigue and she shortness of  breath with exertion. He was seen by EP during his recent surgery and diltiazem was increased to 360 mg daily.  I did discuss with Dr. Lalla Brothers  going ahead with tikosyn admit vrs ablation and he wants him to go ahead with tikosyn admit for now but he will ablate down the road when he has fully  recovered from his surgery.  Follow up 05/19/21 in the AF clinic. Patient presents for dofetilide admission. He continues to have SOB with exertion. He denies any bleeding issues on anticoagulation and has not missed any doses in the last 3 weeks.    Today, he denies symptoms of palpitations, chest pain, orthopnea, PND, lower extremity edema, dizziness, presyncope, syncope, or neurologic sequela. The patient is tolerating medications without difficulties and is otherwise without complaint today.   Past Medical History:  Diagnosis Date  . A-fib (HCC)   . Complication of anesthesia    "waking up, throat closed up"  . CVA (cerebral vascular accident) (HCC) 2000  . GERD (gastroesophageal reflux disease)   . History of kidney stones   . HLD (hyperlipidemia)   . HTN  (hypertension)   . Obesity   . OSA (obstructive sleep apnea)   . OSA (obstructive sleep apnea) 05/07/2015  . Right carotid artery occlusion    Chronic RICA occlusion, no stenosis LICA on 01/26/21 carotid Duplex   Past Surgical History:  Procedure Laterality Date  . CARDIOVERSION N/A 06/03/2015   Procedure: CARDIOVERSION;  Surgeon: Wendall Stade, MD;  Location: [facility] ENDOSCOPY;  Service: Cardiovascular;  Laterality: N/A;  . CARDIOVERSION N/A 05/11/2017   Procedure: CARDIOVERSION;  Surgeon: Wendall Stade, MD;  Location: [facility] ENDOSCOPY;  Service: Cardiovascular;  Laterality: N/A;  . CARDIOVERSION N/A 04/11/2020   Procedure: CARDIOVERSION;  Surgeon: Pricilla Riffle, MD;  Location: [facility] ENDOSCOPY;  Service: Cardiovascular;  Laterality: N/A;  . COLONOSCOPY    . ESOPHAGOGASTRODUODENOSCOPY N/A 04/23/2021   Procedure: ESOPHAGOGASTRODUODENOSCOPY (EGD);  Surgeon: Corliss Skains, MD;  Location: [facility] OR;  Service: Thoracic;  Laterality: N/A;  . TONSILLECTOMY    . XI ROBOTIC ASSISTED PARAESOPHAGEAL HERNIA REPAIR N/A 04/23/2021   Procedure: XI ROBOTIC ASSISTED PARAESOPHAGEAL HERNIA REPAIR WITH FUNDOPLICATION USING ACELL GENTRIX SURGICAL MATRIX;  Surgeon: Corliss Skains, MD;  Location: [facility] OR;  Service: Thoracic;  Laterality: N/A;    Current Outpatient Medications  Medication Sig Dispense Refill  . acetaminophen (TYLENOL) 500 MG tablet Take 2 tablets (1,000 mg total) by mouth every 6 (six) hours. 30 tablet 0  . diltiazem (CARDIZEM CD) 360 MG 24 hr capsule Take 1 capsule (360 mg  total) by mouth daily. 30 capsule 1  . esomeprazole (NEXIUM) 20 MG packet Take 20 mg by mouth daily before breakfast.    . rivaroxaban (XARELTO) 20 MG TABS tablet Take 20 mg by mouth daily.    . tamsulosin (FLOMAX) 0.4 MG CAPS capsule Take 0.4 mg by mouth daily.    . Vitamin D, Ergocalciferol, (DRISDOL) 1.25 MG (50000 UNIT) CAPS capsule Take 50,000 Units by mouth every 30 (thirty) days.     No current facility-administered  medications for this encounter.    Allergies  Allergen Reactions  . Ciprofloxacin Swelling    Pt states he took med for 3 days and ankle swelling started.      Social History   Socioeconomic History  . Marital status: Married    Spouse name: Not on file  . Number of children: Not on file  . Years of education: Not on file  . Highest education level: Not on file  Occupational History  . Not on file  Tobacco Use  . Smoking status: Never Smoker  . Smokeless tobacco: Never Used  Vaping Use  . Vaping Use: Never used  Substance and Sexual Activity  . Alcohol use: No    Alcohol/week: 0.0 standard drinks  . Drug use: No  . Sexual activity: Not on file  Other Topics Concern  . Not on file  Social History Narrative  . Not on file   Social Determinants of Health   Financial Resource Strain: Not on file  Food Insecurity: Not on file  Transportation Needs: Not on file  Physical Activity: Not on file  Stress: Not on file  Social Connections: Not on file  Intimate Partner Violence: Not on file    Family History  Problem Relation Age of Onset  . Atrial fibrillation Mother   . Cancer Father        BONE  . Heart disease Father   . Prostate cancer Other     ROS- All systems are reviewed and negative except as per the HPI above  Physical Exam: Vitals:   05/19/21 1054  BP: 134/82  Pulse: 83  Weight: 86.2 kg  Height: 5\' 7"  (1.702 m)   Wt Readings from Last 3 Encounters:  05/19/21 86.2 kg  05/08/21 88 kg  05/07/21 88.1 kg    Labs: Lab Results  Component Value Date   NA 137 05/07/2021   K 4.3 05/07/2021   CL 104 05/07/2021   CO2 25 05/07/2021   GLUCOSE 102 (H) 05/07/2021   BUN 15 05/07/2021   CREATININE 1.25 (H) 05/07/2021   CALCIUM 9.5 05/07/2021   MG 2.2 05/07/2021   Lab Results  Component Value Date   INR 1.2 04/21/2021   No results found for: CHOL, HDL, LDLCALC, TRIG   GEN- The patient is a well appearing male, alert and oriented x 3 today.   HEENT-head normocephalic, atraumatic, sclera clear, conjunctiva pink, hearing intact, trachea midline. Lungs- Clear to ausculation bilaterally, normal work of breathing Heart- irregular rate and rhythm, no murmurs, rubs or gallops  GI- soft, NT, ND, + BS Extremities- no clubbing, cyanosis, or edema MS- no significant deformity or atrophy Skin- no rash or lesion Psych- euthymic mood, full affect Neuro- strength and sensation are intact   EKG- afib  Vent. rate 83 BPM PR interval * ms QRS duration 108 ms QT/QTcB 356/418 ms   Echo 03/06/21 demonstrated  1. Left ventricular ejection fraction, by estimation, is 60 to 65%. The  left ventricle has normal function.  The left ventricle has no regional  wall motion abnormalities. There is mild concentric left ventricular  hypertrophy. Left ventricular diastolic  parameters are consistent with Grade I diastolic dysfunction (impaired  relaxation).  2. Right ventricular systolic function is normal. The right ventricular  size is normal. There is normal pulmonary artery systolic pressure. The  estimated right ventricular systolic pressure is 22.2 mmHg.  3. Left atrial size was moderately dilated.  4. The mitral valve is grossly normal. Trivial mitral valve  regurgitation.  5. The aortic valve is tricuspid. Aortic valve regurgitation is not  visualized.  6. The inferior vena cava is normal in size with greater than 50%  respiratory variability, suggesting right atrial pressure of 3 mmHg.    Assessment and Plan: 1. Persistent afib Patient presents for dofetilide admission. Patient aware of price of dofetilide. Continue Xarelto 20 mg daily, states no missed doses in the last 3 weeks. No recent benadryl use QTc in SR 445 ms Labs today show creatinine at 1.08, K+ 4.1 and mag 2.2, CrCl calculated at 81 mL/min Continue diltiazem 360 mg daily  2. CHA2DS2VASc score of at least 4 Continue  xarelto 20 mg daily without any missed doses    3. OSA He has been referred for [facility] device.   To be admitted later today once a bed becomes available.    Jorja Loa PA-C [facility] [address] [place], [place] [address] [phone]

## 2021-05-19 NOTE — Addendum Note (Signed)
Encounter addended by: Danice Goltz, PA on: 05/19/2021 1:33 PM  Actions taken: Clinical Note Signed

## 2021-05-20 ENCOUNTER — Encounter (HOSPITAL_COMMUNITY): Payer: Self-pay

## 2021-05-20 ENCOUNTER — Encounter (HOSPITAL_COMMUNITY): Payer: Self-pay | Admitting: Thoracic Surgery (Cardiothoracic Vascular Surgery)

## 2021-05-20 DIAGNOSIS — I4819 Other persistent atrial fibrillation: Secondary | ICD-10-CM | POA: Diagnosis not present

## 2021-05-20 LAB — MAGNESIUM: Magnesium: 2.1 mg/dL (ref 1.7–2.4)

## 2021-05-20 LAB — BASIC METABOLIC PANEL
Anion gap: 7 (ref 5–15)
BUN: 12 mg/dL (ref 8–23)
CO2: 25 mmol/L (ref 22–32)
Calcium: 9.1 mg/dL (ref 8.9–10.3)
Chloride: 106 mmol/L (ref 98–111)
Creatinine, Ser: 0.99 mg/dL (ref 0.61–1.24)
GFR, Estimated: >60 mL/min (ref >60)
Glucose, Bld: 101 mg/dL — ABNORMAL HIGH (ref 70–99)
Potassium: 4 mmol/L (ref 3.5–5.1)
Sodium: 138 mmol/L (ref 135–145)

## 2021-05-20 NOTE — Progress Notes (Signed)
Pharmacy: Dofetilide (Tikosyn) - Follow Up Assessment and Electrolyte Replacement  Pharmacy consulted to assist in monitoring and replacing electrolytes in this 65 y.o. male admitted on 05/19/2021 undergoing dofetilide initiation. First dofetilide dose: 05/19/21  Labs:    Component Value Date/Time   K 4.0 05/20/2021 0139   MG 2.1 05/20/2021 0139     Plan: Potassium: K >/= 4: No additional supplementation needed  Magnesium: Mg > 2: No additional supplementation needed  Thank you for allowing pharmacy to participate in this patient's care  Delmar Landau, PharmD, BCPS Clinical Pharmacist 05/20/2021 7:42 AM

## 2021-05-20 NOTE — TOC Benefit Eligibility Note (Signed)
Transition of Care [facility]) Benefit Eligibility Note    Patient Details  Name: ELMO RIO MRN: [id number] Date of Birth: 11/03/1956   Medication/Dose: Joice Lofts  CAPSULE   500 MCG BID  CO-PAY- $342.30      250 MCG BID  CO-PAY- $342.30       125 MC G BID CO-PAY- $342.30  Covered?: Yes  Tier:  (NON-PREFERRED)  Prescription Coverage [facility]  Spoke with Person/Company/Phone Number:: PAGE  @ [facility] [employer] #  [phone] OPT- MEMBER  Co-Pay: $342.30     Deductible: Unmet (OUT-OF-POCKET:UNMET)  Additional Notes: DOFETILIDE  CAPSULE  COVER- YES   500 MCG BID   CO-PAY- $200.00     250 MCG BID  CO-PAY-$200.00    125 MCG BID  CO-PAY- $200.00   TIER- PREFERRED   P/A-NO    Mardene Sayer Phone Number: 05/20/2021, 12:25 PM

## 2021-05-20 NOTE — Progress Notes (Signed)
Mobility Specialist - Progress Note   05/20/21 1727  Mobility  Activity Ambulated in hall  Level of Assistance Independent  Assistive Device None  Distance Ambulated (ft) 400 ft  Mobility Ambulated with assistance in hallway  Mobility Response Tolerated well  Mobility performed by Mobility specialist  $Mobility charge 1 Mobility   Pt asx throughout ambulation. Pt back in bed after walk, call bell at side. VSS throughout.  Mamie Levers Mobility Specialist Mobility Specialist Phone: [phone]

## 2021-05-20 NOTE — Progress Notes (Addendum)
Progress Note  Patient Name: Tanner Rich Date of Encounter: 05/20/2021  [facility] Cardiologist: Charlton Haws, MD   Subjective   No complaints  Inpatient Medications    Scheduled Meds: . diltiazem  360 mg Oral Daily  . dofetilide  500 mcg Oral BID  . pantoprazole  40 mg Oral Daily  . rivaroxaban  20 mg Oral Q supper  . sodium chloride flush  3 mL Intravenous Q12H  . tamsulosin  0.4 mg Oral Daily   Continuous Infusions: . sodium chloride     PRN Meds: sodium chloride, sodium chloride flush   Vital Signs    Vitals:   05/19/21 1142 05/19/21 1751 05/19/21 2005 05/20/21 0500  BP: (!) 124/98 120/90 (!) 129/102 (!) 132/99  Pulse: 84 72 81 77  Resp: 18 18 18 18   Temp: 98.1 F (36.7 C) 98.1 F (36.7 C) 98.4 F (36.9 C) 98.4 F (36.9 C)  TempSrc: Oral Oral Oral Oral  SpO2: 99% 98% 98% 96%    Intake/Output Summary (Last 24 hours) at 05/20/2021 0823 Last data filed at 05/19/2021 1752 Gross per 24 hour  Intake 240 ml  Output --  Net 240 ml   Last 3 Weights 05/19/2021 05/08/2021 05/07/2021  Weight (lbs) 190 lb 194 lb 194 lb 3.2 oz  Weight (kg) 86.183 kg 87.998 kg 88.089 kg      Telemetry    Afib, 80's-130's, currently 80's - Personally Reviewed  ECG    AFib 86bpm, personally/manually measured QT 07/07/2021, Qtc - Personally Reviewed  Physical Exam   GEN: No acute distress.   Neck: No JVD Cardiac: irreg-irreg, no murmurs, rubs, or gallops.  Respiratory: CTA b/l. GI: Soft, nontender, non-distended  MS: No edema; No deformity. Neuro:  Nonfocal  Psych: Normal affect   Labs    High Sensitivity Troponin:  No results for input(s): TROPONINIHS in the last 720 hours.    Chemistry Recent Labs  Lab 05/19/21 1224 05/20/21 0139  NA 140 138  K 4.1 4.0  CL 104 106  CO2 25 25  GLUCOSE 94 101*  BUN 12 12  CREATININE 1.08 0.99  CALCIUM 9.8 9.1  GFRNONAA >60 >60  ANIONGAP 11 7     HematologyNo results for input(s): WBC, RBC, HGB, HCT, MCV, MCH, MCHC,  RDW, PLT in the last 168 hours.  BNPNo results for input(s): BNP, PROBNP in the last 168 hours.   DDimer No results for input(s): DDIMER in the last 168 hours.   Radiology    No results found.  Cardiac Studies   03/06/2021: TTE IMPRESSIONS  1. Left ventricular ejection fraction, by estimation, is 60 to 65%. The  left ventricle has normal function. The left ventricle has no regional  wall motion abnormalities. There is mild concentric left ventricular  hypertrophy. Left ventricular diastolic  parameters are consistent with Grade I diastolic dysfunction (impaired  relaxation).  2. Right ventricular systolic function is normal. The right ventricular  size is normal. There is normal pulmonary artery systolic pressure. The  estimated right ventricular systolic pressure is 22.2 mmHg.  3. Left atrial size was moderately dilated.  4. The mitral valve is grossly normal. Trivial mitral valve  regurgitation.  5. The aortic valve is tricuspid. Aortic valve regurgitation is not  visualized.  6. The inferior vena cava is normal in size with greater than 50%  respiratory variability, suggesting right atrial pressure of 3 mmHg.   Patient Profile     65 y.o. male with a hx  of prior stroke, HTN, HLD, obesity, OSA (untreated, intolerant of mask) and AFib admitted for Tikosyn initiation  Assessment & Plan    1. Paroxysmal AFib     CHA2DS2Vasc is 4, on Xarelto     tikosyn load is in progress     K+ 4.0     Mag 2.1     Creat 0.99     QTc is OK  DCCV tomorrow if not in SR, Dr. Elberta Fortis discussed with the patient he is agreeable NPO after MN  2. HTN     Home meds   For questions or updates, please contact [facility] Please consult [email or web address] for contact info under        Signed, Sheilah Pigeon, PA-C  05/20/2021, 8:23 AM    I have seen and examined this patient with Francis Dowse.  Agree with above, note added to reflect my findings.  On exam, irregular, no murmurs.      Feleshia Zundel M. Haiden Clucas MD 05/20/2021 11:07 AM

## 2021-05-20 NOTE — Progress Notes (Signed)
Pt's 2 hour post Tikosyn for evening dose with a QTc of 476. Pt tolerated well. Will continue to monitor.   Bari Edward, RN

## 2021-05-20 NOTE — Progress Notes (Signed)
Pt converted to NSR this afternoon.  EKG has been done.  Hinton Dyer, RN

## 2021-05-20 NOTE — Care Management (Addendum)
05-20-21 Benefits check submitted for Tikosyn, Case Manager will follow for cost. Case Manager will discuss cost and outpatient pharmacy of choice. Gala Lewandowsky, RN,BSN Case Manager

## 2021-05-20 NOTE — H&P (Addendum)
Primary Care Physician: Marcell Anger, NP Referring Physician: Dr. Brooke Pace is a 65 y.o. male with a h/o persistent  afib that failed flecainide and was referred to Dr. Lalla Brothers 01/2021,  to be considered for ablation. The imaging needed for ablation revealed a large Hiatal hernia. Ablation plans were  discontinued  due to safety concerns of burning  left atrium with HH comprising  Left atrium. Flecainide was discontinued. The original plan was to have tikosyn admit then [facility] repair but the timing did not work out that way. He is now around 2 weeks s/p hernia repair. He is here to discuss Tikosyn admit. He restarted anticoagulation 4/29, after  missing one day pre op.   No benadryl use. Qtc acceptable. Discussed price of drug. He plans to use good rx. He is rate controlled but  is bothered with fatigue and she shortness of  breath with exertion. He was seen by EP during his recent surgery and diltiazem was increased to 360 mg daily.  I did discuss with Dr. Lalla Brothers  going ahead with tikosyn admit vrs ablation and he wants him to go ahead with tikosyn admit for now but he will ablate down the road when he has fully  recovered from his surgery.  Follow up 05/19/21 in the AF clinic. Patient presents for dofetilide admission. He continues to have SOB with exertion. He denies any bleeding issues on anticoagulation and has not missed any doses in the last 3 weeks.    Today, he denies symptoms of palpitations, chest pain, orthopnea, PND, lower extremity edema, dizziness, presyncope, syncope, or neurologic sequela. The patient is tolerating medications without difficulties and is otherwise without complaint today.   Past Medical History:  Diagnosis Date  . A-fib (HCC)   . Complication of anesthesia    "waking up, throat closed up"  . CVA (cerebral vascular accident) (HCC) 2000  . GERD (gastroesophageal reflux disease)   . History of kidney stones   . HLD (hyperlipidemia)   . HTN  (hypertension)   . Obesity   . OSA (obstructive sleep apnea)   . OSA (obstructive sleep apnea) 05/07/2015  . Right carotid artery occlusion    Chronic RICA occlusion, no stenosis LICA on 01/26/21 carotid Duplex   Past Surgical History:  Procedure Laterality Date  . CARDIOVERSION N/A 06/03/2015   Procedure: CARDIOVERSION;  Surgeon: Wendall Stade, MD;  Location: [facility] ENDOSCOPY;  Service: Cardiovascular;  Laterality: N/A;  . CARDIOVERSION N/A 05/11/2017   Procedure: CARDIOVERSION;  Surgeon: Wendall Stade, MD;  Location: [facility] ENDOSCOPY;  Service: Cardiovascular;  Laterality: N/A;  . CARDIOVERSION N/A 04/11/2020   Procedure: CARDIOVERSION;  Surgeon: Pricilla Riffle, MD;  Location: [facility] ENDOSCOPY;  Service: Cardiovascular;  Laterality: N/A;  . COLONOSCOPY    . ESOPHAGOGASTRODUODENOSCOPY N/A 04/23/2021   Procedure: ESOPHAGOGASTRODUODENOSCOPY (EGD);  Surgeon: Corliss Skains, MD;  Location: [facility] OR;  Service: Thoracic;  Laterality: N/A;  . TONSILLECTOMY    . XI ROBOTIC ASSISTED PARAESOPHAGEAL HERNIA REPAIR N/A 04/23/2021   Procedure: XI ROBOTIC ASSISTED PARAESOPHAGEAL HERNIA REPAIR WITH FUNDOPLICATION USING ACELL GENTRIX SURGICAL MATRIX;  Surgeon: Corliss Skains, MD;  Location: [facility] OR;  Service: Thoracic;  Laterality: N/A;    Current Facility-Administered Medications  Medication Dose Route Frequency Provider Last Rate Last Admin  . 0.9 %  sodium chloride infusion  250 mL Intravenous PRN Fenton, Clint R, PA      . diltiazem (CARDIZEM CD) 24 hr capsule 360 mg  360 mg Oral Daily Fenton, Clint R, PA      . dofetilide (TIKOSYN) capsule 500 mcg  500 mcg Oral BID Fenton, Clint R, PA   500 mcg at 05/19/21 2021  . pantoprazole (PROTONIX) EC tablet 40 mg  40 mg Oral Daily Mosetta Anis, [facility]      . rivaroxaban (XARELTO) tablet 20 mg  20 mg Oral Q supper Fenton, Clint R, PA   20 mg at 05/19/21 1631  . sodium chloride flush (NS) 0.9 % injection 3 mL  3 mL Intravenous Q12H Fenton, Clint R, PA   3 mL at  05/19/21 1235  . sodium chloride flush (NS) 0.9 % injection 3 mL  3 mL Intravenous PRN Fenton, Clint R, PA      . tamsulosin (FLOMAX) capsule 0.4 mg  0.4 mg Oral Daily Fenton, Clint R, PA        Allergies  Allergen Reactions  . Ciprofloxacin Swelling    Pt states he took med for 3 days and ankle swelling started.      Social History   Socioeconomic History  . Marital status: Married    Spouse name: Not on file  . Number of children: Not on file  . Years of education: Not on file  . Highest education level: Not on file  Occupational History  . Not on file  Tobacco Use  . Smoking status: Never Smoker  . Smokeless tobacco: Never Used  Vaping Use  . Vaping Use: Never used  Substance and Sexual Activity  . Alcohol use: No    Alcohol/week: 0.0 standard drinks  . Drug use: No  . Sexual activity: Not on file  Other Topics Concern  . Not on file  Social History Narrative  . Not on file   Social Determinants of Health   Financial Resource Strain: Not on file  Food Insecurity: Not on file  Transportation Needs: Not on file  Physical Activity: Not on file  Stress: Not on file  Social Connections: Not on file  Intimate Partner Violence: Not on file    Family History  Problem Relation Age of Onset  . Atrial fibrillation Mother   . Cancer Father        BONE  . Heart disease Father   . Prostate cancer Other     ROS- All systems are reviewed and negative except as per the HPI above  Physical Exam: Vitals:   05/19/21 1142 05/19/21 1751 05/19/21 2005 05/20/21 0500  BP: (!) 124/98 120/90 (!) 129/102 (!) 132/99  Pulse: 84 72 81 77  Resp: 18 18 18 18   Temp: 98.1 F (36.7 C) 98.1 F (36.7 C) 98.4 F (36.9 C) 98.4 F (36.9 C)  TempSrc: Oral Oral Oral Oral  SpO2: 99% 98% 98% 96%   Wt Readings from Last 3 Encounters:  05/19/21 86.2 kg  05/08/21 88 kg  05/07/21 88.1 kg    Labs: Lab Results  Component Value Date   NA 138 05/20/2021   K 4.0 05/20/2021   CL 106  05/20/2021   CO2 25 05/20/2021   GLUCOSE 101 (H) 05/20/2021   BUN 12 05/20/2021   CREATININE 0.99 05/20/2021   CALCIUM 9.1 05/20/2021   MG 2.1 05/20/2021   Lab Results  Component Value Date   INR 1.2 04/21/2021   No results found for: CHOL, HDL, LDLCALC, TRIG   GEN- The patient is a well appearing male, alert and oriented x 3 today.  HEENT-head normocephalic, atraumatic, sclera clear, conjunctiva  pink, hearing intact, trachea midline. Lungs- Clear to ausculation bilaterally, normal work of breathing Heart- irregular rate and rhythm, no murmurs, rubs or gallops  GI- soft, NT, ND, + BS Extremities- no clubbing, cyanosis, or edema MS- no significant deformity or atrophy Skin- no rash or lesion Psych- euthymic mood, full affect Neuro- strength and sensation are intact   EKG- afib  Vent. rate 83 BPM PR interval * ms QRS duration 108 ms QT/QTcB 356/418 ms   Echo 03/06/21 demonstrated  1. Left ventricular ejection fraction, by estimation, is 60 to 65%. The  left ventricle has normal function. The left ventricle has no regional  wall motion abnormalities. There is mild concentric left ventricular  hypertrophy. Left ventricular diastolic  parameters are consistent with Grade I diastolic dysfunction (impaired  relaxation).  2. Right ventricular systolic function is normal. The right ventricular  size is normal. There is normal pulmonary artery systolic pressure. The  estimated right ventricular systolic pressure is 22.2 mmHg.  3. Left atrial size was moderately dilated.  4. The mitral valve is grossly normal. Trivial mitral valve  regurgitation.  5. The aortic valve is tricuspid. Aortic valve regurgitation is not  visualized.  6. The inferior vena cava is normal in size with greater than 50%  respiratory variability, suggesting right atrial pressure of 3 mmHg.    Assessment and Plan: 1. Persistent afib Patient presents for dofetilide admission. Patient aware of  price of dofetilide. Continue Xarelto 20 mg daily, states no missed doses in the last 3 weeks. No recent benadryl use QTc in SR 445 ms Labs today show creatinine at 1.08, K+ 4.1 and mag 2.2, CrCl calculated at 81 mL/min Continue diltiazem 360 mg daily  2. CHA2DS2VASc score of at least 4 Continue  xarelto 20 mg daily without any missed doses   3. OSA He has been referred for [employer] device.   To be admitted later today once a bed becomes available.    Jorja Loa PA-C Af[facility] [address] [place], [place] [address] [phone]   I have seen, examined the patient, and reviewed the above assessment and plan.  Changes to above are made where necessary.  On exam, iRRR.  Will admit for initiation of tikosyn.  Reports compliance with xarelto without interruption.   Co Sign: Hillis Range, MD

## 2021-05-20 NOTE — Progress Notes (Signed)
Charge Nurse note: Pt in for Tikosyn Load and received first dose 5/24 at 2021. Two hour post dose EKG was not obtained. Will have EKG done this am, will pull strip off tele to attempt Qtc from aprox 2221. Dierdre Highman, RN

## 2021-05-21 ENCOUNTER — Encounter (HOSPITAL_COMMUNITY): Payer: Self-pay | Admitting: Internal Medicine

## 2021-05-21 ENCOUNTER — Encounter (HOSPITAL_COMMUNITY)
Admission: AD | Disposition: A | Discharge status: Home / Self Care | Payer: Self-pay | Source: Ambulatory Visit | Attending: Cardiology

## 2021-05-21 DIAGNOSIS — I4819 Other persistent atrial fibrillation: Secondary | ICD-10-CM | POA: Diagnosis not present

## 2021-05-21 LAB — BASIC METABOLIC PANEL
Anion gap: 7 (ref 5–15)
BUN: 12 mg/dL (ref 8–23)
CO2: 28 mmol/L (ref 22–32)
Calcium: 9.2 mg/dL (ref 8.9–10.3)
Chloride: 104 mmol/L (ref 98–111)
Creatinine, Ser: 0.94 mg/dL (ref 0.61–1.24)
GFR, Estimated: >60 mL/min (ref >60)
Glucose, Bld: 99 mg/dL (ref 70–99)
Potassium: 3.7 mmol/L (ref 3.5–5.1)
Sodium: 139 mmol/L (ref 135–145)

## 2021-05-21 LAB — MAGNESIUM: Magnesium: 2 mg/dL (ref 1.7–2.4)

## 2021-05-21 SURGERY — CARDIOVERSION
Anesthesia: General

## 2021-05-21 MED ORDER — POTASSIUM CHLORIDE CRYS ER 20 MEQ PO TBCR
60.0000 meq | EXTENDED_RELEASE_TABLET | Freq: Once | ORAL | Status: AC
  Administered 2021-05-21: 60 meq via ORAL
  Filled 2021-05-21: qty 3

## 2021-05-21 MED ORDER — MAGNESIUM SULFATE 2 GM/50ML IV SOLN
2.0000 g | Freq: Once | INTRAVENOUS | Status: AC
  Administered 2021-05-21: 2 g via INTRAVENOUS
  Filled 2021-05-21: qty 50

## 2021-05-21 NOTE — Discharge Summary (Addendum)
ELECTROPHYSIOLOGY PROCEDURE DISCHARGE SUMMARY    Patient ID: Tanner Rich,  MRN: [id number], DOB/AGE: 09/23/1956 65 y.o.  Admit date: 05/19/2021 Discharge date: 05/22/21  Primary Care Physician: Marcell Anger, NP  Primary Cardiologist: Dr. Eden Emms Electrophysiologist: Dr. Lalla Brothers  Primary Discharge Diagnosis:  1.  Paroxysmal  atrial fibrillation status post Tikosyn loading this admission      CHA2DS2Vasc is 4, on Xarelto  Secondary Discharge Diagnosis:  1. HTN   Allergies  Allergen Reactions  . Ciprofloxacin Swelling and Other (See Comments)    Pt states he took med for 3 days and ankle swelling started.       Procedures This Admission:  1.  Tikosyn loading    Brief HPI: Tanner Rich is a 65 y.o. male with a past medical history as noted above.  They were referred to EP in the outpatient setting for treatment options of atrial fibrillation.  Risks, benefits, and alternatives to Tikosyn were reviewed with the patient who wished to proceed.    Hospital Course:  The patient was admitted and Tikosyn was initiated.  Renal function and electrolytes were followed during the hospitalization.  The patient's QTc remained stable.  He converted with drug and did not require DCCV.  He was monitored until discharge on telemetry which demonstrated SR.  On the day of discharge, the patient feels well, was examined by Dr Lalla Brothers who considered the patient stable for discharge to home.  Follow-up has been arranged with the AFib clinic in 1 week and with Dr Lalla Brothers in 4 weeks.   Tikosyn teaching was completed No additional or new electrolyte replacement for home 1st fill will be TOC  Physical Exam: Vitals:   05/21/21 0429 05/21/21 1655 05/21/21 2031 05/22/21 1013  BP: 139/85 128/66 127/78 (!) 144/80  Pulse: 65 64 61 68  Resp: 15 16 16 16   Temp: 98.9 F (37.2 C) 98.6 F (37 C) 98.6 F (37 C) 98.5 F (36.9 C)  TempSrc: Oral Oral Oral Oral  SpO2: 96% 96%  96%    GEN- The  patient is well appearing, alert and oriented x 3 today.   HEENT: normocephalic, atraumatic; sclera clear, conjunctiva pink; hearing intact; oropharynx clear; neck supple, no JVP Lymph- no cervical lymphadenopathy Lungs- CTA b/l, normal work of breathing.  No wheezes, rales, rhonchi Heart- RRR, no murmurs, rubs or gallops, PMI not laterally displaced GI- soft, non-tender, non-distended Extremities- no clubbing, cyanosis, or edema MS- no significant deformity or atrophy Skin- warm and dry, no rash or lesion Psych- euthymic mood, full affect Neuro- strength and sensation are intact   Labs:   Lab Results  Component Value Date   WBC 8.3 05/07/2021   HGB 10.0 (L) 05/07/2021   HCT 33.7 (L) 05/07/2021   MCV 93.9 05/07/2021   PLT 491 (H) 05/07/2021    Recent Labs  Lab 05/22/21 0248  NA 138  K 4.0  CL 106  CO2 28  BUN 13  CREATININE 0.93  CALCIUM 9.3  GLUCOSE 99     Discharge Medications:  Allergies as of 05/22/2021      Reactions   Ciprofloxacin Swelling, Other (See Comments)   Pt states he took med for 3 days and ankle swelling started.      Medication List    TAKE these medications   acetaminophen 500 MG tablet Commonly known as: TYLENOL Take 2 tablets (1,000 mg total) by mouth every 6 (six) hours.   diltiazem 360 MG 24 hr capsule Commonly  known as: CARDIZEM CD Take 1 capsule (360 mg total) by mouth daily.   dofetilide 500 MCG capsule Commonly known as: TIKOSYN Take 1 capsule (500 mcg total) by mouth 2 (two) times daily.   rivaroxaban 20 MG Tabs tablet Commonly known as: XARELTO Take 20 mg by mouth daily.   tamsulosin 0.4 MG Caps capsule Commonly known as: FLOMAX Take 0.4 mg by mouth daily.   Vitamin D (Ergocalciferol) 1.25 MG (50000 UNIT) Caps capsule Commonly known as: DRISDOL Take 50,000 Units by mouth every 30 (thirty) days.       Disposition: Home Discharge Instructions    Diet - low sodium heart healthy   Complete by: As directed     Increase activity slowly   Complete by: As directed       Follow-up Information    [facility] Follow up.   Specialty: Cardiology Why: 05/28/21 @ 11:30AM with R. Charlean Merl, [place] Contact information: [address] [id number] mc [place] [address] [phone]       Lanier Prude, MD Follow up.   Specialties: Cardiology, Radiology Why: 07/10/21 @ 10:00AM Contact information: [address] [place] [address] [phone]               Duration of Discharge Encounter: Greater than 30 minutes including physician time.  Norma Fredrickson, PA-C 05/22/2021 1:59 PM

## 2021-05-21 NOTE — Progress Notes (Signed)
Progress Note  Patient Name: Tanner Rich Date of Encounter: 05/21/2021  [facility] Cardiologist: Charlton Haws, MD   Subjective   No complaints, OOB this AM  Inpatient Medications    Scheduled Meds: . diltiazem  360 mg Oral Daily  . dofetilide  500 mcg Oral BID  . pantoprazole  40 mg Oral Daily  . potassium chloride  60 mEq Oral Once  . rivaroxaban  20 mg Oral Q supper  . sodium chloride flush  3 mL Intravenous Q12H  . tamsulosin  0.4 mg Oral Daily   Continuous Infusions: . sodium chloride    . magnesium sulfate bolus IVPB     PRN Meds: sodium chloride, sodium chloride flush   Vital Signs    Vitals:   05/20/21 0500 05/20/21 1500 05/20/21 2013 05/21/21 0429  BP: (!) 132/99 132/79 135/90 139/85  Pulse: 77 60 65 65  Resp: 18 16 16 15   Temp: 98.4 F (36.9 C) 98.6 F (37 C) 98.9 F (37.2 C) 98.9 F (37.2 C)  TempSrc: Oral Oral Oral Oral  SpO2: 96%  97% 96%    Intake/Output Summary (Last 24 hours) at 05/21/2021 0814 Last data filed at 05/20/2021 0958 Gross per 24 hour  Intake 363 ml  Output --  Net 363 ml   Last 3 Weights 05/19/2021 05/08/2021 05/07/2021  Weight (lbs) 190 lb 194 lb 194 lb 3.2 oz  Weight (kg) 86.183 kg 87.998 kg 88.089 kg      Telemetry    SB/SR 50's-80's - Personally Reviewed  ECG    SR 65bpm, QT 458/QTc 476 - Personally Reviewed  Physical Exam   GEN: No acute distress.   Neck: No JVD Cardiac: RRR, no murmurs, rubs, or gallops.  Respiratory: CTA b/l. GI: Soft, nontender, non-distended  MS: No edema; No deformity. Neuro:  Nonfocal  Psych: Normal affect   Labs    High Sensitivity Troponin:  No results for input(s): TROPONINIHS in the last 720 hours.    Chemistry Recent Labs  Lab 05/19/21 1224 05/20/21 0139 05/21/21 0311  NA 140 138 139  K 4.1 4.0 3.7  CL 104 106 104  CO2 25 25 28   GLUCOSE 94 101* 99  BUN 12 12 12   CREATININE 1.08 0.99 0.94  CALCIUM 9.8 9.1 9.2  GFRNONAA >60 >60 >60  ANIONGAP 11 7 7       HematologyNo results for input(s): WBC, RBC, HGB, HCT, MCV, MCH, MCHC, RDW, PLT in the last 168 hours.  BNPNo results for input(s): BNP, PROBNP in the last 168 hours.   DDimer No results for input(s): DDIMER in the last 168 hours.   Radiology    No results found.  Cardiac Studies   03/06/2021: TTE IMPRESSIONS  1. Left ventricular ejection fraction, by estimation, is 60 to 65%. The  left ventricle has normal function. The left ventricle has no regional  wall motion abnormalities. There is mild concentric left ventricular  hypertrophy. Left ventricular diastolic  parameters are consistent with Grade I diastolic dysfunction (impaired  relaxation).  2. Right ventricular systolic function is normal. The right ventricular  size is normal. There is normal pulmonary artery systolic pressure. The  estimated right ventricular systolic pressure is 22.2 mmHg.  3. Left atrial size was moderately dilated.  4. The mitral valve is grossly normal. Trivial mitral valve  regurgitation.  5. The aortic valve is tricuspid. Aortic valve regurgitation is not  visualized.  6. The inferior vena cava is normal in size with greater than  50%  respiratory variability, suggesting right atrial pressure of 3 mmHg.   Patient Profile     65 y.o. male with a hx of prior stroke, HTN, HLD, obesity, OSA (untreated, intolerant of mask) and AFib admitted for Tikosyn initiation  Assessment & Plan    1. Paroxysmal AFib     CHA2DS2Vasc is 4, on Xarelto     tikosyn load is in progress     K+ 3.7 (replacement ordered)     Mag 2.0     Creat 0.94     QTc is OK  Cardioverted with drug Anticipate discharge tomorrow  2. HTN     Home meds   For questions or updates, please contact [facility] Please consult [email or web address] for contact info under        Signed, Sheilah Pigeon, PA-C  05/21/2021, 8:14 AM

## 2021-05-21 NOTE — Progress Notes (Signed)
Pharmacy: Dofetilide (Tikosyn) - Follow Up Assessment and Electrolyte Replacement  Pharmacy consulted to assist in monitoring and replacing electrolytes in this 65 y.o. male admitted on 05/19/2021 undergoing dofetilide initiation. First dofetilide dose: 05/19/21  Labs:    Component Value Date/Time   K 3.7 05/21/2021 0311   MG 2.0 05/21/2021 5726     Plan: Potassium: K 3.5-3.7:  Give KCl 60 mEq po x1   Magnesium: Mg 1.8-2: Give Mg 2 gm IV x1    As patient has required on average 0 mEq of potassium replacement every day, will continue to monitor for outpatient recommendations.  Thank you for allowing pharmacy to participate in this patient's care   Cathie Hoops 05/21/2021  7:42 AM

## 2021-05-21 NOTE — Care Management (Signed)
1635 05-21-21 Case Manager discussed co pay with the patient and he wants to use [facility] due to cost. Patient wants his first fill via [facility] Pharmacy cost to be $30.00 and he wants Rx refills for 90 day supply sent to [facility] [address] [place].

## 2021-05-22 ENCOUNTER — Other Ambulatory Visit (HOSPITAL_COMMUNITY): Payer: Self-pay

## 2021-05-22 DIAGNOSIS — I4819 Other persistent atrial fibrillation: Secondary | ICD-10-CM | POA: Diagnosis not present

## 2021-05-22 LAB — BASIC METABOLIC PANEL
Anion gap: 4 — ABNORMAL LOW (ref 5–15)
BUN: 13 mg/dL (ref 8–23)
CO2: 28 mmol/L (ref 22–32)
Calcium: 9.3 mg/dL (ref 8.9–10.3)
Chloride: 106 mmol/L (ref 98–111)
Creatinine, Ser: 0.93 mg/dL (ref 0.61–1.24)
GFR, Estimated: >60 mL/min (ref >60)
Glucose, Bld: 99 mg/dL (ref 70–99)
Potassium: 4 mmol/L (ref 3.5–5.1)
Sodium: 138 mmol/L (ref 135–145)

## 2021-05-22 LAB — MAGNESIUM: Magnesium: 2.2 mg/dL (ref 1.7–2.4)

## 2021-05-22 MED ORDER — DOFETILIDE 500 MCG PO CAPS: 500.0000 ug | ORAL_CAPSULE | Freq: Two times a day (BID) | ORAL | 5 refills | Status: DC

## 2021-05-22 MED ORDER — DOFETILIDE 500 MCG PO CAPS
500.0000 ug | ORAL_CAPSULE | Freq: Two times a day (BID) | ORAL | 0 refills | Status: DC
  Filled 2021-05-22: qty 60, 30d supply, fill #0

## 2021-05-22 NOTE — Progress Notes (Signed)
Pharmacy: Dofetilide (Tikosyn) - Follow Up Assessment and Electrolyte Replacement  Pharmacy consulted to assist in monitoring and replacing electrolytes in this 65 y.o. male admitted on 05/19/2021 undergoing dofetilide initiation. First dofetilide dose: 05/19/21  Labs:    Component Value Date/Time   K 4.0 05/22/2021 0248   MG 2.2 05/22/2021 0248     Plan: Potassium: K >/= 4: No additional supplementation needed  Magnesium: Mg > 2: No additional supplementation needed   As patient has required on average 20 mEq of potassium replacement every day, will continue to monitor for outpatient recommendations.  Thank you for allowing pharmacy to participate in this patient's care   Fredonia Highland, PharmD, BCPS, [facility] Clinical Pharmacist [phone] Please check AMION for all [facility] Pharmacy numbers 05/22/2021

## 2021-05-28 ENCOUNTER — Ambulatory Visit (HOSPITAL_COMMUNITY)
Admission: RE | Admit: 2021-05-28 | Discharge: 2021-05-28 | Disposition: A | Discharge status: Home / Self Care | Payer: Medicare Other | Source: Ambulatory Visit | Attending: Physician Assistant | Admitting: Physician Assistant

## 2021-05-28 ENCOUNTER — Other Ambulatory Visit: Payer: Self-pay

## 2021-05-28 ENCOUNTER — Encounter (HOSPITAL_COMMUNITY): Payer: Self-pay | Admitting: Physician Assistant

## 2021-05-28 VITALS — BP 134/74 | HR 51 | Ht 67.0 in | Wt 188.4 lb

## 2021-05-28 DIAGNOSIS — G4733 Obstructive sleep apnea (adult) (pediatric): Secondary | ICD-10-CM | POA: Diagnosis not present

## 2021-05-28 DIAGNOSIS — Z881 Allergy status to other antibiotic agents status: Secondary | ICD-10-CM | POA: Insufficient documentation

## 2021-05-28 DIAGNOSIS — D6869 Other thrombophilia: Secondary | ICD-10-CM

## 2021-05-28 DIAGNOSIS — Z7901 Long term (current) use of anticoagulants: Secondary | ICD-10-CM | POA: Diagnosis not present

## 2021-05-28 DIAGNOSIS — R001 Bradycardia, unspecified: Secondary | ICD-10-CM | POA: Insufficient documentation

## 2021-05-28 DIAGNOSIS — I4819 Other persistent atrial fibrillation: Secondary | ICD-10-CM | POA: Diagnosis not present

## 2021-05-28 DIAGNOSIS — Z8249 Family history of ischemic heart disease and other diseases of the circulatory system: Secondary | ICD-10-CM | POA: Diagnosis not present

## 2021-05-28 DIAGNOSIS — Z79899 Other long term (current) drug therapy: Secondary | ICD-10-CM | POA: Insufficient documentation

## 2021-05-28 LAB — BASIC METABOLIC PANEL
Anion gap: 8 (ref 5–15)
BUN: 14 mg/dL (ref 8–23)
CO2: 25 mmol/L (ref 22–32)
Calcium: 9.3 mg/dL (ref 8.9–10.3)
Chloride: 106 mmol/L (ref 98–111)
Creatinine, Ser: 1.01 mg/dL (ref 0.61–1.24)
GFR, Estimated: >60 mL/min (ref >60)
Glucose, Bld: 102 mg/dL — ABNORMAL HIGH (ref 70–99)
Potassium: 3.7 mmol/L (ref 3.5–5.1)
Sodium: 139 mmol/L (ref 135–145)

## 2021-05-28 LAB — MAGNESIUM: Magnesium: 2.2 mg/dL (ref 1.7–2.4)

## 2021-05-28 MED ORDER — DOFETILIDE 500 MCG PO CAPS: 500.0000 ug | ORAL_CAPSULE | Freq: Two times a day (BID) | ORAL | 6 refills | Status: DC

## 2021-05-28 NOTE — Progress Notes (Signed)
Primary Care Physician: Marcell Anger, NP Referring Physician: Dr. Brooke Pace is a 65 y.o. male with a h/o persistent  afib that failed flecainide and was referred to Dr. Lalla Brothers 01/2021,  to be considered for ablation. The imaging needed for ablation revealed a large Hiatal hernia. Ablation plans were  discontinued  due to safety concerns of burning  left atrium with HH comprising  Left atrium. Flecainide was discontinued. The original plan was to have tikosyn admit then [facility] repair but the timing did not work out that way. He is now around 2 weeks s/p hernia repair. He is here to discuss Tikosyn admit. He restarted anticoagulation 4/29, after  missing one day pre op.   No benadryl use. Qtc acceptable. Discussed price of drug. He plans to use [facility]. He is rate controlled but  is bothered with fatigue and she shortness of  breath with exertion. He was seen by EP during his recent surgery and diltiazem was increased to 360 mg daily.  I did discuss with Dr. Lalla Brothers  going ahead with tikosyn admit vrs ablation and he wants him to go ahead with tikosyn admit for now but he will ablate down the road when he has fully  recovered from his surgery.  Follow up 05/19/21 in the AF clinic. Patient presents for dofetilide admission. He continues to have SOB with exertion. He denies any bleeding issues on anticoagulation and has not missed any doses in the last 3 weeks.    Follow up in the AF clinic 05/28/21. Patient is s/p dofetilide loading 5/24-5/27/22. He converted with the medication and did not require DCCV. He did have an episode of afib on 05/25/21 which lasted about 6 hours. He denies any bleeding issues on anticoagulation.   Today, he denies symptoms of palpitations, chest pain, orthopnea, PND, lower extremity edema, dizziness, presyncope, syncope, or neurologic sequela. The patient is tolerating medications without difficulties and is otherwise without complaint today.   Past Medical  History:  Diagnosis Date  . A-fib (HCC)   . Complication of anesthesia    "waking up, throat closed up"  . CVA (cerebral vascular accident) (HCC) 2000  . GERD (gastroesophageal reflux disease)   . History of kidney stones   . HLD (hyperlipidemia)   . HTN (hypertension)   . Obesity   . OSA (obstructive sleep apnea)   . OSA (obstructive sleep apnea) 05/07/2015  . Right carotid artery occlusion    Chronic RICA occlusion, no stenosis LICA on 01/26/21 carotid Duplex   Past Surgical History:  Procedure Laterality Date  . CARDIOVERSION N/A 06/03/2015   Procedure: CARDIOVERSION;  Surgeon: Wendall Stade, MD;  Location: [facility] ENDOSCOPY;  Service: Cardiovascular;  Laterality: N/A;  . CARDIOVERSION N/A 05/11/2017   Procedure: CARDIOVERSION;  Surgeon: Wendall Stade, MD;  Location: [facility] ENDOSCOPY;  Service: Cardiovascular;  Laterality: N/A;  . CARDIOVERSION N/A 04/11/2020   Procedure: CARDIOVERSION;  Surgeon: Pricilla Riffle, MD;  Location: [facility] ENDOSCOPY;  Service: Cardiovascular;  Laterality: N/A;  . COLONOSCOPY    . ESOPHAGOGASTRODUODENOSCOPY N/A 04/23/2021   Procedure: ESOPHAGOGASTRODUODENOSCOPY (EGD);  Surgeon: Corliss Skains, MD;  Location: [facility] OR;  Service: Thoracic;  Laterality: N/A;  . TONSILLECTOMY    . XI ROBOTIC ASSISTED PARAESOPHAGEAL HERNIA REPAIR N/A 04/23/2021   Procedure: XI ROBOTIC ASSISTED PARAESOPHAGEAL HERNIA REPAIR WITH FUNDOPLICATION USING ACELL GENTRIX SURGICAL MATRIX;  Surgeon: Corliss Skains, MD;  Location: [facility] OR;  Service: Thoracic;  Laterality: N/A;  Current Outpatient Medications  Medication Sig Dispense Refill  . acetaminophen (TYLENOL) 500 MG tablet Take 2 tablets (1,000 mg total) by mouth every 6 (six) hours. 30 tablet 0  . diltiazem (CARDIZEM CD) 360 MG 24 hr capsule Take 1 capsule (360 mg total) by mouth daily. 30 capsule 1  . rivaroxaban (XARELTO) 20 MG TABS tablet Take 20 mg by mouth daily.    . tamsulosin (FLOMAX) 0.4 MG CAPS capsule Take 0.4 mg by mouth  daily.    . Vitamin D, Ergocalciferol, (DRISDOL) 1.25 MG (50000 UNIT) CAPS capsule Take 50,000 Units by mouth every 30 (thirty) days.    Marland Kitchen dofetilide (TIKOSYN) 500 MCG capsule Take 1 capsule (500 mcg total) by mouth 2 (two) times daily. 60 capsule 6   No current facility-administered medications for this encounter.    Allergies  Allergen Reactions  . Ciprofloxacin Swelling and Other (See Comments)    Pt states he took med for 3 days and ankle swelling started.      Social History   Socioeconomic History  . Marital status: Married    Spouse name: Not on file  . Number of children: Not on file  . Years of education: Not on file  . Highest education level: Not on file  Occupational History  . Not on file  Tobacco Use  . Smoking status: Never Smoker  . Smokeless tobacco: Never Used  Vaping Use  . Vaping Use: Never used  Substance and Sexual Activity  . Alcohol use: No    Alcohol/week: 0.0 standard drinks  . Drug use: No  . Sexual activity: Not on file  Other Topics Concern  . Not on file  Social History Narrative  . Not on file   Social Determinants of Health   Financial Resource Strain: Not on file  Food Insecurity: Not on file  Transportation Needs: Not on file  Physical Activity: Not on file  Stress: Not on file  Social Connections: Not on file  Intimate Partner Violence: Not on file    Family History  Problem Relation Age of Onset  . Atrial fibrillation Mother   . Cancer Father        BONE  . Heart disease Father   . Prostate cancer Other     ROS- All systems are reviewed and negative except as per the HPI above  Physical Exam: Vitals:   05/28/21 1124  BP: 134/74  Pulse: (!) 51  Weight: 85.5 kg  Height: 5\' 7"  (1.702 m)   Wt Readings from Last 3 Encounters:  05/28/21 85.5 kg  05/19/21 86.2 kg  05/08/21 88 kg    Labs: Lab Results  Component Value Date   NA 138 05/22/2021   K 4.0 05/22/2021   CL 106 05/22/2021   CO2 28 05/22/2021    GLUCOSE 99 05/22/2021   BUN 13 05/22/2021   CREATININE 0.93 05/22/2021   CALCIUM 9.3 05/22/2021   MG 2.2 05/22/2021   Lab Results  Component Value Date   INR 1.2 04/21/2021   No results found for: CHOL, HDL, LDLCALC, TRIG   GEN- The patient is a well appearing male, alert and oriented x 3 today.   HEENT-head normocephalic, atraumatic, sclera clear, conjunctiva pink, hearing intact, trachea midline. Lungs- Clear to ausculation bilaterally, normal work of breathing Heart- Regular rate and rhythm, bradycardia, no murmurs, rubs or gallops  GI- soft, NT, ND, + BS Extremities- no clubbing, cyanosis, or edema MS- no significant deformity or atrophy Skin- no rash or lesion  Psych- euthymic mood, full affect Neuro- strength and sensation are intact   EKG today demonstrated  SB Vent. rate 51 BPM PR interval 164 ms QRS duration 114 ms QT/QTcB 502/462 ms   Echo 03/06/21 demonstrated  1. Left ventricular ejection fraction, by estimation, is 60 to 65%. The  left ventricle has normal function. The left ventricle has no regional  wall motion abnormalities. There is mild concentric left ventricular  hypertrophy. Left ventricular diastolic  parameters are consistent with Grade I diastolic dysfunction (impaired  relaxation).  2. Right ventricular systolic function is normal. The right ventricular  size is normal. There is normal pulmonary artery systolic pressure. The  estimated right ventricular systolic pressure is 22.2 mmHg.  3. Left atrial size was moderately dilated.  4. The mitral valve is grossly normal. Trivial mitral valve  regurgitation.  5. The aortic valve is tricuspid. Aortic valve regurgitation is not  visualized.  6. The inferior vena cava is normal in size with greater than 50%  respiratory variability, suggesting right atrial pressure of 3 mmHg.    Assessment and Plan: 1. Persistent afib Patient is s/p dofetilide admission 5/24-5/27/22 Continue dofetilide 500  mcg BID. QT stable. Check bmet/mag today. Continue Xarelto 20 mg daily Continue diltiazem 360 mg daily  2. CHA2DS2VASc score of at least 4 Continue  xarelto 20 mg daily   3. OSA He has been referred for [facility] device.   Follow up with Dr Lalla Brothers as scheduled.    Jorja Loa PA-C [facility] [address] [place], [place] [address] [phone]

## 2021-05-29 ENCOUNTER — Other Ambulatory Visit (HOSPITAL_COMMUNITY): Payer: Self-pay | Admitting: *Deleted

## 2021-05-29 MED ORDER — POTASSIUM CHLORIDE ER 10 MEQ PO TBCR: 10.0000 meq | EXTENDED_RELEASE_TABLET | Freq: Every day | ORAL | 3 refills | Status: DC

## 2021-06-19 ENCOUNTER — Ambulatory Visit: Payer: Medicare Other | Admitting: Thoracic Surgery (Cardiothoracic Vascular Surgery)

## 2021-06-23 ENCOUNTER — Other Ambulatory Visit: Payer: Self-pay | Admitting: Physician Assistant

## 2021-06-24 ENCOUNTER — Other Ambulatory Visit: Payer: Self-pay | Admitting: Thoracic Surgery (Cardiothoracic Vascular Surgery)

## 2021-06-24 DIAGNOSIS — Z9889 Other specified postprocedural states: Secondary | ICD-10-CM

## 2021-06-25 ENCOUNTER — Other Ambulatory Visit: Payer: Self-pay | Admitting: Physician Assistant

## 2021-06-26 ENCOUNTER — Ambulatory Visit (INDEPENDENT_AMBULATORY_CARE_PROVIDER_SITE_OTHER): Payer: Self-pay | Admitting: Thoracic Surgery (Cardiothoracic Vascular Surgery)

## 2021-06-26 ENCOUNTER — Other Ambulatory Visit: Payer: Self-pay

## 2021-06-26 ENCOUNTER — Encounter: Payer: Self-pay | Admitting: Thoracic Surgery (Cardiothoracic Vascular Surgery)

## 2021-06-26 ENCOUNTER — Ambulatory Visit
Admission: RE | Admit: 2021-06-26 | Discharge: 2021-06-26 | Disposition: A | Discharge status: Home / Self Care | Payer: Medicare Other | Source: Ambulatory Visit | Attending: Thoracic Surgery (Cardiothoracic Vascular Surgery) | Admitting: Thoracic Surgery (Cardiothoracic Vascular Surgery)

## 2021-06-26 VITALS — BP 147/76 | HR 56 | Resp 20 | Ht 67.0 in | Wt 189.0 lb

## 2021-06-26 DIAGNOSIS — Z09 Encounter for follow-up examination after completed treatment for conditions other than malignant neoplasm: Secondary | ICD-10-CM

## 2021-06-26 DIAGNOSIS — Z8719 Personal history of other diseases of the digestive system: Secondary | ICD-10-CM

## 2021-06-26 NOTE — Progress Notes (Signed)
      [address]       [place] [address]             [phone]        NECO KLING [place] Medical Record #[id number] Date of Birth: [birth year]  Referring: Lanier Prude, MD Primary Care: Marcell Anger, NP Primary Cardiologist:Peter Eden Emms, MD  Reason for visit:   follow-up  History of Present Illness:     [name] comes in for his 1 month follow-up appointment.  Overall he is doing well.  He denies any reflux does not need to take any antacids.  He denies any dysphagia.  Physical Exam: BP (!) 147/76 (BP Location: Right Arm, Patient Position: Sitting)   Pulse (!) 56   Resp 20   Ht 5\' 7"  (1.702 m)   Wt 189 lb (85.7 kg)   SpO2 95% Comment: RA  BMI 29.60 kg/m   Alert NAD Incision clean, well-healed.   Abdomen soft, ND No peripheral edema   Diagnostic Studies & Laboratory data: CXR: Clear     Assessment / Plan:   65 year old male status post robotic assisted paraesophageal hernia repair.  Currently doing well and is asymptomatic.  He will follow-up in 3 months for symptom check virtually.   79 06/26/2021 10:59 AM

## 2021-07-08 NOTE — Progress Notes (Signed)
Electrophysiology Office Follow up Visit Note:    Date:  07/10/2021   ID:  Tanner Rich, DOB 04-May-1956, MRN [id number]  PCP:  Marcell Anger, NP  [facility] Cardiologist:  Charlton Haws, MD  [facility] Electrophysiologist:  Lanier Prude, MD    Interval History:    Tanner Rich is a 65 y.o. male who presents for a follow up visit. They were last seen in clinic on March 23 as a virtual appointment for his paroxysmal AF. At that appointment we initially planned to perform a PVI but given a very large hiatal hernia, the patient was referred to Dr Cliffton Asters for robotic assisted hiatal hernia repair.   The patient had a hernia repair on 04/23/2021.  Since the operation, the patient was successfully started on dofetilide which has done a good job maintaining normal rhythm.   Today, the patient tells me he has been feeling tired for the last 2 weeks.  He has not been ill.  No changes in his medications.     Past Medical History:  Diagnosis Date   A-fib (HCC)    Complication of anesthesia    "waking up, throat closed up"   CVA (cerebral vascular accident) (HCC) 2000   GERD (gastroesophageal reflux disease)    History of kidney stones    HLD (hyperlipidemia)    HTN (hypertension)    Obesity    OSA (obstructive sleep apnea)    OSA (obstructive sleep apnea) 05/07/2015   Right carotid artery occlusion    Chronic RICA occlusion, no stenosis LICA on 01/26/21 carotid Duplex    Past Surgical History:  Procedure Laterality Date   CARDIOVERSION N/A 06/03/2015   Procedure: CARDIOVERSION;  Surgeon: Wendall Stade, MD;  Location: [facility] ENDOSCOPY;  Service: Cardiovascular;  Laterality: N/A;   CARDIOVERSION N/A 05/11/2017   Procedure: CARDIOVERSION;  Surgeon: Wendall Stade, MD;  Location: [facility] ENDOSCOPY;  Service: Cardiovascular;  Laterality: N/A;   CARDIOVERSION N/A 04/11/2020   Procedure: CARDIOVERSION;  Surgeon: Pricilla Riffle, MD;  Location: [facility] ENDOSCOPY;  Service: Cardiovascular;   Laterality: N/A;   COLONOSCOPY     ESOPHAGOGASTRODUODENOSCOPY N/A 04/23/2021   Procedure: ESOPHAGOGASTRODUODENOSCOPY (EGD);  Surgeon: Corliss Skains, MD;  Location: [facility] OR;  Service: Thoracic;  Laterality: N/A;   TONSILLECTOMY     XI ROBOTIC ASSISTED PARAESOPHAGEAL HERNIA REPAIR N/A 04/23/2021   Procedure: XI ROBOTIC ASSISTED PARAESOPHAGEAL HERNIA REPAIR WITH FUNDOPLICATION USING ACELL GENTRIX SURGICAL MATRIX;  Surgeon: Corliss Skains, MD;  Location: [facility] OR;  Service: Thoracic;  Laterality: N/A;    Current Medications: Current Meds  Medication Sig   acetaminophen (TYLENOL) 500 MG tablet Take 2 tablets (1,000 mg total) by mouth every 6 (six) hours.   diltiazem (CARDIZEM CD) 180 MG 24 hr capsule Take 1 capsule (180 mg total) by mouth daily.   dofetilide (TIKOSYN) 500 MCG capsule Take 1 capsule (500 mcg total) by mouth 2 (two) times daily.   potassium chloride (KLOR-CON) 10 MEQ tablet Take 1 tablet (10 mEq total) by mouth daily.   rivaroxaban (XARELTO) 20 MG TABS tablet Take 20 mg by mouth daily.   tamsulosin (FLOMAX) 0.4 MG CAPS capsule Take 0.4 mg by mouth daily.   Vitamin D, Ergocalciferol, (DRISDOL) 1.25 MG (50000 UNIT) CAPS capsule Take 50,000 Units by mouth every 30 (thirty) days.   [DISCONTINUED] diltiazem (CARDIZEM CD) 360 MG 24 hr capsule Take 1 capsule (360 mg total) by mouth daily.     Allergies:   Ciprofloxacin  Social History   Socioeconomic History   Marital status: Married    Spouse name: Not on file   Number of children: Not on file   Years of education: Not on file   Highest education level: Not on file  Occupational History   Not on file  Tobacco Use   Smoking status: Never   Smokeless tobacco: Never  Vaping Use   Vaping Use: Never used  Substance and Sexual Activity   Alcohol use: No    Alcohol/week: 0.0 standard drinks   Drug use: No   Sexual activity: Not on file  Other Topics Concern   Not on file  Social History Narrative   Not on file    Social Determinants of Health   Financial Resource Strain: Not on file  Food Insecurity: Not on file  Transportation Needs: Not on file  Physical Activity: Not on file  Stress: Not on file  Social Connections: Not on file     Family History: The patient's family history includes Atrial fibrillation in his mother; Cancer in his father; Heart disease in his father; Prostate cancer in an other family member.  ROS:   Please see the history of present illness.    All other systems reviewed and are negative.  EKGs/Labs/Other Studies Reviewed:    The following studies were reviewed today:   EKG:  The ekg ordered today demonstrates sinus bradycardia with a ventricular rate of 47 bpm.  Recent Labs: 04/21/2021: ALT 10 05/07/2021: Hemoglobin 10.0; Platelets 491 05/28/2021: BUN 14; Creatinine, Ser 1.01; Magnesium 2.2; Potassium 3.7; Sodium 139  Recent Lipid Panel No results found for: CHOL, TRIG, HDL, CHOLHDL, VLDL, LDLCALC, LDLDIRECT  Physical Exam:    VS:  BP 134/86   Pulse (!) 47   Ht 5\' 7"  (1.702 m)   Wt 191 lb (86.6 kg)   SpO2 93%   BMI 29.91 kg/m     Wt Readings from Last 3 Encounters:  07/10/21 191 lb (86.6 kg)  06/26/21 189 lb (85.7 kg)  05/28/21 188 lb 6.4 oz (85.5 kg)     GEN:  Well nourished, well developed in no acute distress HEENT: Normal NECK: No JVD; No carotid bruits LYMPHATICS: No lymphadenopathy CARDIAC: RRR, no murmurs, rubs, gallops RESPIRATORY:  Clear to auscultation without rales, wheezing or rhonchi  ABDOMEN: Soft, non-tender, non-distended MUSCULOSKELETAL:  No edema; No deformity  SKIN: Warm and dry NEUROLOGIC:  Alert and oriented x 3 PSYCHIATRIC:  Normal affect   ASSESSMENT:    1. Persistent atrial fibrillation (HCC)   2. Paroxysmal atrial fibrillation (HCC)   3. OSA (obstructive sleep apnea)   4. Encounter for long-term (current) use of high-risk medication    PLAN:    In order of problems listed above:  1. Persistent atrial  fibrillation (HCC) Maintaining sinus rhythm on dofetilide 500 mcg by mouth twice daily.  His QTC today is acceptable.  We will get blood work today.  I will plan to see him back in 3 months.  I do think his high-dose Cardizem is contributing to his fatigue.  I will cut the dose in half today.  I discussed ablation with the patient during today's visit.  I discussed the procedure, risks, recovery time and he wishes to proceed.  He would like to wait to schedule the ablation date until November.  In the interim, he will continue Xarelto for stroke prophylaxis and dofetilide.   Risk, benefits, and alternatives to EP study and radiofrequency ablation for afib were also discussed  in detail today. These risks include but are not limited to stroke, bleeding, vascular damage, tamponade, perforation, damage to the esophagus, lungs, and other structures, pulmonary vein stenosis, worsening renal function, and death. The patient understands these risk and wishes to proceed.  We will therefore proceed with catheter ablation at the next available time.  Carto, ICE, anesthesia are requested for the procedure.    2. OSA (obstructive sleep apnea) Does not tolerate CPAP therapy.  4. Encounter for long-term (current) use of high-risk medication QTC stable.  Repeat blood work today.  Follow-up with me in 3 months which is October.      Medication Adjustments/Labs and Tests Ordered: Current medicines are reviewed at length with the patient today.  Concerns regarding medicines are outlined above.  Orders Placed This Encounter  Procedures   Basic Metabolic Panel (BMET)   Magnesium   EKG 12-Lead   Meds ordered this encounter  Medications   diltiazem (CARDIZEM CD) 180 MG 24 hr capsule    Sig: Take 1 capsule (180 mg total) by mouth daily.    Dispense:  90 capsule    Refill:  3     Signed, Steffanie Dunn, MD, [facility] 07/10/2021 9:56 AM    Electrophysiology [facility]

## 2021-07-10 ENCOUNTER — Ambulatory Visit (INDEPENDENT_AMBULATORY_CARE_PROVIDER_SITE_OTHER): Payer: Medicare Other | Admitting: Cardiology

## 2021-07-10 ENCOUNTER — Other Ambulatory Visit: Payer: Self-pay

## 2021-07-10 ENCOUNTER — Encounter: Payer: Self-pay | Admitting: Cardiology

## 2021-07-10 VITALS — BP 134/86 | HR 47 | Ht 67.0 in | Wt 191.0 lb

## 2021-07-10 DIAGNOSIS — G4733 Obstructive sleep apnea (adult) (pediatric): Secondary | ICD-10-CM

## 2021-07-10 DIAGNOSIS — I4819 Other persistent atrial fibrillation: Secondary | ICD-10-CM | POA: Diagnosis not present

## 2021-07-10 DIAGNOSIS — I480 Paroxysmal atrial fibrillation: Secondary | ICD-10-CM

## 2021-07-10 DIAGNOSIS — Z79899 Other long term (current) drug therapy: Secondary | ICD-10-CM

## 2021-07-10 LAB — MAGNESIUM: Magnesium: 2.1 mg/dL (ref 1.6–2.3)

## 2021-07-10 LAB — BASIC METABOLIC PANEL
BUN/Creatinine Ratio: 14 (ref 10–24)
BUN: 14 mg/dL (ref 8–27)
CO2: 24 mmol/L (ref 20–29)
Calcium: 9.3 mg/dL (ref 8.6–10.2)
Chloride: 104 mmol/L (ref 96–106)
Creatinine, Ser: 0.99 mg/dL (ref 0.76–1.27)
Glucose: 72 mg/dL (ref 65–99)
Potassium: 4.4 mmol/L (ref 3.5–5.2)
Sodium: 141 mmol/L (ref 134–144)
eGFR: 83 mL/min/{1.73_m2} (ref >59)

## 2021-07-10 MED ORDER — DILTIAZEM HCL ER COATED BEADS 180 MG PO CP24: 180.0000 mg | ORAL_CAPSULE | Freq: Every day | ORAL | 3 refills | Status: DC

## 2021-07-10 NOTE — Patient Instructions (Addendum)
Medication Instructions:  Your physician has recommended you make the following change in your medication:    REDUCE your diltiazem-  Take 180 mg by mouth once a day  *If you need a refill on your cardiac medications before your next appointment, please call your pharmacy*  Lab Work: You will get lab work today:  BMP and magnesium  If you have labs (blood work) drawn today and your tests are completely normal, you will receive your results only by: MyChart Message (if you have MyChart) OR A paper copy in the mail If you have any lab test that is abnormal or we need to change your treatment, we will call you to review the results.  Testing/Procedures: None ordered.  Follow-Up: At [facility], you and your health needs are our priority.  As part of our continuing mission to provide you with exceptional heart care, we have created designated Provider Care Teams.  These Care Teams include your primary Cardiologist (physician) and Advanced Practice Providers (APPs -  Physician Assistants and Nurse Practitioners) who all work together to provide you with the care you need, when you need it.  Your next appointment:   Your physician wants you to follow-up in: 3 months with Dr. Lalla Brothers.     October 09, 2021 at 8;00 am

## 2021-08-24 ENCOUNTER — Other Ambulatory Visit (HOSPITAL_COMMUNITY): Payer: Self-pay | Admitting: Physician Assistant

## 2021-09-25 ENCOUNTER — Telehealth: Payer: Medicare Other | Admitting: Thoracic Surgery (Cardiothoracic Vascular Surgery)

## 2021-09-25 ENCOUNTER — Other Ambulatory Visit: Payer: Self-pay

## 2021-09-25 ENCOUNTER — Ambulatory Visit (INDEPENDENT_AMBULATORY_CARE_PROVIDER_SITE_OTHER): Payer: Medicare Other | Admitting: Thoracic Surgery (Cardiothoracic Vascular Surgery)

## 2021-09-25 DIAGNOSIS — Z9889 Other specified postprocedural states: Secondary | ICD-10-CM

## 2021-09-25 DIAGNOSIS — Z8719 Personal history of other diseases of the digestive system: Secondary | ICD-10-CM | POA: Diagnosis not present

## 2021-09-25 NOTE — Progress Notes (Signed)
     [address]       [place] [address]             [phone]       Patient: Home Provider: Office Consent for Telemedicine visit obtained.  Today's visit was completed via a real-time telehealth (see specific modality noted below). The patient/authorized person provided oral consent at the time of the visit to engage in a telemedicine encounter with the present provider at [facility]. The patient/authorized person was informed of the potential benefits, limitations, and risks of telemedicine. The patient/authorized person expressed understanding that the laws that protect confidentiality also apply to telemedicine. The patient/authorized person acknowledged understanding that telemedicine does not provide emergency services and that he or she would need to call 911 or proceed to the nearest hospital for help if such a need arose.   Total time spent in the clinical discussion 10 minutes.  Telehealth Modality: Phone visit (audio only)  I had a telephone visit with Mr. Buhrman.  He is a 65 year old male that underwent a robotic assisted paraesophageal hernia repair in June 2022.  He also has a history of atrial fibrillation and is currently being followed by Dr. Lalla Brothers.  He denies any reflux or dysphagia.  He is eating everything that he wants without difficulty.  He is scheduled to undergo A. fib ablation in December.  Clear from my standpoint.  Follow-up as needed.

## 2021-10-02 ENCOUNTER — Telehealth: Payer: Medicare Other | Admitting: Thoracic Surgery (Cardiothoracic Vascular Surgery)

## 2021-10-06 ENCOUNTER — Other Ambulatory Visit (HOSPITAL_COMMUNITY): Payer: Self-pay | Admitting: Cardiovascular Disease

## 2021-10-06 DIAGNOSIS — I6523 Occlusion and stenosis of bilateral carotid arteries: Secondary | ICD-10-CM

## 2021-10-08 NOTE — Progress Notes (Signed)
Electrophysiology Office Follow up Visit Note:    Date:  10/09/2021   ID:  Tanner Rich, DOB [birth year], MRN [id number]  PCP:  Marcell Anger, NP  [facility] Cardiologist:  Charlton Haws, MD  [facility] Electrophysiologist:  Lanier Prude, MD    Interval History:    Tanner Rich is a 65 y.o. male who presents for a follow up visit. They were last seen in clinic July 10, 2021 for his persistent atrial fibrillation.  He is currently on Xarelto and dofetilide.  He previously had a paraesophageal hernia repair by Dr. Cliffton Asters.  At his last appointment we plan to meet back today to discuss scheduling his ablation.  Today he tells me he is doing well.  He continues to take Tikosyn 500 mcg by mouth twice daily.  He is taking Xarelto for stroke prophylaxis.  He is still interested in pursuing ablation for his atrial fibrillation.     Past Medical History:  Diagnosis Date   A-fib (HCC)    Complication of anesthesia    "waking up, throat closed up"   CVA (cerebral vascular accident) (HCC) 2000   GERD (gastroesophageal reflux disease)    History of kidney stones    HLD (hyperlipidemia)    HTN (hypertension)    Obesity    OSA (obstructive sleep apnea)    OSA (obstructive sleep apnea) 05/07/2015   Right carotid artery occlusion    Chronic RICA occlusion, no stenosis LICA on 01/26/21 carotid Duplex    Past Surgical History:  Procedure Laterality Date   CARDIOVERSION N/A 06/03/2015   Procedure: CARDIOVERSION;  Surgeon: Wendall Stade, MD;  Location: [facility] ENDOSCOPY;  Service: Cardiovascular;  Laterality: N/A;   CARDIOVERSION N/A 05/11/2017   Procedure: CARDIOVERSION;  Surgeon: Wendall Stade, MD;  Location: [facility] ENDOSCOPY;  Service: Cardiovascular;  Laterality: N/A;   CARDIOVERSION N/A 04/11/2020   Procedure: CARDIOVERSION;  Surgeon: Pricilla Riffle, MD;  Location: [facility] ENDOSCOPY;  Service: Cardiovascular;  Laterality: N/A;   COLONOSCOPY     ESOPHAGOGASTRODUODENOSCOPY N/A 04/23/2021    Procedure: ESOPHAGOGASTRODUODENOSCOPY (EGD);  Surgeon: Corliss Skains, MD;  Location[facility] OR;  Service: Thoracic;  Laterality: N/A;   TONSILLECTOMY     XI ROBOTIC ASSISTED PARAESOPHAGEAL HERNIA REPAIR N/A 04/23/2021   Procedure: XI ROBOTIC ASSISTED PARAESOPHAGEAL HERNIA REPAIR WITH FUNDOPLICATION USING ACELL GENTRIX SURGICAL MATRIX;  Surgeon: Corliss Skains, MD;  Location: [facility] OR;  Service: Thoracic;  Laterality: N/A;    Current Medications: Current Meds  Medication Sig   acetaminophen (TYLENOL) 500 MG tablet Take 2 tablets (1,000 mg total) by mouth every 6 (six) hours.   diltiazem (CARDIZEM CD) 180 MG 24 hr capsule Take 1 capsule (180 mg total) by mouth daily.   dofetilide (TIKOSYN) 500 MCG capsule Take 1 capsule (500 mcg total) by mouth 2 (two) times daily.   potassium chloride (KLOR-CON) 10 MEQ tablet TAKE 1 TABLET BY MOUTH EVERY DAY   rivaroxaban (XARELTO) 20 MG TABS tablet Take 20 mg by mouth daily.   tamsulosin (FLOMAX) 0.4 MG CAPS capsule Take 0.4 mg by mouth daily.   Vitamin D, Ergocalciferol, (DRISDOL) 1.25 MG (50000 UNIT) CAPS capsule Take 50,000 Units by mouth every 30 (thirty) days.     Allergies:   Ciprofloxacin   Social History   Socioeconomic History   Marital status: Married    Spouse name: Not on file   Number of children: Not on file   Years of education: Not on file   Highest  education level: Not on file  Occupational History   Not on file  Tobacco Use   Smoking status: Never   Smokeless tobacco: Never  Vaping Use   Vaping Use: Never used  Substance and Sexual Activity   Alcohol use: No    Alcohol/week: 0.0 standard drinks   Drug use: No   Sexual activity: Not on file  Other Topics Concern   Not on file  Social History Narrative   Not on file   Social Determinants of Health   Financial Resource Strain: Not on file  Food Insecurity: Not on file  Transportation Needs: Not on file  Physical Activity: Not on file  Stress: Not on file  Social  Connections: Not on file     Family History: The patient's family history includes Atrial fibrillation in his mother; Cancer in his father; Heart disease in his father; Prostate cancer in an other family member.  ROS:   Please see the history of present illness.    All other systems reviewed and are negative.  EKGs/Labs/Other Studies Reviewed:    The following studies were reviewed today:   EKG:  The ekg ordered today demonstrates sinus rhythm.  QTC is 450 ms.  Recent Labs: 04/21/2021: ALT 10 05/07/2021: Hemoglobin 10.0; Platelets 491 07/10/2021: BUN 14; Creatinine, Ser 0.99; Magnesium 2.1; Potassium 4.4; Sodium 141  Recent Lipid Panel No results found for: CHOL, TRIG, HDL, CHOLHDL, VLDL, LDLCALC, LDLDIRECT  Physical Exam:    VS:  BP (!) 152/90   Pulse (!) 52   Ht 5\' 7"  (1.702 m)   Wt 189 lb (85.7 kg)   BMI 29.60 kg/m     Wt Readings from Last 3 Encounters:  10/09/21 189 lb (85.7 kg)  07/10/21 191 lb (86.6 kg)  06/26/21 189 lb (85.7 kg)     GEN:  Well nourished, well developed in no acute distress HEENT: Normal NECK: No JVD; No carotid bruits LYMPHATICS: No lymphadenopathy CARDIAC: RRR, no murmurs, rubs, gallops RESPIRATORY:  Clear to auscultation without rales, wheezing or rhonchi  ABDOMEN: Soft, non-tender, non-distended MUSCULOSKELETAL:  No edema; No deformity  SKIN: Warm and dry NEUROLOGIC:  Alert and oriented x 3 PSYCHIATRIC:  Normal affect        ASSESSMENT:    1. Persistent atrial fibrillation (HCC)   2. OSA (obstructive sleep apnea)   3. Encounter for long-term (current) use of high-risk medication   4. Atrial fibrillation, unspecified type (HCC)    PLAN:    In order of problems listed above:   1. Persistent atrial fibrillation (HCC) Maintaining normal rhythm on dofetilide.  He is interested in ablation.  If we have a good ablation outcome, could consider weaning medications down the road.  I discussed the ablation procedure in detail with the  patient again during today's visit include the risk recovery and efficacy and he wishes to proceed.  Risk, benefits, and alternatives to EP study and radiofrequency ablation for afib were also discussed in detail today. These risks include but are not limited to stroke, bleeding, vascular damage, tamponade, perforation, damage to the esophagus, lungs, and other structures, pulmonary vein stenosis, worsening renal function, and death. The patient understands these risk and wishes to proceed.  We will therefore proceed with catheter ablation at the next available time.  Carto, ICE, anesthesia are requested for the procedure.  Will also obtain CT PV protocol prior to the procedure to exclude LAA thrombus and further evaluate atrial anatomy.   2. OSA (obstructive sleep apnea) CPAP  encouraged  3. Encounter for long-term (current) use of high-risk medication QTC stable.  4. Atrial fibrillation, unspecified type (HCC) See #1    Medication Adjustments/Labs and Tests Ordered: Current medicines are reviewed at length with the patient today.  Concerns regarding medicines are outlined above.  Orders Placed This Encounter  Procedures   CT CARDIAC MORPH/PULM VEIN W/CM&W/O CA SCORE   Basic Metabolic Panel (BMET)   CBC w/Diff   EKG 12-Lead    No orders of the defined types were placed in this encounter.    Signed, Steffanie Dunn, MD, [facility], [facility] 10/09/2021 8:17 AM    Electrophysiology [facility]

## 2021-10-09 ENCOUNTER — Encounter: Payer: Self-pay | Admitting: Cardiology

## 2021-10-09 ENCOUNTER — Ambulatory Visit (INDEPENDENT_AMBULATORY_CARE_PROVIDER_SITE_OTHER): Payer: Medicare Other | Admitting: Cardiology

## 2021-10-09 ENCOUNTER — Other Ambulatory Visit: Payer: Self-pay

## 2021-10-09 VITALS — BP 152/90 | HR 52 | Ht 67.0 in | Wt 189.0 lb

## 2021-10-09 DIAGNOSIS — Z79899 Other long term (current) drug therapy: Secondary | ICD-10-CM | POA: Diagnosis not present

## 2021-10-09 DIAGNOSIS — G4733 Obstructive sleep apnea (adult) (pediatric): Secondary | ICD-10-CM

## 2021-10-09 DIAGNOSIS — I4891 Unspecified atrial fibrillation: Secondary | ICD-10-CM | POA: Diagnosis not present

## 2021-10-09 DIAGNOSIS — I4819 Other persistent atrial fibrillation: Secondary | ICD-10-CM

## 2021-10-09 NOTE — Patient Instructions (Signed)
Medication Instructions:  Your physician recommends that you continue on your current medications as directed. Please refer to the Current Medication list given to you today.  Labwork: None ordered.  Testing/Procedures: None ordered.  Follow-Up:  SEE INSTRUCTION LETTER  Any Other Special Instructions Will Be Listed Below (If Applicable).  If you need a refill on your cardiac medications before your next appointment, please call your pharmacy.   Cardiac Ablation Cardiac ablation is a procedure to destroy, or ablate, a small amount of heart tissue in very specific places. The heart has many electrical connections. Sometimes these connections are abnormal and can cause the heart to beat very fast or irregularly. Ablating some of the areas that cause problems can improve the heart's rhythm or return it to normal. Ablation may be done for people who: Have Wolff-Parkinson-White syndrome. Have fast heart rhythms (tachycardia). Have taken medicines for an abnormal heart rhythm (arrhythmia) that were not effective or caused side effects. Have a high-risk heartbeat that may be life-threatening. During the procedure, a small incision is made in the neck or the groin, and a long, thin tube (catheter) is inserted into the incision and moved to the heart. Small devices (electrodes) on the tip of the catheter will send out electrical currents. A type of X-ray (fluoroscopy) will be used to help guide the catheter and to provide images of the heart. Tell a health care provider about: Any allergies you have. All medicines you are taking, including vitamins, herbs, eye drops, creams, and over-the-counter medicines. Any problems you or family members have had with anesthetic medicines. Any blood disorders you have. Any surgeries you have had. Any medical conditions you have, such as kidney failure. Whether you are pregnant or may be pregnant. What are the risks? Generally, this is a safe procedure.  However, problems may occur, including: Infection. Bruising and bleeding at the catheter insertion site. Bleeding into the chest, especially into the sac that surrounds the heart. This is a serious complication. Stroke or blood clots. Damage to nearby structures or organs. Allergic reaction to medicines or dyes. Need for a permanent pacemaker if the normal electrical system is damaged. A pacemaker is a small computer that sends electrical signals to the heart and helps your heart beat normally. The procedure not being fully effective. This may not be recognized until months later. Repeat ablation procedures are sometimes done. What happens before the procedure? Medicines Ask your health care provider about: Changing or stopping your regular medicines. This is especially important if you are taking diabetes medicines or blood thinners. Taking medicines such as aspirin and ibuprofen. These medicines can thin your blood. Do not take these medicines unless your health care provider tells you to take them. Taking over-the-counter medicines, vitamins, herbs, and supplements. General instructions Follow instructions from your health care provider about eating or drinking restrictions. Plan to have someone take you home from the hospital or clinic. If you will be going home right after the procedure, plan to have someone with you for 24 hours. Ask your health care provider what steps will be taken to prevent infection. What happens during the procedure?  An IV will be inserted into one of your veins. You will be given a medicine to help you relax (sedative). The skin on your neck or groin will be numbed. An incision will be made in your neck or your groin. A needle will be inserted through the incision and into a large vein in your neck or groin. A catheter will   be inserted into the needle and moved to your heart. Dye may be injected through the catheter to help your surgeon see the area of the  heart that needs treatment. Electrical currents will be sent from the catheter to ablate heart tissue in desired areas. There are three types of energy that may be used to do this: Heat (radiofrequency energy). Laser energy. Extreme cold (cryoablation). When the tissue has been ablated, the catheter will be removed. Pressure will be held on the insertion area to prevent a lot of bleeding. A bandage (dressing) will be placed over the insertion area. The exact procedure may vary among health care providers and hospitals. What happens after the procedure? Your blood pressure, heart rate, breathing rate, and blood oxygen level will be monitored until you leave the hospital or clinic. Your insertion area will be monitored for bleeding. You will need to lie still for a few hours to ensure that you do not bleed from the insertion area. Do not drive for 24 hours or as long as told by your health care provider. Summary Cardiac ablation is a procedure to destroy, or ablate, a small amount of heart tissue using an electrical current. This procedure can improve the heart rhythm or return it to normal. Tell your health care provider about any medical conditions you may have and all medicines you are taking to treat them. This is a safe procedure, but problems may occur. Problems may include infection, bruising, damage to nearby organs or structures, or allergic reactions to medicines. Follow your health care provider's instructions about eating and drinking before the procedure. You may also be told to change or stop some of your medicines. After the procedure, do not drive for 24 hours or as long as told by your health care provider. This information is not intended to replace advice given to you by your health care provider. Make sure you discuss any questions you have with your health care provider. Document Revised: 10/22/2019 Document Reviewed: 10/22/2019 Elsevier Patient Education  2022 Elsevier  Inc.    

## 2021-10-23 ENCOUNTER — Other Ambulatory Visit: Payer: Medicare Other | Admitting: *Deleted

## 2021-10-23 ENCOUNTER — Other Ambulatory Visit: Payer: Self-pay

## 2021-10-23 DIAGNOSIS — G4733 Obstructive sleep apnea (adult) (pediatric): Secondary | ICD-10-CM

## 2021-10-23 DIAGNOSIS — I4891 Unspecified atrial fibrillation: Secondary | ICD-10-CM

## 2021-10-23 DIAGNOSIS — Z79899 Other long term (current) drug therapy: Secondary | ICD-10-CM

## 2021-10-23 DIAGNOSIS — I4819 Other persistent atrial fibrillation: Secondary | ICD-10-CM

## 2021-10-24 LAB — CBC WITH DIFFERENTIAL/PLATELET
Basophils Absolute: 0 10*3/uL (ref 0.0–0.2)
Basos: 1 %
EOS (ABSOLUTE): 0.2 10*3/uL (ref 0.0–0.4)
Eos: 3 %
Hematocrit: 41.4 % (ref 37.5–51.0)
Hemoglobin: 13.5 g/dL (ref 13.0–17.7)
Immature Grans (Abs): 0 10*3/uL (ref 0.0–0.1)
Immature Granulocytes: 0 %
Lymphocytes Absolute: 1.9 10*3/uL (ref 0.7–3.1)
Lymphs: 33 %
MCH: 26.8 pg (ref 26.6–33.0)
MCHC: 32.6 g/dL (ref 31.5–35.7)
MCV: 82 fL (ref 79–97)
Monocytes Absolute: 0.5 10*3/uL (ref 0.1–0.9)
Monocytes: 9 %
Neutrophils Absolute: 3.2 10*3/uL (ref 1.4–7.0)
Neutrophils: 54 %
Platelets: 227 10*3/uL (ref 150–450)
RBC: 5.03 x10E6/uL (ref 4.14–5.80)
RDW: 13.7 % (ref 11.6–15.4)
WBC: 5.8 10*3/uL (ref 3.4–10.8)

## 2021-10-24 LAB — BASIC METABOLIC PANEL
BUN/Creatinine Ratio: 14 (ref 10–24)
BUN: 14 mg/dL (ref 8–27)
CO2: 22 mmol/L (ref 20–29)
Calcium: 9.8 mg/dL (ref 8.6–10.2)
Chloride: 102 mmol/L (ref 96–106)
Creatinine, Ser: 1 mg/dL (ref 0.76–1.27)
Glucose: 86 mg/dL (ref 70–99)
Potassium: 4 mmol/L (ref 3.5–5.2)
Sodium: 142 mmol/L (ref 134–144)
eGFR: 82 mL/min/{1.73_m2} (ref >59)

## 2021-11-06 ENCOUNTER — Telehealth (HOSPITAL_COMMUNITY): Payer: Self-pay | Admitting: *Deleted

## 2021-11-06 NOTE — Telephone Encounter (Signed)
Attempted to call patient regarding upcoming cardiac CT appointment. °Left message on voicemail with name and callback number ° °Tahji [place] RN Navigator Cardiac Imaging °[facility] °[phone] Office °[phone] Cell ° °

## 2021-11-09 ENCOUNTER — Encounter (HOSPITAL_COMMUNITY): Payer: Self-pay

## 2021-11-09 ENCOUNTER — Ambulatory Visit (HOSPITAL_COMMUNITY)
Admission: RE | Admit: 2021-11-09 | Discharge: 2021-11-09 | Disposition: A | Discharge status: Home / Self Care | Payer: Medicare Other | Source: Ambulatory Visit | Attending: Cardiology | Admitting: Cardiology

## 2021-11-09 ENCOUNTER — Other Ambulatory Visit: Payer: Self-pay

## 2021-11-09 DIAGNOSIS — I4891 Unspecified atrial fibrillation: Secondary | ICD-10-CM

## 2021-11-09 MED ORDER — IOHEXOL 350 MG/ML SOLN
80.0000 mL | Freq: Once | INTRAVENOUS | Status: AC | PRN
  Administered 2021-11-09: 80 mL via INTRAVENOUS

## 2021-11-13 NOTE — Pre-Procedure Instructions (Signed)
Attempted to call patient regarding procedure instructions.  Left voice mail on the following items: Arrival time 0530 Nothing to eat or drink after midnight No meds AM of procedure Responsible person to drive you home and stay with you for 24 hrs  Have you missed any doses of anti-coagulant Xarelto- don't miss any doses, none on Monday

## 2021-11-15 NOTE — Anesthesia Preprocedure Evaluation (Addendum)
Anesthesia Evaluation  Patient identified by MRN, date of birth, ID band Patient awake    Reviewed: Allergy & Precautions, NPO status , Patient's Chart, lab work & pertinent test results  History of Anesthesia Complications (+) history of anesthetic complications  Airway Mallampati: II  TM Distance: >3 FB Neck ROM: Full    Dental no notable dental hx. (+) Dental Advisory Given   Pulmonary sleep apnea ,    Pulmonary exam normal breath sounds clear to auscultation       Cardiovascular hypertension, Pt. on medications (-) angina(-) Past MI and (-) CHF + dysrhythmias Atrial Fibrillation  Rhythm:Regular Rate:Normal  Echo 02/2021 - Left ventricle: The cavity size was normal. Wall Echo thickness wasnormal. Systolic function was normal. The estimated ejection fraction was in the range of 55% to 60%. Wall motion was normal;there were no regional wall motion abnormalities. Dopplerparameters are consistent with abnormal left ventricularrelaxation (grade 1 diastolic dysfunction).   Impressions:  - Normal LV systolic function; mild diastolic dysfunction; trace MR and TR.      Nuclear stress EF: 55%.  The left ventricular ejection fraction is normal (55-65%).  There was no ST segment deviation noted during stress.  The study is normal.  This is a low risk study.   Normal stress nuclear study with no ischemia or infarction; EF 55 with normal wall motion.    Neuro/Psych Stroke in 2000 CVA negative psych ROS   GI/Hepatic GERD  ,  Endo/Other  negative endocrine ROS  Renal/GU negative Renal ROS     Musculoskeletal  (+) Arthritis ,   Abdominal   Peds  Hematology xarelto for afib  Lab Results      Component                Value               Date                      WBC                      5.9                 04/21/2021                HGB                      13.3                04/21/2021                HCT                       42.4                04/21/2021                MCV                      87.4                04/21/2021                PLT                      200                 04/21/2021  Anesthesia Other Findings   Reproductive/Obstetrics                           Anesthesia Physical  Anesthesia Plan  ASA: 3  Anesthesia Plan: General   Post-op Pain Management: Minimal or no pain anticipated   Induction: Intravenous  PONV Risk Score and Plan: 2 and Ondansetron, Dexamethasone, Treatment may vary due to age or medical condition and Diphenhydramine  Airway Management Planned: Oral ETT  Additional Equipment: None  Intra-op Plan:   Post-operative Plan: Extubation in OR  Informed Consent: I have reviewed the patients History and Physical, chart, labs and discussed the procedure including the risks, benefits and alternatives for the proposed anesthesia with the patient or authorized representative who has indicated his/her understanding and acceptance.     Dental advisory given  Plan Discussed with: CRNA  Anesthesia Plan Comments: (PAT note written 04/22/2021 by Myra Gianotti, PA-C. )       Anesthesia Quick Evaluation

## 2021-11-16 ENCOUNTER — Other Ambulatory Visit: Payer: Self-pay

## 2021-11-16 ENCOUNTER — Ambulatory Visit (HOSPITAL_COMMUNITY)
Admission: RE | Admit: 2021-11-16 | Discharge: 2021-11-16 | Disposition: A | Discharge status: Home / Self Care | Payer: Medicare Other | Source: Ambulatory Visit | Attending: Cardiology | Admitting: Cardiology

## 2021-11-16 ENCOUNTER — Ambulatory Visit (HOSPITAL_COMMUNITY): Payer: Medicare Other | Admitting: Certified Registered Nurse Anesthetist

## 2021-11-16 ENCOUNTER — Encounter (HOSPITAL_COMMUNITY)
Admission: RE | Disposition: A | Discharge status: Home / Self Care | Payer: Self-pay | Source: Ambulatory Visit | Attending: Cardiology

## 2021-11-16 ENCOUNTER — Encounter (HOSPITAL_COMMUNITY): Payer: Self-pay | Admitting: Cardiology

## 2021-11-16 DIAGNOSIS — G4733 Obstructive sleep apnea (adult) (pediatric): Secondary | ICD-10-CM | POA: Diagnosis not present

## 2021-11-16 DIAGNOSIS — Z79899 Other long term (current) drug therapy: Secondary | ICD-10-CM | POA: Diagnosis not present

## 2021-11-16 DIAGNOSIS — Z7901 Long term (current) use of anticoagulants: Secondary | ICD-10-CM | POA: Diagnosis not present

## 2021-11-16 DIAGNOSIS — I4819 Other persistent atrial fibrillation: Secondary | ICD-10-CM | POA: Diagnosis present

## 2021-11-16 HISTORY — PX: ATRIAL FIBRILLATION ABLATION: EP1191

## 2021-11-16 LAB — POCT ACTIVATED CLOTTING TIME
Activated Clotting Time: 289 seconds
Activated Clotting Time: 329 seconds
Activated Clotting Time: 306 seconds
Activated Clotting Time: 353 seconds
Activated Clotting Time: 318 seconds

## 2021-11-16 SURGERY — ATRIAL FIBRILLATION ABLATION
Anesthesia: General

## 2021-11-16 MED ORDER — PHENYLEPHRINE 40 MCG/ML (10ML) SYRINGE FOR IV PUSH (FOR BLOOD PRESSURE SUPPORT)
PREFILLED_SYRINGE | INTRAVENOUS | Status: DC | PRN
Start: 2021-11-16 — End: 2021-11-16
  Administered 2021-11-16 (×5): 80 ug via INTRAVENOUS

## 2021-11-16 MED ORDER — HYDRALAZINE HCL 10 MG PO TABS
20.0000 mg | ORAL_TABLET | Freq: Once | ORAL | Status: AC
  Administered 2021-11-16: 20 mg via ORAL
  Filled 2021-11-16 (×2): qty 2

## 2021-11-16 MED ORDER — SODIUM CHLORIDE 0.9 % IV SOLN: INTRAVENOUS | Status: DC

## 2021-11-16 MED ORDER — HEPARIN (PORCINE) IN NACL 1000-0.9 UT/500ML-% IV SOLN
INTRAVENOUS | Status: DC | PRN
  Administered 2021-11-16 (×4): 500 mL

## 2021-11-16 MED ORDER — ONDANSETRON HCL 4 MG/2ML IJ SOLN
INTRAMUSCULAR | Status: DC | PRN
  Administered 2021-11-16: 4 mg via INTRAVENOUS

## 2021-11-16 MED ORDER — ACETAMINOPHEN 325 MG PO TABS
ORAL_TABLET | ORAL | Status: AC
  Filled 2021-11-16: qty 2

## 2021-11-16 MED ORDER — HEPARIN (PORCINE) IN NACL 1000-0.9 UT/500ML-% IV SOLN
INTRAVENOUS | Status: AC
  Filled 2021-11-16: qty 500

## 2021-11-16 MED ORDER — PANTOPRAZOLE SODIUM 40 MG PO TBEC
40.0000 mg | DELAYED_RELEASE_TABLET | Freq: Every day | ORAL | Status: DC
  Administered 2021-11-16: 40 mg via ORAL
  Filled 2021-11-16: qty 1

## 2021-11-16 MED ORDER — PANTOPRAZOLE SODIUM 40 MG PO TBEC: 40.0000 mg | DELAYED_RELEASE_TABLET | Freq: Every day | ORAL | 0 refills | Status: DC

## 2021-11-16 MED ORDER — RIVAROXABAN 20 MG PO TABS
20.0000 mg | ORAL_TABLET | Freq: Every day | ORAL | Status: DC
  Administered 2021-11-16: 20 mg via ORAL
  Filled 2021-11-16: qty 1

## 2021-11-16 MED ORDER — MIDAZOLAM HCL 2 MG/2ML IJ SOLN
INTRAMUSCULAR | Status: DC | PRN
  Administered 2021-11-16: 2 mg via INTRAVENOUS

## 2021-11-16 MED ORDER — ROCURONIUM BROMIDE 10 MG/ML (PF) SYRINGE
PREFILLED_SYRINGE | INTRAVENOUS | Status: DC | PRN
  Administered 2021-11-16: 60 mg via INTRAVENOUS
  Administered 2021-11-16 (×2): 20 mg via INTRAVENOUS

## 2021-11-16 MED ORDER — ISOPROTERENOL HCL 0.2 MG/ML IJ SOLN
INTRAVENOUS | Status: DC | PRN
  Administered 2021-11-16: 2 ug/min via INTRAVENOUS

## 2021-11-16 MED ORDER — SUGAMMADEX SODIUM 200 MG/2ML IV SOLN
INTRAVENOUS | Status: DC | PRN
Start: 2021-11-16 — End: 2021-11-16
  Administered 2021-11-16: 400 mg via INTRAVENOUS

## 2021-11-16 MED ORDER — ACETAMINOPHEN 325 MG PO TABS
650.0000 mg | ORAL_TABLET | ORAL | Status: DC | PRN
  Administered 2021-11-16: 650 mg via ORAL

## 2021-11-16 MED ORDER — PROPOFOL 10 MG/ML IV BOLUS
INTRAVENOUS | Status: DC | PRN
Start: 2021-11-16 — End: 2021-11-16
  Administered 2021-11-16: 130 mg via INTRAVENOUS
  Administered 2021-11-16: 30 mg via INTRAVENOUS

## 2021-11-16 MED ORDER — SODIUM CHLORIDE 0.9 % IV SOLN: 250.0000 mL | INTRAVENOUS | Status: DC | PRN

## 2021-11-16 MED ORDER — PROTAMINE SULFATE 10 MG/ML IV SOLN
INTRAVENOUS | Status: DC | PRN
  Administered 2021-11-16: 35 mg via INTRAVENOUS

## 2021-11-16 MED ORDER — SODIUM CHLORIDE 0.9% FLUSH: 3.0000 mL | INTRAVENOUS | Status: DC | PRN

## 2021-11-16 MED ORDER — LIDOCAINE 2% (20 MG/ML) 5 ML SYRINGE
INTRAMUSCULAR | Status: DC | PRN
  Administered 2021-11-16: 80 mg via INTRAVENOUS

## 2021-11-16 MED ORDER — HEPARIN SODIUM (PORCINE) 1000 UNIT/ML IJ SOLN
INTRAMUSCULAR | Status: DC | PRN
  Administered 2021-11-16 (×2): 3000 [IU] via INTRAVENOUS
  Administered 2021-11-16: 4000 [IU] via INTRAVENOUS
  Administered 2021-11-16: 13000 [IU] via INTRAVENOUS

## 2021-11-16 MED ORDER — EPHEDRINE SULFATE-NACL 50-0.9 MG/10ML-% IV SOSY
PREFILLED_SYRINGE | INTRAVENOUS | Status: DC | PRN
  Administered 2021-11-16: 15 mg via INTRAVENOUS

## 2021-11-16 MED ORDER — ONDANSETRON HCL 4 MG/2ML IJ SOLN: 4.0000 mg | Freq: Four times a day (QID) | INTRAMUSCULAR | Status: DC | PRN

## 2021-11-16 MED ORDER — COLCHICINE 0.6 MG PO TABS: 0.6000 mg | ORAL_TABLET | Freq: Two times a day (BID) | ORAL | 0 refills | Status: DC

## 2021-11-16 MED ORDER — HEPARIN SODIUM (PORCINE) 1000 UNIT/ML IJ SOLN
INTRAMUSCULAR | Status: DC | PRN
  Administered 2021-11-16 (×2): 1000 [IU] via INTRAVENOUS

## 2021-11-16 MED ORDER — FENTANYL CITRATE (PF) 100 MCG/2ML IJ SOLN
INTRAMUSCULAR | Status: DC | PRN
  Administered 2021-11-16 (×2): 50 ug via INTRAVENOUS

## 2021-11-16 MED ORDER — COLCHICINE 0.6 MG PO TABS
0.6000 mg | ORAL_TABLET | Freq: Two times a day (BID) | ORAL | Status: DC
Start: 2021-11-16 — End: 2021-11-16
  Administered 2021-11-16: 0.6 mg via ORAL
  Filled 2021-11-16: qty 1

## 2021-11-16 MED ORDER — ACETAMINOPHEN 500 MG PO TABS
1000.0000 mg | ORAL_TABLET | Freq: Once | ORAL | Status: AC
  Administered 2021-11-16: 1000 mg via ORAL
  Filled 2021-11-16 (×2): qty 2

## 2021-11-16 MED ORDER — DEXAMETHASONE SODIUM PHOSPHATE 10 MG/ML IJ SOLN
INTRAMUSCULAR | Status: DC | PRN
  Administered 2021-11-16: 10 mg via INTRAVENOUS

## 2021-11-16 MED ORDER — ISOPROTERENOL HCL 0.2 MG/ML IJ SOLN
INTRAMUSCULAR | Status: AC
  Filled 2021-11-16: qty 10

## 2021-11-16 MED ORDER — HEPARIN SODIUM (PORCINE) 1000 UNIT/ML IJ SOLN
INTRAMUSCULAR | Status: AC
  Filled 2021-11-16: qty 1

## 2021-11-16 MED ORDER — SODIUM CHLORIDE 0.9% FLUSH: 3.0000 mL | Freq: Two times a day (BID) | INTRAVENOUS | Status: DC

## 2021-11-16 SURGICAL SUPPLY — 18 items
BLANKET WARM UNDERBOD FULL ACC (MISCELLANEOUS) ×2 IMPLANT
CATH OCTARAY 2.0 F 3-3-3-3-3 (CATHETERS) ×1 IMPLANT
CATH S CIRCA THERM PROBE 10F (CATHETERS) ×1 IMPLANT
CATH SMTCH THERMOCOOL SF DF (CATHETERS) ×1 IMPLANT
CATH SOUNDSTAR ECO 8FR (CATHETERS) ×1 IMPLANT
CATH WEB BI DIR CSDF CRV REPRO (CATHETERS) ×1 IMPLANT
CLOSURE PERCLOSE PROSTYLE (VASCULAR PRODUCTS) ×3 IMPLANT
COVER SWIFTLINK CONNECTOR (BAG) ×2 IMPLANT
PACK EP LATEX FREE (CUSTOM PROCEDURE TRAY) ×2
PACK EP LF (CUSTOM PROCEDURE TRAY) ×1 IMPLANT
PAD DEFIB RADIO PHYSIO CONN (PAD) ×2 IMPLANT
PATCH CARTO3 (PAD) ×1 IMPLANT
SHEATH BAYLIS TRANSSEPTAL 98CM (NEEDLE) ×1 IMPLANT
SHEATH CARTO VIZIGO SM CVD (SHEATH) ×1 IMPLANT
SHEATH PINNACLE 8F 10CM (SHEATH) ×2 IMPLANT
SHEATH PINNACLE 9F 10CM (SHEATH) ×1 IMPLANT
SHEATH PROBE COVER 6X72 (BAG) ×1 IMPLANT
TUBING SMART ABLATE COOLFLOW (TUBING) ×1 IMPLANT

## 2021-11-16 NOTE — H&P (Signed)
Electrophysiology Office Follow up Visit Note:     Date:  10/09/2021    ID:  Tanner Rich, DOB 08-31-1956, MRN [id number]   PCP:  Gennette Pac, NP    [facility] Cardiologist:  Jenkins Rouge, MD  [facility] Electrophysiologist:  Vickie Epley, MD      Interval History:     Tanner Rich is a 65 y.o. male who presents for a follow up visit. They were last seen in clinic July 10, 2021 for his persistent atrial fibrillation.  He is currently on Xarelto and dofetilide.  He previously had a paraesophageal hernia repair by Dr. Kipp Brood.  At his last appointment we plan to meet back today to discuss scheduling his ablation.   Today he tells me he is doing well.  He continues to take Tikosyn 500 mcg by mouth twice daily.  He is taking Xarelto for stroke prophylaxis.  He is still interested in pursuing ablation for his atrial fibrillation.     Objective          Past Medical History:  Diagnosis Date   A-fib ([place])     Complication of anesthesia      "waking up, throat closed up"   CVA (cerebral vascular accident) ([place]) 2000   GERD (gastroesophageal reflux disease)     History of kidney stones     HLD (hyperlipidemia)     HTN (hypertension)     Obesity     OSA (obstructive sleep apnea)     OSA (obstructive sleep apnea) 05/07/2015   Right carotid artery occlusion      Chronic RICA occlusion, no stenosis LICA on [id number] carotid Duplex           Past Surgical History:  Procedure Laterality Date   CARDIOVERSION N/A 06/03/2015    Procedure: CARDIOVERSION;  Surgeon: Josue Hector, MD;  Location: [facility];  Service: Cardiovascular;  Laterality: N/A;   CARDIOVERSION N/A 05/11/2017    Procedure: CARDIOVERSION;  Surgeon: Josue Hector, MD;  Location: [facility] ENDOSCOPY;  Service: Cardiovascular;  Laterality: N/A;   CARDIOVERSION N/A 04/11/2020    Procedure: CARDIOVERSION;  Surgeon: Fay Records, MD;  Location: [facility] ENDOSCOPY;  Service: Cardiovascular;  Laterality: N/A;    COLONOSCOPY       ESOPHAGOGASTRODUODENOSCOPY N/A 04/23/2021    Procedure: ESOPHAGOGASTRODUODENOSCOPY (EGD);  Surgeon: Lajuana Matte, MD;  Location: [facility] OR;  Service: Thoracic;  Laterality: N/A;   TONSILLECTOMY       XI ROBOTIC ASSISTED PARAESOPHAGEAL HERNIA REPAIR N/A 04/23/2021    Procedure: XI ROBOTIC ASSISTED PARAESOPHAGEAL HERNIA REPAIR WITH FUNDOPLICATION USING ACELL [place] SURGICAL MATRIX;  Surgeon: Lajuana Matte, MD;  Location: [facility];  Service: Thoracic;  Laterality: N/A;      Current Medications: Active Medications      Current Meds  Medication Sig   acetaminophen (TYLENOL) 500 MG tablet Take 2 tablets (1,000 mg total) by mouth every 6 (six) hours.   diltiazem (CARDIZEM CD) 180 MG 24 hr capsule Take 1 capsule (180 mg total) by mouth daily.   dofetilide (TIKOSYN) 500 MCG capsule Take 1 capsule (500 mcg total) by mouth 2 (two) times daily.   potassium chloride (KLOR-CON) 10 MEQ tablet TAKE 1 TABLET BY MOUTH EVERY DAY   rivaroxaban (XARELTO) 20 MG TABS tablet Take 20 mg by mouth daily.   tamsulosin (FLOMAX) 0.4 MG CAPS capsule Take 0.4 mg by mouth daily.   Vitamin D, Ergocalciferol, (DRISDOL) 1.25 MG (50000 UNIT) CAPS capsule Take 50,000  Units by mouth every 30 (thirty) days.        Allergies:   Ciprofloxacin    Social History         Socioeconomic History   Marital status: Married      Spouse name: Not on file   Number of children: Not on file   Years of education: Not on file   Highest education level: Not on file  Occupational History   Not on file  Tobacco Use   Smoking status: Never   Smokeless tobacco: Never  Vaping Use   Vaping Use: Never used  Substance and Sexual Activity   Alcohol use: No      Alcohol/week: 0.0 standard drinks   Drug use: No   Sexual activity: Not on file  Other Topics Concern   Not on file  Social History Narrative   Not on file    Social Determinants of Health    Financial Resource Strain: Not on file  Food  Insecurity: Not on file  Transportation Needs: Not on file  Physical Activity: Not on file  Stress: Not on file  Social Connections: Not on file      Family History: The patient's family history includes Atrial fibrillation in his mother; Cancer in his father; Heart disease in his father; Prostate cancer in an other family member.   ROS:   Please see the history of present illness.    All other systems reviewed and are negative.   EKGs/Labs/Other Studies Reviewed:     The following studies were reviewed today:     EKG:  The ekg ordered today demonstrates sinus rhythm.  QTC is 450 ms.   Recent Labs: 04/21/2021: ALT 10 05/07/2021: Hemoglobin 10.0; Platelets 491 07/10/2021: BUN 14; Creatinine, Ser 0.99; Magnesium 2.1; Potassium 4.4; Sodium 141  Recent Lipid Panel Labs (Brief)  No results found for: CHOL, TRIG, HDL, CHOLHDL, VLDL, LDLCALC, LDLDIRECT     Physical Exam:     VS:  BP (!) 152/90   Pulse (!) 52   Ht 5\' 7"  (1.702 m)   Wt 189 lb (85.7 kg)   BMI 29.60 kg/m         Wt Readings from Last 3 Encounters:  10/09/21 189 lb (85.7 kg)  07/10/21 191 lb (86.6 kg)  06/26/21 189 lb (85.7 kg)      GEN:  Well nourished, well developed in no acute distress HEENT: Normal NECK: No JVD; No carotid bruits LYMPHATICS: No lymphadenopathy CARDIAC: RRR, no murmurs, rubs, gallops RESPIRATORY:  Clear to auscultation without rales, wheezing or rhonchi  ABDOMEN: Soft, non-tender, non-distended MUSCULOSKELETAL:  No edema; No deformity  SKIN: Warm and dry NEUROLOGIC:  Alert and oriented x 3 PSYCHIATRIC:  Normal affect            Assessment     ASSESSMENT:     1. Persistent atrial fibrillation (HCC)   2. OSA (obstructive sleep apnea)   3. Encounter for long-term (current) use of high-risk medication   4. Atrial fibrillation, unspecified type (HCC)     PLAN:     In order of problems listed above:     1. Persistent atrial fibrillation (HCC) Maintaining normal rhythm on  dofetilide.  He is interested in ablation.  If we have a good ablation outcome, could consider weaning medications down the road.  I discussed the ablation procedure in detail with the patient again during today's visit include the risk recovery and efficacy and he wishes to proceed.   Risk, benefits,  and alternatives to EP study and radiofrequency ablation for afib were also discussed in detail today. These risks include but are not limited to stroke, bleeding, vascular damage, tamponade, perforation, damage to the esophagus, lungs, and other structures, pulmonary vein stenosis, worsening renal function, and death. The patient understands these risk and wishes to proceed.  We will therefore proceed with catheter ablation at the next available time.  Carto, ICE, anesthesia are requested for the procedure.  Will also obtain CT PV protocol prior to the procedure to exclude LAA thrombus and further evaluate atrial anatomy.     2. OSA (obstructive sleep apnea) CPAP encouraged   3. Encounter for long-term (current) use of high-risk medication QTC stable.   4. Atrial fibrillation, unspecified type ([place]) See #1       Medication Adjustments/Labs and Tests Ordered: Current medicines are reviewed at length with the patient today.  Concerns regarding medicines are outlined above.     Orders Placed This Encounter  Procedures   CT CARDIAC MORPH/PULM VEIN W/CM&W/O CA SCORE   Basic Metabolic Panel (BMET)   CBC w/Diff   EKG 12-Lead      No orders of the defined types were placed in this encounter.       Signed, Lars Mage, MD, [facility], [facility] 10/09/2021 8:17 AM    Electrophysiology [facility]        I have seen, examined the patient, and reviewed the above assessment and plan.    Plan for PVI today.   Vickie Epley, MD 11/16/2021 7:09 AM

## 2021-11-16 NOTE — Progress Notes (Signed)
Pt ambulated without difficulty or bleeding.   Discharged home with his wife who will drive and stay with pt x 24 hrs. 

## 2021-11-16 NOTE — Anesthesia Procedure Notes (Signed)
Procedure Name: Intubation Date/Time: 11/16/2021 7:37 AM Performed by: Pearson Grippe, CRNA Pre-anesthesia Checklist: Patient identified, Emergency Drugs available, Suction available and Patient being monitored Patient Re-evaluated:Patient Re-evaluated prior to induction Oxygen Delivery Method: Circle system utilized Preoxygenation: Pre-oxygenation with 100% oxygen Induction Type: IV induction Ventilation: Mask ventilation without difficulty Laryngoscope Size: Miller and 2 Grade View: Grade II Tube type: Oral Tube size: 7.5 mm Number of attempts: 1 Airway Equipment and Method: Stylet Placement Confirmation: ETT inserted through vocal cords under direct vision, positive ETCO2 and breath sounds checked- equal and bilateral Secured at: 23 cm Tube secured with: Tape Dental Injury: Teeth and Oropharynx as per pre-operative assessment

## 2021-11-16 NOTE — Discharge Instructions (Addendum)
Post procedure care instructions No driving for 4 days. No lifting over 5 lbs for 1 week. No vigorous or sexual activity for 1 week. You may return to work/your usual activities on 11/24/21. Keep procedure site clean & dry. If you notice increased pain, swelling, bleeding or pus, call/return!  You may shower after 24 hours, but no soaking in baths/hot tubs/pools for 1 week.    You have an appointment set up with the Atrial Fibrillation Clinic.  Multiple studies have shown that being followed by a dedicated atrial fibrillation clinic in addition to the standard care you receive from your other physicians improves health. We believe that enrollment in the atrial fibrillation clinic will allow [place] to better care for you.   The phone number to the Atrial Fibrillation Clinic is [phone]. The clinic is staffed Monday through Friday from 8:30am to 5pm.  Parking Directions: The clinic is located in the Heart and Vascular Building connected to [facility]. 1)From [address] turn on to [address] and go to the 3rd entrance  (Heart and Vascular entrance) on the right. 2)Look to the right for Heart &Vascular Parking Garage. 3)A code for the entrance is required, for Dec is 5555.   4)Take the elevators to the 1st floor. Registration is in the room with the glass walls at the end of the hallway.  If you have any trouble parking or locating the clinic, please don't hesitate to call [phone].   Cardiac Ablation, Care After  This sheet gives you information about how to care for yourself after your procedure. Your health care provider may also give you more specific instructions. If you have problems or questions, contact your health care provider. What can I expect after the procedure? After the procedure, it is common to have: Bruising around your puncture site. Tenderness around your puncture site. Skipped heartbeats. Tiredness (fatigue).  Follow these instructions at  home: Puncture site care  Follow instructions from your health care provider about how to take care of your puncture site. Make sure you: If present, leave stitches (sutures), skin glue, or adhesive strips in place. These skin closures may need to stay in place for up to 2 weeks. If adhesive strip edges start to loosen and curl up, you may trim the loose edges. Do not remove adhesive strips completely unless your health care provider tells you to do that. If a large square bandage is present, this may be removed 24 hours after surgery.  Check your puncture site every day for signs of infection. Check for: Redness, swelling, or pain. Fluid or blood. If your puncture site starts to bleed, lie down on your back, apply firm pressure to the area, and contact your health care provider. Warmth. Pus or a bad smell. Driving Do not drive for at least 4 days after your procedure or however long your health care provider recommends. (Do not resume driving if you have previously been instructed not to drive for other health reasons.) Do not drive or use heavy machinery while taking prescription pain medicine. Activity Avoid activities that take a lot of effort for at least 7 days after your procedure. Do not lift anything that is heavier than 5 lb (4.5 kg) for one week.  No sexual activity for 1 week.  Return to your normal activities as told by your health care provider. Ask your health care provider what activities are safe for you. General instructions Take over-the-counter and prescription medicines only as told by your health care  provider. Do not use any products that contain nicotine or tobacco, such as cigarettes and e-cigarettes. If you need help quitting, ask your health care provider. You may shower after 24 hours, but Do not take baths, swim, or use a hot tub for 1 week.  Do not drink alcohol for 24 hours after your procedure. Keep all follow-up visits as told by your health care provider. This  is important. Contact a health care provider if: You have redness, mild swelling, or pain around your puncture site. You have fluid or blood coming from your puncture site that stops after applying firm pressure to the area. Your puncture site feels warm to the touch. You have pus or a bad smell coming from your puncture site. You have a fever. You have chest pain or discomfort that spreads to your neck, jaw, or arm. You are sweating a lot. You feel nauseous. You have a fast or irregular heartbeat. You have shortness of breath. You are dizzy or light-headed and feel the need to lie down. You have pain or numbness in the arm or leg closest to your puncture site. Get help right away if: Your puncture site suddenly swells. Your puncture site is bleeding and the bleeding does not stop after applying firm pressure to the area. These symptoms may represent a serious problem that is an emergency. Do not wait to see if the symptoms will go away. Get medical help right away. Call your local emergency services (911 in the [place] Do not drive yourself to the hospital. Summary After the procedure, it is normal to have bruising and tenderness at the puncture site in your groin, neck, or forearm. Check your puncture site every day for signs of infection. Get help right away if your puncture site is bleeding and the bleeding does not stop after applying firm pressure to the area. This is a medical emergency. This information is not intended to replace advice given to you by your health care provider. Make sure you discuss any questions you have with your health care provider.

## 2021-11-16 NOTE — Transfer of Care (Signed)
Immediate Anesthesia Transfer of Care Note  Patient: Tanner Rich  Procedure(s) Performed: ATRIAL FIBRILLATION ABLATION  Patient Location: Cath Lab  Anesthesia Type:General  Level of Consciousness: awake, alert  and oriented  Airway & Oxygen Therapy: Patient Spontanous Breathing and Patient connected to nasal cannula oxygen  Post-op Assessment: Report given to RN and Post -op Vital signs reviewed and stable  Post vital signs: Reviewed and stable  Last Vitals:  Vitals Value Taken Time  BP 149/79 11/16/21 1152  Temp    Pulse 86 11/16/21 1156  Resp 14 11/16/21 1156  SpO2 99 % 11/16/21 1156  Vitals shown include unvalidated device data.  Last Pain:  Vitals:   11/16/21 0601  TempSrc:   PainSc: 0-No pain         Complications: No notable events documented.

## 2021-11-17 ENCOUNTER — Encounter (HOSPITAL_COMMUNITY): Payer: Self-pay | Admitting: Cardiology

## 2021-11-17 NOTE — Anesthesia Postprocedure Evaluation (Signed)
Anesthesia Post Note  Patient: Tanner Rich  Procedure(s) Performed: ATRIAL FIBRILLATION ABLATION     Patient location during evaluation: PACU Anesthesia Type: General Level of consciousness: sedated and patient cooperative Pain management: pain level controlled Vital Signs Assessment: post-procedure vital signs reviewed and stable Respiratory status: spontaneous breathing Cardiovascular status: stable Anesthetic complications: no   No notable events documented.  Last Vitals:  Vitals:   11/16/21 1640 11/16/21 1700  BP: (!) 178/107 (!) 177/99  Pulse: 78 78  Resp: (!) 23 20  Temp:    SpO2: 96% 96%    Last Pain:  Vitals:   11/16/21 1319  TempSrc:   PainSc: 8                  Taryn Shellhammer [employer]

## 2021-11-23 ENCOUNTER — Ambulatory Visit (HOSPITAL_COMMUNITY)
Admission: RE | Admit: 2021-11-23 | Discharge: 2021-11-23 | Disposition: A | Discharge status: Home / Self Care | Payer: Medicare Other | Source: Ambulatory Visit | Attending: Physician Assistant | Admitting: Physician Assistant

## 2021-11-23 ENCOUNTER — Other Ambulatory Visit: Payer: Self-pay

## 2021-11-23 ENCOUNTER — Telehealth: Payer: Self-pay | Admitting: Cardiology

## 2021-11-23 VITALS — BP 160/64 | HR 95 | Ht 67.0 in | Wt 187.2 lb

## 2021-11-23 DIAGNOSIS — Z7901 Long term (current) use of anticoagulants: Secondary | ICD-10-CM | POA: Diagnosis not present

## 2021-11-23 DIAGNOSIS — I4819 Other persistent atrial fibrillation: Secondary | ICD-10-CM | POA: Insufficient documentation

## 2021-11-23 DIAGNOSIS — D6869 Other thrombophilia: Secondary | ICD-10-CM

## 2021-11-23 DIAGNOSIS — G4733 Obstructive sleep apnea (adult) (pediatric): Secondary | ICD-10-CM | POA: Diagnosis not present

## 2021-11-23 DIAGNOSIS — Z79899 Other long term (current) drug therapy: Secondary | ICD-10-CM | POA: Insufficient documentation

## 2021-11-23 LAB — BASIC METABOLIC PANEL
Anion gap: 8 (ref 5–15)
BUN: 17 mg/dL (ref 8–23)
CO2: 24 mmol/L (ref 22–32)
Calcium: 9.4 mg/dL (ref 8.9–10.3)
Chloride: 104 mmol/L (ref 98–111)
Creatinine, Ser: 1.11 mg/dL (ref 0.61–1.24)
GFR, Estimated: >60 mL/min (ref >60)
Glucose, Bld: 232 mg/dL — ABNORMAL HIGH (ref 70–99)
Potassium: 3.7 mmol/L (ref 3.5–5.1)
Sodium: 136 mmol/L (ref 135–145)

## 2021-11-23 LAB — MAGNESIUM: Magnesium: 2.1 mg/dL (ref 1.7–2.4)

## 2021-11-23 MED ORDER — METOPROLOL SUCCINATE ER 25 MG PO TB24: 25.0000 mg | ORAL_TABLET | Freq: Two times a day (BID) | ORAL | 3 refills | Status: DC

## 2021-11-23 MED ORDER — DOFETILIDE 250 MCG PO CAPS: 250.0000 ug | ORAL_CAPSULE | Freq: Two times a day (BID) | ORAL | 3 refills | Status: DC

## 2021-11-23 NOTE — Telephone Encounter (Signed)
Patient states his BP has been up and irregular heart beats since Wednesday. Per Jorja Loa PA will bring in for assessment. Appt made

## 2021-11-23 NOTE — Progress Notes (Signed)
Primary Care Physician: Marcell Anger, NP Referring Physician: Dr. Brooke Pace is a 65 y.o. male with a h/o persistent  afib that failed flecainide and was referred to Dr. Lalla Brothers 01/2021,  to be considered for ablation. The imaging needed for ablation revealed a large Hiatal hernia. Ablation plans were  discontinued  due to safety concerns of burning  left atrium with HH comprising  Left atrium. Flecainide was discontinued. The original plan was to have tikosyn admit then [facility] repair but the timing did not work out that way. He is now around 2 weeks s/p hernia repair. He is here to discuss Tikosyn admit. He restarted anticoagulation 4/29, after  missing one day pre op.   No benadryl use. Qtc acceptable. Discussed price of drug. He plans to use good rx. He is rate controlled but  is bothered with fatigue and she shortness of  breath with exertion. He was seen by EP during his recent surgery and diltiazem was increased to 360 mg daily.  I did discuss with Dr. Lalla Brothers  going ahead with tikosyn admit vrs ablation and he wants him to go ahead with tikosyn admit for now but he will ablate down the road when he has fully  recovered from his surgery.  Follow up 05/19/21 in the AF clinic. Patient presents for dofetilide admission. He continues to have SOB with exertion. He denies any bleeding issues on anticoagulation and has not missed any doses in the last 3 weeks.    Follow up in the AF clinic 05/28/21. Patient is s/p dofetilide loading 5/24-5/27/22. He converted with the medication and did not require DCCV. He did have an episode of afib on 05/25/21 which lasted about 6 hours. He denies any bleeding issues on anticoagulation.   Follow up in the AF clinic 11/23/21. Patient underwent afib ablation 11/16/21 with Dr Lalla Brothers. Since 11/18/21, patient has noted any irregular heart beat associated with dizziness. His BP machine at home showed an irregular rhythm. ECG today shows aberrantly conducting  PACs in bigeminal pattern.   Today, he denies symptoms of chest pain, orthopnea, PND, lower extremity edema, presyncope, syncope, or neurologic sequela. The patient is tolerating medications without difficulties and is otherwise without complaint today.   Past Medical History:  Diagnosis Date   A-fib (HCC)    Complication of anesthesia    "waking up, throat closed up"   CVA (cerebral vascular accident) (HCC) 2000   GERD (gastroesophageal reflux disease)    History of kidney stones    HLD (hyperlipidemia)    HTN (hypertension)    Obesity    OSA (obstructive sleep apnea)    OSA (obstructive sleep apnea) 05/07/2015   Right carotid artery occlusion    Chronic RICA occlusion, no stenosis LICA on 01/26/21 carotid Duplex   Past Surgical History:  Procedure Laterality Date   ATRIAL FIBRILLATION ABLATION N/A 11/16/2021   Procedure: ATRIAL FIBRILLATION ABLATION;  Surgeon: Lanier Prude, MD;  [facility] IN[facility];  Service: Cardiovascular;  Laterality: N/A;   CARDIOVERSION N/A 06/03/2015   Procedure: CARDIOVERSION;  Surgeon: Wendall Stade, MD;  Location: [facility] ENDOSCOPY;  Service: Cardiovascular;  Laterality: N/A;   CARDIOVERSION N/A 05/11/2017   Procedure: CARDIOVERSION;  Surgeon: Wendall Stade, MD;  Location: [facility] ENDOSCOPY;  Service: Cardiovascular;  Laterality: N/A;   CARDIOVERSION N/A 04/11/2020   Procedure: CARDIOVERSION;  Surgeon: Pricilla Riffle, MD;  Location: [facility] ENDOSCOPY;  Service: Cardiovascular;  Laterality: N/A;   COLONOSCOPY  ESOPHAGOGASTRODUODENOSCOPY N/A 04/23/2021   Procedure: ESOPHAGOGASTRODUODENOSCOPY (EGD);  Surgeon: Lajuana Matte, MD;  Location: [facility] OR;  Service: Thoracic;  Laterality: N/A;   TONSILLECTOMY     XI ROBOTIC ASSISTED PARAESOPHAGEAL HERNIA REPAIR N/A 04/23/2021   Procedure: XI ROBOTIC ASSISTED PARAESOPHAGEAL HERNIA REPAIR WITH FUNDOPLICATION USING ACELL GENTRIX SURGICAL MATRIX;  Surgeon: Lajuana Matte, MD;  Location: [facility] OR;  Service:  Thoracic;  Laterality: N/A;    Current Outpatient Medications  Medication Sig Dispense Refill   acetaminophen (TYLENOL) 500 MG tablet Take 2 tablets (1,000 mg total) by mouth every 6 (six) hours. 30 tablet 0   enalapril (VASOTEC) 20 MG tablet Take 20 mg by mouth daily.     metoprolol succinate (TOPROL XL) 25 MG 24 hr tablet Take 1 tablet (25 mg total) by mouth 2 (two) times daily. 60 tablet 3   potassium chloride (KLOR-CON) 10 MEQ tablet TAKE 1 TABLET BY MOUTH EVERY DAY 90 tablet 3   rivaroxaban (XARELTO) 20 MG TABS tablet Take 20 mg by mouth daily.     Vitamin D, Ergocalciferol, (DRISDOL) 1.25 MG (50000 UNIT) CAPS capsule Take 50,000 Units by mouth every 30 (thirty) days.     dofetilide (TIKOSYN) 250 MCG capsule Take 1 capsule (250 mcg total) by mouth 2 (two) times daily. 60 capsule 3   pantoprazole (PROTONIX) 40 MG tablet Take 1 tablet (40 mg total) by mouth daily. (Patient not taking: Reported on 11/23/2021) 45 tablet 0   No current facility-administered medications for this encounter.    Allergies  Allergen Reactions   Ciprofloxacin Swelling and Other (See Comments)    Pt states he took med for 3 days and ankle swelling started.      Social History   Socioeconomic History   Marital status: Married    Spouse name: Not on file   Number of children: Not on file   Years of education: Not on file   Highest education level: Not on file  Occupational History   Not on file  Tobacco Use   Smoking status: Never   Smokeless tobacco: Never  Vaping Use   Vaping Use: Never used  Substance and Sexual Activity   Alcohol use: No    Alcohol/week: 0.0 standard drinks   Drug use: No   Sexual activity: Not on file  Other Topics Concern   Not on file  Social History Narrative   Not on file   Social Determinants of Health   Financial Resource Strain: Not on file  Food Insecurity: Not on file  Transportation Needs: Not on file  Physical Activity: Not on file  Stress: Not on file   Social Connections: Not on file  Intimate Partner Violence: Not on file    Family History  Problem Relation Age of Onset   Atrial fibrillation Mother    Cancer Father        BONE   Heart disease Father    Prostate cancer Other     ROS- All systems are reviewed and negative except as per the HPI above  Physical Exam: Vitals:   11/23/21 1333  BP: (!) 160/64  Pulse: 95  Weight: 84.9 kg  Height: 5\' 7"  (1.702 m)    Wt Readings from Last 3 Encounters:  11/23/21 84.9 kg  11/16/21 88.9 kg  10/09/21 85.7 kg    Labs: Lab Results  Component Value Date   NA 136 11/23/2021   K 3.7 11/23/2021   CL 104 11/23/2021   CO2 24 11/23/2021  GLUCOSE 232 (H) 11/23/2021   BUN 17 11/23/2021   CREATININE 1.11 11/23/2021   CALCIUM 9.4 11/23/2021   MG 2.1 11/23/2021   Lab Results  Component Value Date   INR 1.2 04/21/2021   No results found for: CHOL, HDL, LDLCALC, TRIG  GEN- The patient is a well appearing male, alert and oriented x 3 today.   HEENT-head normocephalic, atraumatic, sclera clear, conjunctiva pink, hearing intact, trachea midline. Lungs- Clear to ausculation bilaterally, normal work of breathing Heart- irregular rate and rhythm, no murmurs, rubs or gallops  GI- soft, NT, ND, + BS Extremities- no clubbing, cyanosis, or edema MS- no significant deformity or atrophy Skin- no rash or lesion Psych- euthymic mood, full affect Neuro- strength and sensation are intact   EKG today demonstrated  SR, bigeminal aberrantly conducting PACs (Ashman's phenomenon) Vent. rate 95 BPM PR interval 142 ms QRS duration 102 ms QT/QTcB 406/510 ms   Echo 03/06/21 demonstrated   1. Left ventricular ejection fraction, by estimation, is 60 to 65%. The  left ventricle has normal function. The left ventricle has no regional  wall motion abnormalities. There is mild concentric left ventricular  hypertrophy. Left ventricular diastolic parameters are consistent with Grade I diastolic  dysfunction (impaired relaxation).   2. Right ventricular systolic function is normal. The right ventricular  size is normal. There is normal pulmonary artery systolic pressure. The estimated right ventricular systolic pressure is [id number] mmHg.   3. Left atrial size was moderately dilated.   4. The mitral valve is grossly normal. Trivial mitral valve  regurgitation.   5. The aortic valve is tricuspid. Aortic valve regurgitation is not  visualized.   6. The inferior vena cava is normal in size with greater than 50%  respiratory variability, suggesting right atrial pressure of 3 mmHg.    Assessment and Plan: 1. Persistent afib Patient is s/p dofetilide admission 5/24-5/27/22 S/p afib ablation 11/16/21 with Dr Quentin Ore. Reviewed the above ECG with Dr Quentin Ore. Aberrantly conducting PACs felt to be 2/2 ablation recovery.  Will decrease dofetilide to 250 mcg BID given long QT today. Repeat ECG next week.  Stop diltiazem and start Toprol 25 mg BID Continue Xarelto 20 mg daily with no missed doses for at least 3 months post ablation.   2. CHA2DS2VASc score of at least 4 Continue Xarelto 20 mg daily   3. OSA He has been referred for [facility] device.   Follow up in the AF clinic next week.    [facility] [address] [place], [place] [address] [phone]

## 2021-11-23 NOTE — Patient Instructions (Signed)
DECREASE Tikosyn (dofetilide) to twice a day  STOP cardizem (diltiazem)  START metoprolol 25mg  twice a day

## 2021-11-23 NOTE — Telephone Encounter (Signed)
Pt c/o BP issue: STAT if pt c/o blurred vision, one-sided weakness or slurred speech  1. What are your last 5 BP readings?  161/55 152/95 145/105 156/107 159/101 All with irregular heart beat   2. Are you having any other symptoms (ex. Dizziness, headache, blurred vision, passed out)? Swimmy head, that's all.   3. What is your BP issue? BP is runny high.

## 2021-11-30 ENCOUNTER — Encounter (HOSPITAL_COMMUNITY): Payer: Self-pay | Admitting: Physician Assistant

## 2021-11-30 ENCOUNTER — Ambulatory Visit (HOSPITAL_COMMUNITY)
Admission: RE | Admit: 2021-11-30 | Discharge: 2021-11-30 | Disposition: A | Discharge status: Home / Self Care | Payer: Medicare Other | Source: Ambulatory Visit | Attending: Physician Assistant | Admitting: Physician Assistant

## 2021-11-30 VITALS — BP 116/88 | HR 53 | Ht 67.0 in | Wt 187.0 lb

## 2021-11-30 DIAGNOSIS — Z79899 Other long term (current) drug therapy: Secondary | ICD-10-CM | POA: Insufficient documentation

## 2021-11-30 DIAGNOSIS — Z7901 Long term (current) use of anticoagulants: Secondary | ICD-10-CM | POA: Diagnosis not present

## 2021-11-30 DIAGNOSIS — D6869 Other thrombophilia: Secondary | ICD-10-CM | POA: Diagnosis not present

## 2021-11-30 DIAGNOSIS — G4733 Obstructive sleep apnea (adult) (pediatric): Secondary | ICD-10-CM | POA: Insufficient documentation

## 2021-11-30 DIAGNOSIS — I4819 Other persistent atrial fibrillation: Secondary | ICD-10-CM | POA: Diagnosis present

## 2021-11-30 LAB — BASIC METABOLIC PANEL
Anion gap: 5 (ref 5–15)
BUN: 15 mg/dL (ref 8–23)
CO2: 28 mmol/L (ref 22–32)
Calcium: 9.3 mg/dL (ref 8.9–10.3)
Chloride: 106 mmol/L (ref 98–111)
Creatinine, Ser: 1.2 mg/dL (ref 0.61–1.24)
GFR, Estimated: >60 mL/min (ref >60)
Glucose, Bld: 99 mg/dL (ref 70–99)
Potassium: 4.4 mmol/L (ref 3.5–5.1)
Sodium: 139 mmol/L (ref 135–145)

## 2021-11-30 NOTE — Progress Notes (Signed)
Primary Care Physician: Marcell Anger, NP Referring Physician: Dr. Brooke Pace is a 65 y.o. male with a h/o persistent  afib that failed flecainide and was referred to Dr. Lalla Brothers 01/2021,  to be considered for ablation. The imaging needed for ablation revealed a large Hiatal hernia. Ablation plans were  discontinued  due to safety concerns of burning  left atrium with HH comprising  Left atrium. Flecainide was discontinued. The original plan was to have tikosyn admit then [facility] repair but the timing did not work out that way. He is now around 2 weeks s/p hernia repair. He is here to discuss Tikosyn admit. He restarted anticoagulation 4/29, after  missing one day pre op.   No benadryl use. Qtc acceptable. Discussed price of drug. He plans to use good rx. He is rate controlled but  is bothered with fatigue and she shortness of  breath with exertion. He was seen by EP during his recent surgery and diltiazem was increased to 360 mg daily.  I did discuss with Dr. Lalla Brothers  going ahead with tikosyn admit vrs ablation and he wants him to go ahead with tikosyn admit for now but he will ablate down the road when he has fully  recovered from his surgery.  Follow up 05/19/21 in the AF clinic. Patient presents for dofetilide admission. He continues to have SOB with exertion. He denies any bleeding issues on anticoagulation and has not missed any doses in the last 3 weeks.    Follow up in the AF clinic 05/28/21. Patient is s/p dofetilide loading 5/24-5/27/22. He converted with the medication and did not require DCCV. He did have an episode of afib on 05/25/21 which lasted about 6 hours. He denies any bleeding issues on anticoagulation.   Follow up in the AF clinic 11/23/21. Patient underwent afib ablation 11/16/21 with Dr Lalla Brothers. Since 11/18/21, patient has noted any irregular heart beat associated with dizziness. His BP machine at home showed an irregular rhythm. ECG today shows aberrantly conducting  PACs in bigeminal pattern.   Follow up in the AF clinic 11/30/21. Patient reports that he is feeling much better with the medication changes. His dizziness has resolved. He is in SR today.   Today, he denies symptoms of palpitations, chest pain, orthopnea, PND, lower extremity edema, presyncope, syncope, or neurologic sequela. The patient is tolerating medications without difficulties and is otherwise without complaint today.   Past Medical History:  Diagnosis Date   A-fib (HCC)    Complication of anesthesia    "waking up, throat closed up"   CVA (cerebral vascular accident) (HCC) 2000   GERD (gastroesophageal reflux disease)    History of kidney stones    HLD (hyperlipidemia)    HTN (hypertension)    Obesity    OSA (obstructive sleep apnea)    OSA (obstructive sleep apnea) 05/07/2015   Right carotid artery occlusion    Chronic RICA occlusion, no stenosis LICA on 01/26/21 carotid Duplex   Past Surgical History:  Procedure Laterality Date   ATRIAL FIBRILLATION ABLATION N/A 11/16/2021   Procedure: ATRIAL FIBRILLATION ABLATION;  Surgeon: Lanier Prude, MD;  Location: [facility] INVASIVE CV LAB;  Service: Cardiovascular;  Laterality: N/A;   CARDIOVERSION N/A 06/03/2015   Procedure: CARDIOVERSION;  Surgeon: Wendall Stade, MD;  Location: [facility] ENDOSCOPY;  Service: Cardiovascular;  Laterality: N/A;   CARDIOVERSION N/A 05/11/2017   Procedure: CARDIOVERSION;  Surgeon: Wendall Stade, MD;  Location: [facility] ENDOSCOPY;  Service: Cardiovascular;  Laterality: N/A;   CARDIOVERSION N/A 04/11/2020   Procedure: CARDIOVERSION;  Surgeon: Pricilla Riffle, MD;  Location: [facility] ENDOSCOPY;  Service: Cardiovascular;  Laterality: N/A;   COLONOSCOPY     ESOPHAGOGASTRODUODENOSCOPY N/A 04/23/2021   Procedure: ESOPHAGOGASTRODUODENOSCOPY (EGD);  Surgeon: Corliss Skains, MD;  Location: [facility] OR;  Service: Thoracic;  Laterality: N/A;   TONSILLECTOMY     XI ROBOTIC ASSISTED PARAESOPHAGEAL HERNIA REPAIR N/A 04/23/2021   Procedure:  XI ROBOTIC ASSISTED PARAESOPHAGEAL HERNIA REPAIR WITH FUNDOPLICATION USING ACELL GENTRIX SURGICAL MATRIX;  Surgeon: Corliss Skains, MD;  Location: [facility] OR;  Service: Thoracic;  Laterality: N/A;    Current Outpatient Medications  Medication Sig Dispense Refill   acetaminophen (TYLENOL) 500 MG tablet Take 2 tablets (1,000 mg total) by mouth every 6 (six) hours. 30 tablet 0   dofetilide (TIKOSYN) 250 MCG capsule Take 1 capsule (250 mcg total) by mouth 2 (two) times daily. 60 capsule 3   enalapril (VASOTEC) 20 MG tablet Take 20 mg by mouth daily.     metoprolol succinate (TOPROL XL) 25 MG 24 hr tablet Take 1 tablet (25 mg total) by mouth 2 (two) times daily. 60 tablet 3   pantoprazole (PROTONIX) 40 MG tablet Take 1 tablet (40 mg total) by mouth daily. 45 tablet 0   potassium chloride (KLOR-CON) 10 MEQ tablet TAKE 1 TABLET BY MOUTH EVERY DAY 90 tablet 3   rivaroxaban (XARELTO) 20 MG TABS tablet Take 20 mg by mouth daily.     Vitamin D, Ergocalciferol, (DRISDOL) 1.25 MG (50000 UNIT) CAPS capsule Take 50,000 Units by mouth every 30 (thirty) days.     No current facility-administered medications for this encounter.    Allergies  Allergen Reactions   Ciprofloxacin Swelling and Other (See Comments)    Pt states he took med for 3 days and ankle swelling started.      Social History   Socioeconomic History   Marital status: Married    Spouse name: Not on file   Number of children: Not on file   Years of education: Not on file   Highest education level: Not on file  Occupational History   Not on file  Tobacco Use   Smoking status: Never   Smokeless tobacco: Never  Vaping Use   Vaping Use: Never used  Substance and Sexual Activity   Alcohol use: No    Alcohol/week: 0.0 standard drinks   Drug use: No   Sexual activity: Not on file  Other Topics Concern   Not on file  Social History Narrative   Not on file   Social Determinants of Health   Financial Resource Strain: Not on  file  Food Insecurity: Not on file  Transportation Needs: Not on file  Physical Activity: Not on file  Stress: Not on file  Social Connections: Not on file  Intimate Partner Violence: Not on file    Family History  Problem Relation Age of Onset   Atrial fibrillation Mother    Cancer Father        BONE   Heart disease Father    Prostate cancer Other     ROS- All systems are reviewed and negative except as per the HPI above  Physical Exam: Vitals:   11/30/21 1449  BP: 116/88  Pulse: (!) 53  Weight: 84.8 kg  Height: 5\' 7"  (1.702 m)    Wt Readings from Last 3 Encounters:  11/30/21 84.8 kg  11/23/21 84.9 kg  11/16/21 88.9 kg    Labs:  Lab Results  Component Value Date   NA 136 11/23/2021   K 3.7 11/23/2021   CL 104 11/23/2021   CO2 24 11/23/2021   GLUCOSE 232 (H) 11/23/2021   BUN 17 11/23/2021   CREATININE 1.11 11/23/2021   CALCIUM 9.4 11/23/2021   MG 2.1 11/23/2021   Lab Results  Component Value Date   INR 1.2 04/21/2021   No results found for: CHOL, HDL, LDLCALC, TRIG  GEN- The patient is a well appearing male, alert and oriented x 3 today.   HEENT-head normocephalic, atraumatic, sclera clear, conjunctiva pink, hearing intact, trachea midline. Lungs- Clear to ausculation bilaterally, normal work of breathing Heart- Regular rate and rhythm, no murmurs, rubs or gallops  GI- soft, NT, ND, + BS Extremities- no clubbing, cyanosis, or edema MS- no significant deformity or atrophy Skin- no rash or lesion Psych- euthymic mood, full affect Neuro- strength and sensation are intact   EKG today demonstrated  SR Vent. rate 53 BPM PR interval 150 ms QRS duration 112 ms QT/QTcB 456/427 ms   Echo 03/06/21 demonstrated   1. Left ventricular ejection fraction, by estimation, is 60 to 65%. The  left ventricle has normal function. The left ventricle has no regional  wall motion abnormalities. There is mild concentric left ventricular  hypertrophy. Left ventricular  diastolic parameters are consistent with Grade I diastolic dysfunction (impaired relaxation).   2. Right ventricular systolic function is normal. The right ventricular  size is normal. There is normal pulmonary artery systolic pressure. The estimated right ventricular systolic pressure is [id number] mmHg.   3. Left atrial size was moderately dilated.   4. The mitral valve is grossly normal. Trivial mitral valve  regurgitation.   5. The aortic valve is tricuspid. Aortic valve regurgitation is not  visualized.   6. The inferior vena cava is normal in size with greater than 50%  respiratory variability, suggesting right atrial pressure of 3 mmHg.    Assessment and Plan: 1. Persistent afib Patient is s/p dofetilide admission 5/24-5/27/22 S/p afib ablation 11/16/21 with Dr Quentin Ore. Patient in [place] today.  Continue dofetilide 250 mcg BID. QT stable. Recheck bmet Continue Toprol 25 mg BID Continue Xarelto 20 mg daily with no missed doses for at least 3 months post ablation.   2. CHA2DS2VASc score of at least 4 Continue Xarelto 20 mg daily   3. OSA He has been referred for [facility] device.   Follow up with Dr Quentin Ore as scheduled.    [facility] [address] [place], [place] [address] [phone]

## 2021-12-02 ENCOUNTER — Telehealth: Payer: Self-pay | Admitting: Cardiology

## 2021-12-02 MED ORDER — POTASSIUM CHLORIDE ER 10 MEQ PO TBCR: 20.0000 meq | EXTENDED_RELEASE_TABLET | Freq: Every day | ORAL | 3 refills | Status: DC

## 2021-12-02 MED ORDER — RIVAROXABAN 20 MG PO TABS: 20.0000 mg | ORAL_TABLET | Freq: Every day | ORAL | 6 refills | Status: DC

## 2021-12-02 NOTE — Telephone Encounter (Signed)
Spoke with patient he just needed a refill of his Xarelto and Potassium. RX sent in.

## 2021-12-02 NOTE — Telephone Encounter (Signed)
Wife of patient called. She is looking at the patient's medication list and there are medications that he does not have. She wanted to get clarification from Dr. Geannie Risen Nurse in regards to what medicine he needs to take.   Please call

## 2021-12-11 ENCOUNTER — Ambulatory Visit (HOSPITAL_COMMUNITY): Payer: Medicare Other | Admitting: Physician Assistant

## 2022-01-26 ENCOUNTER — Other Ambulatory Visit: Payer: Self-pay

## 2022-01-26 ENCOUNTER — Ambulatory Visit (HOSPITAL_COMMUNITY)
Admission: RE | Admit: 2022-01-26 | Discharge: 2022-01-26 | Disposition: A | Discharge status: Home / Self Care | Payer: Medicare PPO | Source: Ambulatory Visit | Attending: Cardiovascular Disease | Admitting: Cardiovascular Disease

## 2022-01-26 DIAGNOSIS — I6523 Occlusion and stenosis of bilateral carotid arteries: Secondary | ICD-10-CM | POA: Insufficient documentation

## 2022-02-18 ENCOUNTER — Other Ambulatory Visit (HOSPITAL_COMMUNITY): Payer: Self-pay | Admitting: *Deleted

## 2022-02-18 MED ORDER — METOPROLOL SUCCINATE ER 25 MG PO TB24: 25.0000 mg | ORAL_TABLET | Freq: Two times a day (BID) | ORAL | 6 refills | Status: DC

## 2022-02-22 ENCOUNTER — Other Ambulatory Visit: Payer: Self-pay

## 2022-02-22 ENCOUNTER — Encounter: Payer: Self-pay | Admitting: Cardiology

## 2022-02-22 ENCOUNTER — Ambulatory Visit: Payer: Medicare PPO | Admitting: Cardiology

## 2022-02-22 VITALS — BP 130/84 | HR 46 | Ht 67.0 in | Wt 184.8 lb

## 2022-02-22 DIAGNOSIS — I4819 Other persistent atrial fibrillation: Secondary | ICD-10-CM

## 2022-02-22 DIAGNOSIS — Z79899 Other long term (current) drug therapy: Secondary | ICD-10-CM | POA: Diagnosis not present

## 2022-02-22 NOTE — Patient Instructions (Addendum)
Medication Instructions:  ?Your physician recommends that you continue on your current medications as directed. Please refer to the Current Medication list given to you today. ?*If you need a refill on your cardiac medications before your next appointment, please call your pharmacy* ? ?Lab Work: ?None. ?If you have labs (blood work) drawn today and your tests are completely normal, you will receive your results only by: ?MyChart Message (if you have MyChart) OR ?A paper copy in the mail ?If you have any lab test that is abnormal or we need to change your treatment, we will call you to review the results. ? ?Testing/Procedures: ?None. ? ?Follow-Up: ?At [facility], you and your health needs are our priority.  As part of our continuing mission to provide you with exceptional heart care, we have created designated Provider Care Teams.  These Care Teams include your primary Cardiologist (physician) and Advanced Practice Providers (APPs -  Physician Assistants and Nurse Practitioners) who all work together to provide you with the care you need, when you need it. ? ?Your physician wants you to follow-up in: 3 months with  one of the following Advanced Practice Providers on your designated Care Team:   ? ?Renee Ursuy, PA-C ?Michael "Andy" Tillery, PA-C ?  You will receive a reminder letter in the mail two months in advance. If you don't receive a letter, please call our office to schedule the follow-up appointment. ? ?We recommend signing up for the patient portal called "MyChart".  Sign up information is provided on this After Visit Summary.  MyChart is used to connect with patients for Virtual Visits (Telemedicine).  Patients are able to view lab/test results, encounter notes, upcoming appointments, etc.  Non-urgent messages can be sent to your provider as well.   ?To learn more about what you can do with MyChart, go to [email or web address].   ? ?Any Other Special Instructions Will Be Listed Below (If  Applicable). ? ? ? ? ?  ? ? ?

## 2022-02-22 NOTE — Progress Notes (Signed)
Electrophysiology Office Follow up Visit Note:    Date:  02/22/2022   ID:  Tanner Rich, DOB March 16, 1956, MRN [id number]  PCP:  Gennette Pac, NP  [facility] Cardiologist:  Jenkins Rouge, MD  [facility] Electrophysiologist:  Vickie Epley, MD    Interval History:    Tanner Rich is a 66 y.o. male who presents for a follow up visit. They were last seen in clinic 10/09/2021.  Since their last appointment, they underwent successful PVI ablation on 11/16/2021. He last followed up with Adline Peals, PA 11/30/2021 where he was feeling much better on his regimen. He was in sinus rhythm and reported his dizziness had resolved.  Overall, he is feeling pretty good. He denies any recurrent issues with arrhythmias. On some days he will feel "worn out" and needs to rest more than usual. However, he does not believe he is in atrial fibrillation at those times.  He remains compliant with Tikosyn twice daily.  He denies any chest pain, shortness of breath, or peripheral edema. No lightheadedness, headaches, syncope, orthopnea, or PND.      Past Medical History:  Diagnosis Date   A-fib ([place])    Complication of anesthesia    "waking up, throat closed up"   CVA (cerebral vascular accident) ([place]) 2000   GERD (gastroesophageal reflux disease)    History of kidney stones    HLD (hyperlipidemia)    HTN (hypertension)    Obesity    OSA (obstructive sleep apnea)    OSA (obstructive sleep apnea) 05/07/2015   Right carotid artery occlusion    Chronic RICA occlusion, no stenosis LICA on [id number] carotid Duplex    Past Surgical History:  Procedure Laterality Date   ATRIAL FIBRILLATION ABLATION N/A 11/16/2021   Procedure: ATRIAL FIBRILLATION ABLATION;  Surgeon: Vickie Epley, MD;  Location: [place] [facility];  Service: Cardiovascular;  Laterality: N/A;   CARDIOVERSION N/A 06/03/2015   Procedure: CARDIOVERSION;  Surgeon: Josue Hector, MD;  Location: [place];  Service:  Cardiovascular;  Laterality: N/A;   CARDIOVERSION N/A 05/11/2017   Procedure: CARDIOVERSION;  Surgeon: Josue Hector, MD;  Location: [facility] ENDOSCOPY;  Service: Cardiovascular;  Laterality: N/A;   CARDIOVERSION N/A 04/11/2020   Procedure: CARDIOVERSION;  Surgeon: Fay Records, MD;  Location: [facility] ENDOSCOPY;  Service: Cardiovascular;  Laterality: N/A;   COLONOSCOPY     ESOPHAGOGASTRODUODENOSCOPY N/A 04/23/2021   Procedure: ESOPHAGOGASTRODUODENOSCOPY (EGD);  Surgeon: Lajuana Matte, MD;  Location: [facility] OR;  Service: Thoracic;  Laterality: N/A;   TONSILLECTOMY     XI ROBOTIC ASSISTED PARAESOPHAGEAL HERNIA REPAIR N/A 04/23/2021   Procedure: XI ROBOTIC ASSISTED PARAESOPHAGEAL HERNIA REPAIR WITH FUNDOPLICATION USING ACELL GENTRIX SURGICAL MATRIX;  Surgeon: Lajuana Matte, MD;  [facility];  Service: Thoracic;  Laterality: N/A;    Current Medications: Current Meds  Medication Sig   acetaminophen (TYLENOL) 500 MG tablet Take 2 tablets (1,000 mg total) by mouth every 6 (six) hours.   dofetilide (TIKOSYN) 250 MCG capsule Take 1 capsule (250 mcg total) by mouth 2 (two) times daily.   enalapril (VASOTEC) 20 MG tablet Take 20 mg by mouth daily.   metoprolol succinate (TOPROL XL) 25 MG 24 hr tablet Take 1 tablet (25 mg total) by mouth 2 (two) times daily.   potassium chloride (KLOR-CON) 10 MEQ tablet Take 2 tablets (20 mEq total) by mouth daily.   rivaroxaban (XARELTO) 20 MG TABS tablet Take 1 tablet (20 mg total) by mouth daily.  Vitamin D, Ergocalciferol, (DRISDOL) 1.25 MG (50000 UNIT) CAPS capsule Take 50,000 Units by mouth every 30 (thirty) days.     Allergies:   Ciprofloxacin   Social History   Socioeconomic History   Marital status: Married    Spouse name: Not on file   Number of children: Not on file   Years of education: Not on file   Highest education level: Not on file  Occupational History   Not on file  Tobacco Use   Smoking status: Never   Smokeless tobacco: Never   Vaping Use   Vaping Use: Never used  Substance and Sexual Activity   Alcohol use: No    Alcohol/week: 0.0 standard drinks   Drug use: No   Sexual activity: Not on file  Other Topics Concern   Not on file  Social History Narrative   Not on file   Social Determinants of Health   Financial Resource Strain: Not on file  Food Insecurity: Not on file  Transportation Needs: Not on file  Physical Activity: Not on file  Stress: Not on file  Social Connections: Not on file     Family History: The patient's family history includes Atrial fibrillation in his mother; Cancer in his father; Heart disease in his father; Prostate cancer in an other family member.  ROS:   Please see the history of present illness.    (+) Fatigue All other systems reviewed and are negative.  EKGs/Labs/Other Studies Reviewed:    The following studies were reviewed today:  Bilateral Carotid Doppler 01/26/2022: Summary:  Right Carotid: Evidence consistent with a total occlusion of the right  ICA. Chronic.   Left Carotid: There was no evidence of thrombus, dissection,  Atherosclerotic plaque or stenosis in the cervical carotid system.   Vertebrals:  Bilateral vertebral arteries demonstrate antegrade flow.  Subclavians: Normal flow hemodynamics were seen in bilateral subclavian arteries.   Atrial Fibrillation Ablation 11/16/2021: CONCLUSIONS: 1. Successful PVI 2. Successful ablation/isolation of the posterior wall 3. Intracardiac echo reveals widely spaced right pulmonary veins with complex branching, trivial pericardial effusion, normal left ventricular function 4. No early apparent complications. 5.  Colchicine 0.6 mg by mouth twice daily for 5 days 6.  Protonix 40 mg by mouth once daily for 45 days  Cardiac CTA 11/09/2021: There is normal variant pulmonary vein drainage into the left atrium (3 on the right and 2 on the left) with ostial measurements as follows:   RUPV: 21 mm X 19 mm; Area 2.7  cm2   RMPV: 12 mm X 12 mm; Area 1 cm2   RLPV: 25 mm X 17 mm, Area 3.3 cm2   LUPV: 17 mm X 16 mm, Area 2 cm2   LLPV: 20 mm X 20 mm Area 3 cm2   The left atrial appendage is large - broccoli appearance with ostial size 25 x 22 mm and length 40 mm. There is no thrombus in the left atrial appendage.   The esophagus runs in the left atrial midline but is in close proximity to the left upper pulmonary vein. Left upper and lower pulmonary veins fuse to a brief common ostium.   Aorta:  Normal caliber.  No dissection or calcifications.   Main Pulmonary Artery: Normal caliber.   Coronary sinus: Mild dilation.   Aortic Valve:  Tri-leaflet.  No calcifications.   Coronary Arteries: Normal coronary origin. The study was performed without use of NTG and insufficient for plaque evaluation.   Coronary Calcium Score:   Left main:  0   Left anterior descending artery: 80   Left circumflex artery: 8   Right coronary artery: 0   Total: 88   Percentile: 48th for age, sex, and race matched control.   Extra-cardiac findings: See attached radiology report for non-cardiac structures.   IMPRESSION: 1. There is normal variant pulmonary vein drainage into the left atrium.   2. The left atrial appendage is large - broccoli appearance with ostial size 25 x 22 mm and length 40 mm. There is no thrombus in the left atrial appendage.   3. The esophagus runs in the left atrial midline but is in close proximity to the left upper pulmonary vein. Left upper and lower pulmonary veins fuse to a brief common ostium.   4. Coronary calcium score of 88. This was 48th percentile for age, sex, and race matched control.   Echo 03/06/2021:  1. Left ventricular ejection fraction, by estimation, is 60 to 65%. The  left ventricle has normal function. The left ventricle has no regional  wall motion abnormalities. There is mild concentric left ventricular  hypertrophy. Left ventricular diastolic   parameters are consistent with Grade I diastolic dysfunction (impaired  relaxation).   2. Right ventricular systolic function is normal. The right ventricular  size is normal. There is normal pulmonary artery systolic pressure. The  estimated right ventricular systolic pressure is [id number] mmHg.   3. Left atrial size was moderately dilated.   4. The mitral valve is grossly normal. Trivial mitral valve  regurgitation.   5. The aortic valve is tricuspid. Aortic valve regurgitation is not  visualized.   6. The inferior vena cava is normal in size with greater than 50%  respiratory variability, suggesting right atrial pressure of 3 mmHg.   Comparison(s): No prior Echocardiogram. Unable to access report or images  from 03/10/2006 or 1998 study.   EKG:  EKG is personally reviewed.  02/22/2022: Sinus bradycardia.  QTc is 440 ms.    Recent Labs: 04/21/2021: ALT 10 10/23/2021: Hemoglobin 13.5; Platelets 227 11/23/2021: Magnesium 2.1 11/30/2021: BUN 15; Creatinine, Ser 1.20; Potassium 4.4; Sodium 139   Recent Lipid Panel No results found for: CHOL, TRIG, HDL, CHOLHDL, VLDL, LDLCALC, LDLDIRECT  Physical Exam:    VS:  BP 130/84    Pulse (!) 46    Ht 5\' 7"  (1.702 m)    Wt 184 lb 12.8 oz (83.8 kg)    SpO2 97%    BMI 28.94 kg/m     Wt Readings from Last 3 Encounters:  02/22/22 184 lb 12.8 oz (83.8 kg)  11/30/21 187 lb (84.8 kg)  11/23/21 187 lb 3.2 oz (84.9 kg)     GEN: Well nourished, well developed in no acute distress HEENT: Normal NECK: No JVD; No carotid bruits LYMPHATICS: No lymphadenopathy CARDIAC: RRR, no murmurs, rubs, gallops RESPIRATORY:  Clear to auscultation without rales, wheezing or rhonchi  ABDOMEN: Soft, non-tender, non-distended MUSCULOSKELETAL:  No edema; No deformity  SKIN: Warm and dry NEUROLOGIC:  Alert and oriented x 3 PSYCHIATRIC:  Normal affect        ASSESSMENT:    1. Persistent atrial fibrillation ([place])   2. Encounter for long-term (current) use of  high-risk medication    PLAN:    In order of problems listed above:  #Persistent atrial fibrillation Doing well after his ablation.  Early on, had symptomatic A. tach following the ablation but this is now cooled off and he is doing well.  Continue dofetilide at current dosage.  Continue metoprolol.  Continue Xarelto for stroke prophylaxis.  Follow-up 3 months with APP for dofetilide monitoring.    Medication Adjustments/Labs and Tests Ordered: Current medicines are reviewed at length with the patient today.  Concerns regarding medicines are outlined above.  No orders of the defined types were placed in this encounter.  No orders of the defined types were placed in this encounter.   I,Mathew Stumpf,acting as a Education administrator for Vickie Epley, MD.,have documented all relevant documentation on the behalf of Vickie Epley, MD,as directed by  Vickie Epley, MD while in the presence of Vickie Epley, MD.  I, Vickie Epley, MD, have reviewed all documentation for this visit. The documentation on 02/22/22 for the exam, diagnosis, procedures, and orders are all accurate and complete.   Signed, Lars Mage, MD, [facility], [facility] 02/22/2022 9:22 PM    Electrophysiology [facility]

## 2022-04-08 ENCOUNTER — Other Ambulatory Visit (HOSPITAL_COMMUNITY): Payer: Self-pay

## 2022-04-12 ENCOUNTER — Other Ambulatory Visit (HOSPITAL_COMMUNITY): Payer: Self-pay | Admitting: *Deleted

## 2022-04-12 MED ORDER — DOFETILIDE 250 MCG PO CAPS: 250.0000 ug | ORAL_CAPSULE | Freq: Two times a day (BID) | ORAL | 6 refills | Status: DC

## 2022-05-14 NOTE — Progress Notes (Signed)
PCP:  Marcell Anger, NP Primary Cardiologist: Charlton Haws, MD Electrophysiologist: Lanier Prude, MD   Tanner Rich is a 66 y.o. male seen today for Lanier Prude, MD for routine electrophysiology followup.  Since last being seen in our clinic the patient reports doing very well. Has rare palpitations in the am, that range from a few seconds to 20-30 minutes. Not limiting. he denies chest pain, dyspnea, PND, orthopnea, nausea, vomiting, dizziness, syncope, edema, weight gain, or early satiety.  Past Medical History:  Diagnosis Date   A-fib ([facility])    Complication of anesthesia    "waking up, throat closed up"   CVA (cerebral vascular accident) ([facility]) 2000   GERD (gastroesophageal reflux disease)    History of kidney stones    HLD (hyperlipidemia)    HTN (hypertension)    Obesity    OSA (obstructive sleep apnea)    OSA (obstructive sleep apnea) 05/07/2015   Right carotid artery occlusion    Chronic RICA occlusion, no stenosis LICA on 01/26/21 carotid Duplex   Past Surgical History:  Procedure Laterality Date   ATRIAL FIBRILLATION ABLATION N/A 11/16/2021   Procedure: ATRIAL FIBRILLATION ABLATION;  Surgeon: Lanier Prude, MD;  Location: [facility];  Service: Cardiovascular;  Laterality: N/A;   CARDIOVERSION N/A 06/03/2015   Procedure: CARDIOVERSION;  Surgeon: Wendall Stade, MD;  Location: [facility] ENDOSCOPY;  Service: Cardiovascular;  Laterality: N/A;   CARDIOVERSION N/A 05/11/2017   Procedure: CARDIOVERSION;  Surgeon: Wendall Stade, MD;  Location: [facility] ENDOSCOPY;  Service: Cardiovascular;  Laterality: N/A;   CARDIOVERSION N/A 04/11/2020   Procedure: CARDIOVERSION;  Surgeon: Pricilla Riffle, MD;  Location: [facility] ENDOSCOPY;  Service: Cardiovascular;  Laterality: N/A;   COLONOSCOPY     ESOPHAGOGASTRODUODENOSCOPY N/A 04/23/2021   Procedure: ESOPHAGOGASTRODUODENOSCOPY (EGD);  Surgeon: Corliss Skains, MD;  Location: [facility] OR;  Service: Thoracic;  Laterality: N/A;    TONSILLECTOMY     XI ROBOTIC ASSISTED PARAESOPHAGEAL HERNIA REPAIR N/A 04/23/2021   Procedure: XI ROBOTIC ASSISTED PARAESOPHAGEAL HERNIA REPAIR WITH FUNDOPLICATION USING ACELL GENTRIX SURGICAL MATRIX;  Surgeon: Corliss Skains, MD;  Location: [facility] OR;  Service: Thoracic;  Laterality: N/A;    Current Outpatient Medications  Medication Sig Dispense Refill   acetaminophen (TYLENOL) 500 MG tablet Take 2 tablets (1,000 mg total) by mouth every 6 (six) hours. 30 tablet 0   dofetilide (TIKOSYN) 250 MCG capsule Take 1 capsule (250 mcg total) by mouth 2 (two) times daily. 60 capsule 6   enalapril (VASOTEC) 20 MG tablet Take 20 mg by mouth daily.     metoprolol succinate (TOPROL XL) 25 MG 24 hr tablet Take 1 tablet (25 mg total) by mouth 2 (two) times daily. 60 tablet 6   potassium chloride (KLOR-CON) 10 MEQ tablet Take 2 tablets (20 mEq total) by mouth daily. 180 tablet 3   rivaroxaban (XARELTO) 20 MG TABS tablet Take 1 tablet (20 mg total) by mouth daily. 30 tablet 6   tamsulosin (FLOMAX) 0.4 MG CAPS capsule Take 0.4 mg by mouth daily.     Vitamin D, Ergocalciferol, (DRISDOL) 1.25 MG (50000 UNIT) CAPS capsule Take 50,000 Units by mouth every 30 (thirty) days.     No current facility-administered medications for this visit.    Allergies  Allergen Reactions   Ciprofloxacin Swelling and Other (See Comments)    Pt states he took med for 3 days and ankle swelling started.      Social History   Socioeconomic History  Marital status: Married    Spouse name: Not on file   Number of children: Not on file   Years of education: Not on file   Highest education level: Not on file  Occupational History   Not on file  Tobacco Use   Smoking status: Never   Smokeless tobacco: Never  Vaping Use   Vaping Use: Never used  Substance and Sexual Activity   Alcohol use: No    Alcohol/week: 0.0 standard drinks   Drug use: No   Sexual activity: Not on file  Other Topics Concern   Not on file   Social History Narrative   Not on file   Social Determinants of Health   Financial Resource Strain: Not on file  Food Insecurity: Not on file  Transportation Needs: Not on file  Physical Activity: Not on file  Stress: Not on file  Social Connections: Not on file  Intimate Partner Violence: Not on file     Review of Systems: All other systems reviewed and are otherwise negative except as noted above.  Physical Exam: Vitals:   05/20/22 0805  BP: (!) 144/100  Pulse: 61  SpO2: 97%  Weight: 174 lb (78.9 kg)  Height: 5\' 8"  (1.727 m)    GEN- The patient is well appearing, alert and oriented x 3 today.   HEENT: normocephalic, atraumatic; sclera clear, conjunctiva pink; hearing intact; oropharynx clear; neck supple, no JVP Lymph- no cervical lymphadenopathy Lungs- Clear to ausculation bilaterally, normal work of breathing.  No wheezes, rales, rhonchi Heart- Regular rate and rhythm, no murmurs, rubs or gallops, PMI not laterally displaced GI- soft, non-tender, non-distended, bowel sounds present, no hepatosplenomegaly Extremities- no clubbing, cyanosis, or edema; DP/PT/radial pulses 2+ bilaterally MS- no significant deformity or atrophy Skin- warm and dry, no rash or lesion Psych- euthymic mood, full affect Neuro- strength and sensation are intact  EKG is ordered. Personal review of EKG from today shows NSR at 61 bpm with QT/QTc 428/430  Additional studies reviewed include: Previous EP office notes.   Assessment and Plan:  1. Persistent atrial fibrillation Doing well after his ablation, early on had symptomatic A tach following the ablation that cooled down.  Continue tikosyn at current dose EKG today shows NSR with stable QTc Continue xarelto CHA2DS2/VASc of at least 2.    2. HTN Somewhat elevated today and he is not sure if he is taking Enalapril. He will check once home and we will consider resuming at current or lower dose.      Follow up with Dr. in 3  months   Lalla Brothers, PA-C  05/20/22 8:08 AM

## 2022-05-20 ENCOUNTER — Encounter (INDEPENDENT_AMBULATORY_CARE_PROVIDER_SITE_OTHER): Payer: Self-pay

## 2022-05-20 ENCOUNTER — Ambulatory Visit: Payer: Medicare PPO | Admitting: Student

## 2022-05-20 ENCOUNTER — Encounter: Payer: Self-pay | Admitting: Student

## 2022-05-20 VITALS — BP 144/100 | HR 61 | Ht 68.0 in | Wt 174.0 lb

## 2022-05-20 DIAGNOSIS — I4819 Other persistent atrial fibrillation: Secondary | ICD-10-CM

## 2022-05-20 DIAGNOSIS — Z79899 Other long term (current) drug therapy: Secondary | ICD-10-CM

## 2022-05-20 LAB — MAGNESIUM: Magnesium: 2.2 mg/dL (ref 1.6–2.3)

## 2022-05-20 LAB — BASIC METABOLIC PANEL
BUN/Creatinine Ratio: 15 (ref 10–24)
BUN: 15 mg/dL (ref 8–27)
CO2: 26 mmol/L (ref 20–29)
Calcium: 9.6 mg/dL (ref 8.6–10.2)
Chloride: 103 mmol/L (ref 96–106)
Creatinine, Ser: 1.02 mg/dL (ref 0.76–1.27)
Glucose: 89 mg/dL (ref 70–99)
Potassium: 4.7 mmol/L (ref 3.5–5.2)
Sodium: 143 mmol/L (ref 134–144)
eGFR: 80 mL/min/{1.73_m2} (ref >59)

## 2022-05-20 NOTE — Patient Instructions (Signed)
Medication Instructions:  Your physician recommends that you continue on your current medications as directed. Please refer to the Current Medication list given to you today.  *If you need a refill on your cardiac medications before your next appointment, please call your pharmacy*   Lab Work: TODAY: BMET, Mag  If you have labs (blood work) drawn today and your tests are completely normal, you will receive your results only by: [place] (if you have MyChart) OR A paper copy in the mail If you have any lab test that is abnormal or we need to change your treatment, we will call you to review the results.   Follow-Up: At [facility], you and your health needs are our priority.  As part of our continuing mission to provide you with exceptional heart care, we have created designated Provider Care Teams.  These Care Teams include your primary Cardiologist (physician) and Advanced Practice Providers (APPs -  Physician Assistants and Nurse Practitioners) who all work together to provide you with the care you need, when you need it.  We recommend signing up for the patient portal called "MyChart".  Sign up information is provided on this After Visit Summary.  MyChart is used to connect with patients for Virtual Visits (Telemedicine).  Patients are able to view lab/test results, encounter notes, upcoming appointments, etc.  Non-urgent messages can be sent to your provider as well.   To learn more about what you can do with MyChart, go to [email or web address].    Your next appointment:   3 month(s)  The format for your next appointment:   In Person  Provider:   Lars Mage, MD

## 2022-05-28 ENCOUNTER — Other Ambulatory Visit: Payer: Self-pay | Admitting: Thoracic Surgery (Cardiothoracic Vascular Surgery)

## 2022-05-28 DIAGNOSIS — Z8719 Personal history of other diseases of the digestive system: Secondary | ICD-10-CM

## 2022-05-31 ENCOUNTER — Other Ambulatory Visit: Payer: Self-pay | Admitting: *Deleted

## 2022-05-31 ENCOUNTER — Ambulatory Visit
Admission: RE | Admit: 2022-05-31 | Discharge: 2022-05-31 | Disposition: A | Discharge status: Home / Self Care | Payer: Medicare PPO | Source: Ambulatory Visit | Attending: Thoracic Surgery (Cardiothoracic Vascular Surgery) | Admitting: Thoracic Surgery (Cardiothoracic Vascular Surgery)

## 2022-05-31 ENCOUNTER — Ambulatory Visit: Payer: Medicare PPO | Admitting: Thoracic Surgery (Cardiothoracic Vascular Surgery)

## 2022-05-31 VITALS — BP 122/69 | HR 54 | Resp 20 | Ht 68.0 in | Wt 166.0 lb

## 2022-05-31 DIAGNOSIS — Z8719 Personal history of other diseases of the digestive system: Secondary | ICD-10-CM

## 2022-05-31 DIAGNOSIS — K449 Diaphragmatic hernia without obstruction or gangrene: Secondary | ICD-10-CM

## 2022-05-31 DIAGNOSIS — Z9889 Other specified postprocedural states: Secondary | ICD-10-CM

## 2022-05-31 NOTE — H&P (View-Only) (Signed)
[address]       [place] [address]             [phone]                    Tannon W Wisnieski Matheny Medical Record [id number] Date of Birth: 1956/12/21  Referring: Vickie Epley, MD Primary Care: Gennette Pac, NP Primary Cardiologist: Jenkins Rouge, MD  Chief Complaint:    Chief Complaint  Patient presents with   Follow-up    CT chest today    History of Present Illness:    Tanner Rich 66 y.o. male presents to discuss recurrence of his hiatal hernia.  He originally underwent a paraesophageal hernia repair in March 2022 in preparation for an A-fib ablation procedure.  After his surgery and the ablation he has done well.  He states Sunday he experienced an acute onset chest pain and some nausea and vomiting.  He presented to the emergency department where chest x-ray revealed a recurrence of his hiatal hernia.  Since that point he is down to, and has noted some dysphagia and reflux.    Past Medical History:  Diagnosis Date   A-fib ([place])    Complication of anesthesia    "waking up, throat closed up"   CVA (cerebral vascular accident) ([place]) 2000   GERD (gastroesophageal reflux disease)    History of kidney stones    HLD (hyperlipidemia)    HTN (hypertension)    Obesity    OSA (obstructive sleep apnea)    OSA (obstructive sleep apnea) 05/07/2015   Right carotid artery occlusion    Chronic RICA occlusion, no stenosis LICA on [id number] carotid Duplex    Past Surgical History:  Procedure Laterality Date   ATRIAL FIBRILLATION ABLATION N/A 11/16/2021   Procedure: ATRIAL FIBRILLATION ABLATION;  Surgeon: Vickie Epley, MD;  Location: [facility];  Service: Cardiovascular;  Laterality: N/A;   CARDIOVERSION N/A 06/03/2015   Procedure: CARDIOVERSION;  Surgeon: Josue Hector, MD;  Location: [facility];  Service: Cardiovascular;  Laterality: N/A;   CARDIOVERSION N/A 05/11/2017   Procedure: CARDIOVERSION;  Surgeon: Josue Hector, MD;   Location: [facility];  Service: Cardiovascular;  Laterality: N/A;   CARDIOVERSION N/A 04/11/2020   Procedure: CARDIOVERSION;  Surgeon: Fay Records, MD;  Location: [facility] ENDOSCOPY;  Service: Cardiovascular;  Laterality: N/A;   COLONOSCOPY     ESOPHAGOGASTRODUODENOSCOPY N/A 04/23/2021   Procedure: ESOPHAGOGASTRODUODENOSCOPY (EGD);  Surgeon: Lajuana Matte, MD;  Location: [facility] OR;  Service: Thoracic;  Laterality: N/A;   TONSILLECTOMY     XI ROBOTIC ASSISTED PARAESOPHAGEAL HERNIA REPAIR N/A 04/23/2021   Procedure: XI ROBOTIC ASSISTED PARAESOPHAGEAL HERNIA REPAIR WITH FUNDOPLICATION USING [place];  Surgeon: Lajuana Matte, MD;  Location: [place];  Service: Thoracic;  Laterality: N/A;    Family History  Problem Relation Age of Onset   Atrial fibrillation Mother    Cancer Father        BONE   Heart disease Father    Prostate cancer Other      Social History   Tobacco Use  Smoking Status Never  Smokeless Tobacco Never    Social History   Substance and Sexual Activity  Alcohol Use No   Alcohol/week: 0.0 standard drinks     Allergies  Allergen Reactions   Ciprofloxacin Swelling and Other (See Comments)    Pt states he took med for 3 days and ankle  swelling started.      Current Outpatient Medications  Medication Sig Dispense Refill   acetaminophen (TYLENOL) 500 MG tablet Take 2 tablets (1,000 mg total) by mouth every 6 (six) hours. 30 tablet 0   dofetilide (TIKOSYN) 250 MCG capsule Take 1 capsule (250 mcg total) by mouth 2 (two) times daily. 60 capsule 6   enalapril (VASOTEC) 20 MG tablet Take 20 mg by mouth daily.     metoprolol succinate (TOPROL XL) 25 MG 24 hr tablet Take 1 tablet (25 mg total) by mouth 2 (two) times daily. 60 tablet 6   potassium chloride (KLOR-CON) 10 MEQ tablet Take 2 tablets (20 mEq total) by mouth daily. 180 tablet 3   rivaroxaban (XARELTO) 20 MG TABS tablet Take 1 tablet (20 mg total) by mouth daily. 30 tablet 6    tamsulosin (FLOMAX) 0.4 MG CAPS capsule Take 0.4 mg by mouth daily.     Vitamin D, Ergocalciferol, (DRISDOL) 1.25 MG (50000 UNIT) CAPS capsule Take 50,000 Units by mouth every 30 (thirty) days.     No current facility-administered medications for this visit.    Review of Systems  Constitutional:  Positive for weight loss.  Respiratory:  Positive for shortness of breath.   Cardiovascular:  Positive for chest pain.  Gastrointestinal:  Positive for heartburn, nausea and vomiting.   PHYSICAL EXAMINATION: BP 122/69 (BP Location: Left Arm, Patient Position: Sitting)   Pulse (!) 54   Resp 20   Ht 5\' 8"  (1.727 m)   Wt 166 lb (75.3 kg)   SpO2 96% Comment: RA  BMI 25.24 kg/m   Physical Exam Constitutional:      General: He is not in acute distress.    Appearance: He is normal weight.  HENT:     Head: Normocephalic and atraumatic.  Eyes:     Extraocular Movements: Extraocular movements intact.  Cardiovascular:     Rate and Rhythm: Tachycardia present.  Pulmonary:     Effort: Pulmonary effort is normal.  Abdominal:     General: Abdomen is flat. There is no distension.  Musculoskeletal:     Cervical back: Normal range of motion.  Neurological:     Mental Status: He is alert.     Diagnostic Studies & Laboratory data:     Recent Radiology Findings:   CT CHEST WO CONTRAST  Result Date: 05/31/2022 CLINICAL DATA:  Status post repair of paraesophageal hernia. EXAM: CT CHEST WITHOUT CONTRAST TECHNIQUE: Multidetector CT imaging of the chest was performed following the standard protocol without IV contrast. RADIATION DOSE REDUCTION: This exam was performed according to the departmental dose-optimization program which includes automated exposure control, adjustment of the mA and/or kV according to patient size and/or use of iterative reconstruction technique. COMPARISON:  Cardiac CT/CTA 03/17/2021 FINDINGS: Cardiovascular: Mild cardiac enlargement. No pericardial effusion. Aortic  atherosclerosis. Coronary artery calcifications. Mediastinum/Nodes: No enlarged mediastinal or axillary lymph nodes. Thyroid gland, trachea, and esophagus demonstrate no significant findings. Lungs/Pleura: No pleural effusion, airspace consolidation, atelectasis or pneumothorax. Subpleural right middle lobe nodule abutting the major fissure measures 5 mm. Unchanged compared with 03/17/2021. Nodule within the right base measures 3 mm and is also unchanged from the previous exam, image 117/8. There are no new or suspicious lung nodules. Upper Abdomen: No acute abnormality. Stable low-density structure in the posterior right lobe of liver measuring 8 mm. There has been interval recurrence the patient's hiatal hernia which measures approximately 10.6 x 5.2 by 8.1 cm, image 106/2. Musculoskeletal: No chest wall mass or suspicious  bone lesions identified. IMPRESSION: 1. Interval recurrence of large hiatal hernia. 2. Small right lung nodules are unchanged from 03/17/2021 and are compatible with a benign abnormality.No routine follow-up imaging is recommended per Fleischner Society Guidelines. These guidelines do not apply to immunocompromised patients and patients with cancer. Follow up in patients with significant comorbidities as clinically warranted. For lung cancer screening, adhere to Lung-RADS guidelines. Reference: Radiology. 2017; 284(1):228-43. 3. Coronary artery calcifications. 4. Aortic Atherosclerosis (ICD10-I70.0). Electronically Signed   By: Kerby Moors M.D.   On: 05/31/2022 12:45       I have independently reviewed the above radiology studies  and reviewed the findings with the patient.   Recent Lab Findings: Lab Results  Component Value Date   WBC 5.8 10/23/2021   HGB 13.5 10/23/2021   HCT 41.4 10/23/2021   PLT 227 10/23/2021   GLUCOSE 89 05/20/2022   ALT 10 04/21/2021   AST 21 04/21/2021   NA 143 05/20/2022   K 4.7 05/20/2022   CL 103 05/20/2022   CREATININE 1.02 05/20/2022   BUN 15  05/20/2022   CO2 26 05/20/2022   TSH 1.563 05/09/2017   INR 1.2 04/21/2021        Assessment / Plan:   66 year old male with a recurrent paraesophageal hernia.  This originally was repaired with ACell mesh.  He also underwent a toupet fundoplication.  He is agreeable to proceed with a redo hernia repair.  This will be performed in conjunction with an EGD.  He will hold his Xarelto for 2 days.  He is scheduled for 06/03/2022.      Lajuana Matte 05/31/2022 2:30 PM

## 2022-05-31 NOTE — Progress Notes (Signed)
[address]       [place] [address]             [phone]                    [name] Medical Record [id number] Date of Birth: 1956-12-11  Referring: Vickie Epley, MD Primary Care: Gennette Pac, NP Primary Cardiologist: Jenkins Rouge, MD  Chief Complaint:    Chief Complaint  Patient presents with   Follow-up    CT chest today    History of Present Illness:    Tanner Rich 66 y.o. male presents to discuss recurrence of his hiatal hernia.  He originally underwent a paraesophageal hernia repair in March 2022 in preparation for an A-fib ablation procedure.  After his surgery and the ablation he has done well.  He states Sunday he experienced an acute onset chest pain and some nausea and vomiting.  He presented to the emergency department where chest x-ray revealed a recurrence of his hiatal hernia.  Since that point he is down to, and has noted some dysphagia and reflux.    Past Medical History:  Diagnosis Date   A-fib ([place])    Complication of anesthesia    "waking up, throat closed up"   CVA (cerebral vascular accident) ([place]) 2000   GERD (gastroesophageal reflux disease)    History of kidney stones    HLD (hyperlipidemia)    HTN (hypertension)    Obesity    OSA (obstructive sleep apnea)    OSA (obstructive sleep apnea) 05/07/2015   Right carotid artery occlusion    Chronic RICA occlusion, no stenosis LICA on [id number] carotid Duplex    Past Surgical History:  Procedure Laterality Date   ATRIAL FIBRILLATION ABLATION N/A 11/16/2021   Procedure: ATRIAL FIBRILLATION ABLATION;  Surgeon: Vickie Epley, MD;  Location: [place] [facility];  Service: Cardiovascular;  Laterality: N/A;   CARDIOVERSION N/A 06/03/2015   Procedure: CARDIOVERSION;  Surgeon: Josue Hector, MD;  Location: [place];  Service: Cardiovascular;  Laterality: N/A;   CARDIOVERSION N/A 05/11/2017   Procedure: CARDIOVERSION;  Surgeon: Josue Hector, MD;   Location: [facility] ENDOSCOPY;  Service: Cardiovascular;  Laterality: N/A;   CARDIOVERSION N/A 04/11/2020   Procedure: CARDIOVERSION;  Surgeon: Fay Records, MD;  Location: [facility] ENDOSCOPY;  Service: Cardiovascular;  Laterality: N/A;   COLONOSCOPY     ESOPHAGOGASTRODUODENOSCOPY N/A 04/23/2021   Procedure: ESOPHAGOGASTRODUODENOSCOPY (EGD);  Surgeon: Lajuana Matte, MD;  Location: [facility] OR;  Service: Thoracic;  Laterality: N/A;   TONSILLECTOMY     XI ROBOTIC ASSISTED PARAESOPHAGEAL HERNIA REPAIR N/A 04/23/2021   Procedure: XI ROBOTIC ASSISTED PARAESOPHAGEAL HERNIA REPAIR WITH FUNDOPLICATION USING [place];  Surgeon: Lajuana Matte, MD;  Location: [place];  Service: Thoracic;  Laterality: N/A;    Family History  Problem Relation Age of Onset   Atrial fibrillation Mother    Cancer Father        BONE   Heart disease Father    Prostate cancer Other      Social History   Tobacco Use  Smoking Status Never  Smokeless Tobacco Never    Social History   Substance and Sexual Activity  Alcohol Use No   Alcohol/week: 0.0 standard drinks     Allergies  Allergen Reactions   Ciprofloxacin Swelling and Other (See Comments)    Pt states he took med for 3 days and ankle  swelling started.      Current Outpatient Medications  Medication Sig Dispense Refill   acetaminophen (TYLENOL) 500 MG tablet Take 2 tablets (1,000 mg total) by mouth every 6 (six) hours. 30 tablet 0   dofetilide (TIKOSYN) 250 MCG capsule Take 1 capsule (250 mcg total) by mouth 2 (two) times daily. 60 capsule 6   enalapril (VASOTEC) 20 MG tablet Take 20 mg by mouth daily.     metoprolol succinate (TOPROL XL) 25 MG 24 hr tablet Take 1 tablet (25 mg total) by mouth 2 (two) times daily. 60 tablet 6   potassium chloride (KLOR-CON) 10 MEQ tablet Take 2 tablets (20 mEq total) by mouth daily. 180 tablet 3   rivaroxaban (XARELTO) 20 MG TABS tablet Take 1 tablet (20 mg total) by mouth daily. 30 tablet 6    tamsulosin (FLOMAX) 0.4 MG CAPS capsule Take 0.4 mg by mouth daily.     Vitamin D, Ergocalciferol, (DRISDOL) 1.25 MG (50000 UNIT) CAPS capsule Take 50,000 Units by mouth every 30 (thirty) days.     No current facility-administered medications for this visit.    Review of Systems  Constitutional:  Positive for weight loss.  Respiratory:  Positive for shortness of breath.   Cardiovascular:  Positive for chest pain.  Gastrointestinal:  Positive for heartburn, nausea and vomiting.   PHYSICAL EXAMINATION: BP 122/69 (BP Location: Left Arm, Patient Position: Sitting)   Pulse (!) 54   Resp 20   Ht 5\' 8"  (1.727 m)   Wt 166 lb (75.3 kg)   SpO2 96% Comment: RA  BMI 25.24 kg/m   Physical Exam Constitutional:      General: He is not in acute distress.    Appearance: He is normal weight.  HENT:     Head: Normocephalic and atraumatic.  Eyes:     Extraocular Movements: Extraocular movements intact.  Cardiovascular:     Rate and Rhythm: Tachycardia present.  Pulmonary:     Effort: Pulmonary effort is normal.  Abdominal:     General: Abdomen is flat. There is no distension.  Musculoskeletal:     Cervical back: Normal range of motion.  Neurological:     Mental Status: He is alert.     Diagnostic Studies & Laboratory data:     Recent Radiology Findings:   CT CHEST WO CONTRAST  Result Date: 05/31/2022 CLINICAL DATA:  Status post repair of paraesophageal hernia. EXAM: CT CHEST WITHOUT CONTRAST TECHNIQUE: Multidetector CT imaging of the chest was performed following the standard protocol without IV contrast. RADIATION DOSE REDUCTION: This exam was performed according to the departmental dose-optimization program which includes automated exposure control, adjustment of the mA and/or kV according to patient size and/or use of iterative reconstruction technique. COMPARISON:  Cardiac CT/CTA 03/17/2021 FINDINGS: Cardiovascular: Mild cardiac enlargement. No pericardial effusion. Aortic  atherosclerosis. Coronary artery calcifications. Mediastinum/Nodes: No enlarged mediastinal or axillary lymph nodes. Thyroid gland, trachea, and esophagus demonstrate no significant findings. Lungs/Pleura: No pleural effusion, airspace consolidation, atelectasis or pneumothorax. Subpleural right middle lobe nodule abutting the major fissure measures 5 mm. Unchanged compared with 03/17/2021. Nodule within the right base measures 3 mm and is also unchanged from the previous exam, image 117/8. There are no new or suspicious lung nodules. Upper Abdomen: No acute abnormality. Stable low-density structure in the posterior right lobe of liver measuring 8 mm. There has been interval recurrence the patient's hiatal hernia which measures approximately 10.6 x 5.2 by 8.1 cm, image 106/2. Musculoskeletal: No chest wall mass or suspicious  bone lesions identified. IMPRESSION: 1. Interval recurrence of large hiatal hernia. 2. Small right lung nodules are unchanged from 03/17/2021 and are compatible with a benign abnormality.No routine follow-up imaging is recommended per Fleischner Society Guidelines. These guidelines do not apply to immunocompromised patients and patients with cancer. Follow up in patients with significant comorbidities as clinically warranted. For lung cancer screening, adhere to Lung-RADS guidelines. Reference: Radiology. 2017; 284(1):228-43. 3. Coronary artery calcifications. 4. Aortic Atherosclerosis (ICD10-I70.0). Electronically Signed   By: Kerby Moors M.D.   On: 05/31/2022 12:45       I have independently reviewed the above radiology studies  and reviewed the findings with the patient.   Recent Lab Findings: Lab Results  Component Value Date   WBC 5.8 10/23/2021   HGB 13.5 10/23/2021   HCT 41.4 10/23/2021   PLT 227 10/23/2021   GLUCOSE 89 05/20/2022   ALT 10 04/21/2021   AST 21 04/21/2021   NA 143 05/20/2022   K 4.7 05/20/2022   CL 103 05/20/2022   CREATININE 1.02 05/20/2022   BUN 15  05/20/2022   CO2 26 05/20/2022   TSH 1.563 05/09/2017   INR 1.2 04/21/2021        Assessment / Plan:   66 year old male with a recurrent paraesophageal hernia.  This originally was repaired with ACell mesh.  He also underwent a toupet fundoplication.  He is agreeable to proceed with a redo hernia repair.  This will be performed in conjunction with an EGD.  He will hold his Xarelto for 2 days.  He is scheduled for 06/03/2022.      Lajuana Matte 05/31/2022 2:30 PM

## 2022-06-01 NOTE — Pre-Procedure Instructions (Signed)
Surgical Instructions    Your procedure is scheduled on Thursday 06/03/22.   Report to [facility] Main Entrance "A" at 05:30 A.M., then check in with the Admitting office.  Call this number if you have problems the morning of surgery:  [phone]   If you have any questions prior to your surgery date call [phone]: Open Monday-Friday 8am-4pm    Remember:  Do not eat or drink after midnight the night before your surgery    Take these medicines the morning of surgery with A SIP OF WATER:   dofetilide (TIKOSYN)   metoprolol succinate (TOPROL XL)  potassium chloride (KLOR-CON)  tamsulosin (FLOMAX)    Take these medicines if needed:   acetaminophen (TYLENOL)   Please follow your surgeon's instructions regarding rivaroxaban (XARELTO). If you have not received instructions then please contact your surgeon's office for instructions.   As of today, STOP taking any Aspirin (unless otherwise instructed by your surgeon) Aleve, Naproxen, Ibuprofen, Motrin, Advil, Goody's, BC's, all herbal medications, fish oil, and all vitamins.           Do not wear jewelry or makeup Do not wear lotions, powders, perfumes/colognes, or deodorant. Do not shave 48 hours prior to surgery.  Men may shave face and neck. Do not bring valuables to the hospital. Do not wear nail polish, gel polish, artificial nails, or any other type of covering on natural nails (fingers and toes) If you have artificial nails or gel coating that need to be removed by a nail salon, please have this removed prior to surgery. Artificial nails or gel coating may interfere with anesthesia's ability to adequately monitor your vital signs.  [facility] is not responsible for any belongings or valuables. .   Do NOT Smoke (Tobacco/Vaping)  24 hours prior to your procedure  If you use a CPAP at night, you may bring your mask for your overnight stay.   Contacts, glasses, hearing aids, dentures or partials may not be worn into surgery,  please bring cases for these belongings   For patients admitted to the hospital, discharge time will be determined by your treatment team.   Patients discharged the day of surgery will not be allowed to drive home, and someone needs to stay with them for 24 hours.   SURGICAL WAITING ROOM VISITATION Patients having surgery or a procedure in a hospital may have two support people. Children under the age of 75 must have an adult with them who is not the patient. They may stay in the waiting area during the procedure and may switch out with other visitors. If the patient needs to stay at the hospital during part of their recovery, the visitor guidelines for inpatient rooms apply.  Please refer to the [facility] website for the visitor guidelines for Inpatients (after your surgery is over and you are in a regular room).       Special instructions:    Oral Hygiene is also important to reduce your risk of infection.  Remember - BRUSH YOUR TEETH THE MORNING OF SURGERY WITH YOUR REGULAR TOOTHPASTE   [facility]- Preparing For Surgery  Before surgery, you can play an important role. Because skin is not sterile, your skin needs to be as free of germs as possible. You can reduce the number of germs on your skin by washing with CHG (chlorahexidine gluconate) Soap before surgery.  CHG is an antiseptic cleaner which kills germs and bonds with the skin to continue killing germs even after washing.  Please do not use if you have an allergy to CHG or antibacterial soaps. If your skin becomes reddened/irritated stop using the CHG.  Do not shave (including legs and underarms) for at least 48 hours prior to first CHG shower. It is OK to shave your face.  Please follow these instructions carefully.     Shower the NIGHT BEFORE SURGERY and the MORNING OF SURGERY with CHG Soap.   If you chose to wash your hair, wash your hair first as usual with your normal shampoo. After you shampoo, rinse your hair  and body thoroughly to remove the shampoo.  Then [employer] and genitals (private parts) with your normal soap and rinse thoroughly to remove soap.  After that Use CHG Soap as you would any other liquid soap. You can apply CHG directly to the skin and wash gently with a scrungie or a clean washcloth.   Apply the CHG Soap to your body ONLY FROM THE NECK DOWN.  Do not use on open wounds or open sores. Avoid contact with your eyes, ears, mouth and genitals (private parts). Wash Face and genitals (private parts)  with your normal soap.   Wash thoroughly, paying special attention to the area where your surgery will be performed.  Thoroughly rinse your body with warm water from the neck down.  DO NOT shower/wash with your normal soap after using and rinsing off the CHG Soap.  Pat yourself dry with a CLEAN TOWEL.  Wear CLEAN PAJAMAS to bed the night before surgery  Place CLEAN SHEETS on your bed the night before your surgery  DO NOT SLEEP WITH PETS.   Day of Surgery:  Take a shower with CHG soap. Wear Clean/Comfortable clothing the morning of surgery Do not apply any deodorants/lotions.   Remember to brush your teeth WITH YOUR REGULAR TOOTHPASTE.    If you received a COVID test during your pre-op visit, it is requested that you wear a mask when out in public, stay away from anyone that may not be feeling well, and notify your surgeon if you develop symptoms. If you have been in contact with anyone that has tested positive in the last 10 days, please notify your surgeon.    Please read over the following fact sheets that you were given.

## 2022-06-02 ENCOUNTER — Other Ambulatory Visit: Payer: Self-pay

## 2022-06-02 ENCOUNTER — Ambulatory Visit (HOSPITAL_COMMUNITY)
Admission: RE | Admit: 2022-06-02 | Discharge: 2022-06-02 | Disposition: A | Discharge status: Home / Self Care | Payer: Medicare PPO | Source: Ambulatory Visit | Attending: Thoracic Surgery (Cardiothoracic Vascular Surgery) | Admitting: Thoracic Surgery (Cardiothoracic Vascular Surgery)

## 2022-06-02 ENCOUNTER — Encounter (HOSPITAL_COMMUNITY)
Admission: RE | Admit: 2022-06-02 | Discharge: 2022-06-02 | Disposition: A | Discharge status: Home / Self Care | Payer: Medicare PPO | Source: Ambulatory Visit | Attending: Thoracic Surgery (Cardiothoracic Vascular Surgery) | Admitting: Thoracic Surgery (Cardiothoracic Vascular Surgery)

## 2022-06-02 ENCOUNTER — Encounter (HOSPITAL_COMMUNITY): Payer: Self-pay

## 2022-06-02 VITALS — BP 106/78 | HR 53 | Temp 97.7°F | Resp 18 | Ht 66.0 in | Wt 163.5 lb

## 2022-06-02 DIAGNOSIS — Z20822 Contact with and (suspected) exposure to covid-19: Secondary | ICD-10-CM | POA: Insufficient documentation

## 2022-06-02 DIAGNOSIS — G4733 Obstructive sleep apnea (adult) (pediatric): Secondary | ICD-10-CM | POA: Diagnosis not present

## 2022-06-02 DIAGNOSIS — K449 Diaphragmatic hernia without obstruction or gangrene: Secondary | ICD-10-CM | POA: Diagnosis present

## 2022-06-02 DIAGNOSIS — E785 Hyperlipidemia, unspecified: Secondary | ICD-10-CM | POA: Diagnosis not present

## 2022-06-02 DIAGNOSIS — Z8673 Personal history of transient ischemic attack (TIA), and cerebral infarction without residual deficits: Secondary | ICD-10-CM | POA: Diagnosis not present

## 2022-06-02 DIAGNOSIS — I4510 Unspecified right bundle-branch block: Secondary | ICD-10-CM | POA: Insufficient documentation

## 2022-06-02 DIAGNOSIS — K219 Gastro-esophageal reflux disease without esophagitis: Secondary | ICD-10-CM | POA: Insufficient documentation

## 2022-06-02 DIAGNOSIS — Z01818 Encounter for other preprocedural examination: Secondary | ICD-10-CM | POA: Diagnosis present

## 2022-06-02 DIAGNOSIS — I10 Essential (primary) hypertension: Secondary | ICD-10-CM | POA: Diagnosis not present

## 2022-06-02 DIAGNOSIS — I4891 Unspecified atrial fibrillation: Secondary | ICD-10-CM | POA: Diagnosis not present

## 2022-06-02 HISTORY — DX: Personal history of other diseases of the digestive system: Z87.19

## 2022-06-02 LAB — COMPREHENSIVE METABOLIC PANEL
ALT: 10 U/L (ref 0–44)
AST: 21 U/L (ref 15–41)
Albumin: 4 g/dL (ref 3.5–5.0)
Alkaline Phosphatase: 70 U/L (ref 38–126)
Anion gap: 8 (ref 5–15)
BUN: 17 mg/dL (ref 8–23)
CO2: 21 mmol/L — ABNORMAL LOW (ref 22–32)
Calcium: 10 mg/dL (ref 8.9–10.3)
Chloride: 111 mmol/L (ref 98–111)
Creatinine, Ser: 0.98 mg/dL (ref 0.61–1.24)
GFR, Estimated: >60 mL/min (ref >60)
Glucose, Bld: 128 mg/dL — ABNORMAL HIGH (ref 70–99)
Potassium: 4.4 mmol/L (ref 3.5–5.1)
Sodium: 140 mmol/L (ref 135–145)
Total Bilirubin: 1.3 mg/dL — ABNORMAL HIGH (ref 0.3–1.2)
Total Protein: 6.9 g/dL (ref 6.5–8.1)

## 2022-06-02 LAB — TYPE AND SCREEN
ABO/RH(D): A POS
Antibody Screen: NEGATIVE

## 2022-06-02 LAB — SURGICAL PCR SCREEN
MRSA, PCR: NEGATIVE
Staphylococcus aureus: NEGATIVE

## 2022-06-02 LAB — CBC
HCT: 45.7 % (ref 39.0–52.0)
Hemoglobin: 14.7 g/dL (ref 13.0–17.0)
MCH: 28.8 pg (ref 26.0–34.0)
MCHC: 32.2 g/dL (ref 30.0–36.0)
MCV: 89.6 fL (ref 80.0–100.0)
Platelets: 225 10*3/uL (ref 150–400)
RBC: 5.1 MIL/uL (ref 4.22–5.81)
RDW: 12.1 % (ref 11.5–15.5)
WBC: 6.1 10*3/uL (ref 4.0–10.5)
nRBC: 0 % (ref 0.0–0.2)

## 2022-06-02 LAB — APTT: aPTT: 30 seconds (ref 24–36)

## 2022-06-02 LAB — BLOOD GAS, ARTERIAL
Acid-Base Excess: 2.9 mmol/L — ABNORMAL HIGH (ref 0.0–2.0)
Bicarbonate: 25.7 mmol/L (ref 20.0–28.0)
Drawn by: 58793
O2 Saturation: 99.3 %
Patient temperature: 37
pCO2 arterial: 33 mmHg (ref 32–48)
pH, Arterial: 7.5 — ABNORMAL HIGH (ref 7.35–7.45)
pO2, Arterial: 112 mmHg — ABNORMAL HIGH (ref 83–108)

## 2022-06-02 LAB — PROTIME-INR
INR: 1.1 (ref 0.8–1.2)
Prothrombin Time: 14.3 seconds (ref 11.4–15.2)

## 2022-06-02 LAB — URINALYSIS, ROUTINE W REFLEX MICROSCOPIC
Bilirubin Urine: NEGATIVE
Glucose, UA: NEGATIVE mg/dL
Hgb urine dipstick: NEGATIVE
Ketones, ur: NEGATIVE mg/dL
Nitrite: NEGATIVE
Protein, ur: NEGATIVE mg/dL
Specific Gravity, Urine: 1.026 (ref 1.005–1.030)
pH: 5 (ref 5.0–8.0)

## 2022-06-02 LAB — SARS CORONAVIRUS 2 BY RT PCR: SARS Coronavirus 2 by RT PCR: NEGATIVE

## 2022-06-02 NOTE — Progress Notes (Addendum)
PCP - Marrianne Mood Cardiologist - Dr Johnsie Cancel EP- Dr. Quentin Ore- recent ablation 10/2021- Pt reports he has had some episodes of Afib since. Pt takes Tikosyn and Metroprolol.   PPM/ICD - denies Chest x-ray - 06/02/2022  Chest CT- 05/31/22 EKG - 05/20/22, requested 05/23/22 EKG from [facility] Stress Test - 01/11/19 ECHO - 03/06/21 Cardiac Cath - denies  Sleep Study - 06/16/15- pt does not wear CPAP  Blood Thinner Instructions: Last dose of Xarelto 05/31/22  ERAS Protcol - NPO order  COVID TEST- 06/02/2022 pending   Anesthesia review: recent Afib ablation, recent visit to ED for chest pain and vomiting at [facility]- Patient reports he has seen Dr. Kipp Brood since his chest pain episode and was scheduled for hiatal hernia repair. Pt reports he has not been able to eat without throwing up and having chest pain since (same symptoms, no new symptoms). Pt reports he can drink fluids and has only been able to keep a milkshake down. Pt states he has no trouble swallowing.   Pt states that he will call and let Dr. Quentin Ore know about his surgery tomorrow.   Patient denies shortness of breath, fever, cough and chest pain at PAT appointment   All instructions explained to the patient, with a verbal understanding of the material. Patient agrees to go over the instructions while at home for a better understanding. Patient also instructed to self quarantine after being tested for COVID-19. The opportunity to ask questions was provided.

## 2022-06-02 NOTE — Progress Notes (Signed)
Anesthesia Chart Review:  Case: [id number] Date/Time: 06/03/22 0715   Procedures:      XI ROBOTIC ASSISTED HIATAL HERNIA REPAIR (Chest)     ESOPHAGOGASTRODUODENOSCOPY (EGD)   Anesthesia type: General   Pre-op diagnosis: HIATAL HERNIA   Location: [facility] OR ROOM 10 / [place] [facility]   Surgeons: Lajuana Matte, MD       DISCUSSION: Patient is a 66 year old male scheduled for the above procedure. He is s/p robotic assisted laparoscopy, paraesophageal hernia repair with ACell mesh buttress, and Toupet fundoplication by Dr. Kipp Brood on 04/23/22. This was done prior to him undergoing afib ablation on 11/16/21 due to the large hiatal hernia's proximity to the left pulmonary vein. Unfortunately, on 05/23/22 he had an episode of chest pain about one hour after eating and was relieved by vomiting and CXR showed recurrence of a large hiatal hernia. His EKG was stable, troponin negative x2, CBC and CMP unremarkable. He was discharged with PCP follow-up and seen by Gennette Pac, NP on 05/28/22 for follow-up. He had recurrent episode of chest pain after eating, relieved with vomiting but adopted a soft diet and had not had further episodes of dysphagia or chest pain. She resumed his PPI and referred him back to CT surgery given symptoms with recurrent large hiatal hernia.  He was evaluated by Dr. Kipp Brood on 05/31/22 with chest CT, and redo hiatal hernia repair recommended in conjunction with an EGD. He advised patient to hold Xarelto for 2 days. He is primarily only able to keep fluids/milkshakes down or he will throw up.   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 It sounds like he had an episode of bronchospasm  or allergic reaction to Cipro ("throat closed up") after anesthesia in the  past. + COVID-19 12/16/20 (s/p MAB infusion 12/22/20).   Last EP follow-up was on 05/20/22 with Barrington Ellison, PA-C. He had symptomatic A tach early on following ablation, but had since been doing well on Tikosyn and Xarelto. Also on Toprol. 3 month follow-up planned. Patient wanted to let Dr. Quentin Ore know about surgery plans, so he was planning to contact the office.   06/02/22 CXR and COVID-19 test are in process. Anesthesia team to evaluate on the day of surgery.    VS: BP 106/78   Pulse (!) 53   Temp 36.5 C (Oral)   Resp 18   Ht 5\' 6"  (1.676 m)   Wt 74.2 kg   SpO2 97%   BMI 26.39 kg/m    PROVIDERS: Gennette Pac, NP is PCP  Lars Mage, MD is EP cardiologist. Also sees Malka So, PA at the [facility]. Last EP visit 05/20/22 with APP.  Jenkins Rouge, MD is primary cardiologist. Last visit 01/27/21.    LABS: Preoperative labs reviewed.  (all labs ordered are listed, but only abnormal results are displayed)  Labs Reviewed  COMPREHENSIVE METABOLIC PANEL - Abnormal; Notable for the following components:      Result Value   CO2 21 (*)    Glucose, Bld 128 (*)    Total Bilirubin 1.3 (*)    All other components within normal limits  BLOOD GAS, ARTERIAL - Abnormal; Notable for the following components:   pH, Arterial 7.5 (*)    pO2, Arterial 112 (*)    Acid-Base Excess 2.9 (*)  All other components within normal limits  URINALYSIS, ROUTINE W REFLEX MICROSCOPIC - Abnormal; Notable for the following components:   Color, Urine AMBER (*)    APPearance HAZY (*)    Leukocytes,Ua SMALL (*)    Bacteria, UA RARE (*)    All other components within normal limits  SURGICAL PCR SCREEN  SARS CORONAVIRUS 2 BY RT PCR  CBC  PROTIME-INR  APTT  TYPE AND SCREEN     IMAGES: CXR 06/02/22: In process.  CT Chest 05/31/22: IMPRESSION: 1. Interval recurrence of large hiatal hernia. 2. Small right lung nodules are unchanged from 03/17/2021 and are compatible with a benign  abnormality.No routine follow-up imaging is recommended per Fleischner Society Guidelines. These guidelines do not apply to immunocompromised patients and patients with cancer. Follow up in patients with significant comorbidities as clinically warranted. For lung cancer screening, adhere to Lung-RADS guidelines. Reference: Radiology. 2017; 284(1):228-43. 3. Coronary artery calcifications. 4. Aortic Atherosclerosis (ICD10-I70.0).   EKG: EKG 05/23/22 [facility]): Tracing requested.  SINUS BRADYCARDIA at 51 bpm LEFT AXIS DEVIATION  INCOMPLETE RIGHT BUNDLE BRANCH BLOCK  WHEN COMPARED WITH ECG OF 05-23-22 14:47  AXIS SHIFT TO LEFT  RATE HAS DECREASED  Confirmed by Tor Netters [id number]) on 05/24/2022 1:02:33 PM  EKG 05/20/22 ([facility]): NSR. Borderline LAD. QT 428 ms, QTc 430 ms.    CV: CT Cardiac 03/17/21: IMPRESSION: 1.  Moderate LAE normal RA No LAA thrombus 2.  Normal PV anatomy with common ostium RLPV/RMPV 3.  No ASD/PFO 4.  Normal aortic root 3.7 cm 5.  No pericardial effusion 6.  Calcium score 56 which is 84 st percentile for age / sex 64. Very large hiatal hernia that impinges on the lateral LA wall and is adjacent to the LLPV ostium     Echo 03/06/21: IMPRESSIONS   1. Left ventricular ejection fraction, by estimation, is 60 to 65%. The  left ventricle has normal function. The left ventricle has no regional  wall motion abnormalities. There is mild concentric left ventricular  hypertrophy. Left ventricular diastolic  parameters are consistent with Grade I diastolic dysfunction (impaired  relaxation).   2. Right ventricular systolic function is normal. The right ventricular  size is normal. There is normal pulmonary artery systolic pressure. The  estimated right ventricular systolic pressure is [id number] mmHg.   3. Left atrial size was moderately dilated.   4. The mitral valve is grossly normal. Trivial mitral valve  regurgitation.   5. The aortic valve is tricuspid. Aortic  valve regurgitation is not  visualized.   6. The inferior vena cava is normal in size with greater than 50%  respiratory variability, suggesting right atrial pressure of 3 mmHg.  - Comparison(s): No prior Echocardiogram. Unable to access report or images  from 03/10/2006 or 1998 study.      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  Left Carotid: There was no evidence of thrombus, dissection,  atherosclerotic plaque or stenosis in the cervical carotid system.  Vertebrals: Bilateral vertebral arteries demonstrate antegrade flow.      Nuclear stress test 01/11/19: Nuclear stress EF: 55%. The left ventricular ejection fraction is normal (55-65%). There was no ST segment deviation noted during stress. The study is normal. This is a low risk study. Normal stress nuclear study with no ischemia or infarction; EF 55 with normal wall motion.   Past Medical History:  Diagnosis Date   A-fib [facility])    Complication of  anesthesia    "waking up, throat closed up"- d/t cipro per patient   CVA (cerebral vascular accident) ([place]) 2000   GERD (gastroesophageal reflux disease)    History of hiatal hernia    History of kidney stones    HLD (hyperlipidemia)    HTN (hypertension)    Obesity    OSA (obstructive sleep apnea)    OSA (obstructive sleep apnea) 05/07/2015   Right carotid artery occlusion    Chronic RICA occlusion, no stenosis LICA on [id number] carotid Duplex    Past Surgical History:  Procedure Laterality Date   ATRIAL FIBRILLATION ABLATION N/A 11/16/2021   Procedure: ATRIAL FIBRILLATION ABLATION;  Surgeon: Vickie Epley, MD;  Location: [place] CV LAB;  Service: Cardiovascular;  Laterality: N/A;   CARDIOVERSION N/A 06/03/2015   Procedure: CARDIOVERSION;  Surgeon: Josue Hector, MD;  Location: [place];  Service: Cardiovascular;  Laterality: N/A;   CARDIOVERSION N/A 05/11/2017   Procedure:  CARDIOVERSION;  Surgeon: Josue Hector, MD;  Location: [facility] ENDOSCOPY;  Service: Cardiovascular;  Laterality: N/A;   CARDIOVERSION N/A 04/11/2020   Procedure: CARDIOVERSION;  Surgeon: Fay Records, MD;  Location: [facility] ENDOSCOPY;  Service: Cardiovascular;  Laterality: N/A;   COLONOSCOPY     ESOPHAGOGASTRODUODENOSCOPY N/A 04/23/2021   Procedure: ESOPHAGOGASTRODUODENOSCOPY (EGD);  Surgeon: Lajuana Matte, MD;  Location: [facility] OR;  Service: Thoracic;  Laterality: N/A;   HEMORRHOID SURGERY     2022   TONSILLECTOMY     XI ROBOTIC ASSISTED PARAESOPHAGEAL HERNIA REPAIR N/A 04/23/2021   Procedure: XI ROBOTIC ASSISTED PARAESOPHAGEAL HERNIA REPAIR WITH FUNDOPLICATION USING ACELL GENTRIX SURGICAL MATRIX;  Surgeon: Lajuana Matte, MD;  Location: [place];  Service: Thoracic;  Laterality: N/A;    MEDICATIONS:  acetaminophen (TYLENOL) 500 MG tablet   dofetilide (TIKOSYN) 250 MCG capsule   enalapril (VASOTEC) 20 MG tablet   metoprolol succinate (TOPROL XL) 25 MG 24 hr tablet   potassium chloride (KLOR-CON) 10 MEQ tablet   rivaroxaban (XARELTO) 20 MG TABS tablet   tamsulosin (FLOMAX) 0.4 MG CAPS capsule   Vitamin D, Ergocalciferol, (DRISDOL) 1.25 MG (50000 UNIT) CAPS capsule   No current facility-administered medications for this encounter.    Myra Gianotti, PA-C Surgical Short Stay/Anesthesiology [facility] Phone [phone] [facility] Phone [phone] 06/02/2022 2:13 PM

## 2022-06-02 NOTE — Anesthesia Preprocedure Evaluation (Addendum)
Anesthesia Evaluation  Patient identified by MRN, date of birth, ID band Patient awake    Reviewed: Allergy & Precautions, NPO status , Patient's Chart, lab work & pertinent test results  History of Anesthesia Complications Negative for: history of anesthetic complications  Airway Mallampati: II  TM Distance: >3 FB Neck ROM: Full    Dental no notable dental hx. (+) Dental Advisory Given   Pulmonary sleep apnea ,    Pulmonary exam normal        Cardiovascular hypertension, Pt. on medications (-) angina(-) Past MI and (-) CHF Normal cardiovascular exam+ dysrhythmias Atrial Fibrillation   Echo 02/2021 - Left ventricle: The cavity size was normal. Wall Echo thickness wasnormal. Systolic function was normal. The estimated ejection fraction was in the range of 55% to 60%. Wall motion was normal;there were no regional wall motion abnormalities. Dopplerparameters are consistent with abnormal left ventricularrelaxation (grade 1 diastolic dysfunction).   Impressions:  - Normal LV systolic function; mild diastolic dysfunction; trace MR and TR.      Nuclear stress EF: 55%.  The left ventricular ejection fraction is normal (55-65%).  There was no ST segment deviation noted during stress.  The study is normal.  This is a low risk study.   Normal stress nuclear study with no ischemia or infarction; EF 55 with normal wall motion.    Neuro/Psych Stroke in 2000 CVA negative psych ROS   GI/Hepatic hiatal hernia, GERD  ,  Endo/Other  negative endocrine ROS  Renal/GU negative Renal ROS     Musculoskeletal  (+) Arthritis ,   Abdominal   Peds  Hematology xarelto for afib  Lab Results      Component                Value               Date                      WBC                      5.9                 04/21/2021                HGB                      13.3                04/21/2021                HCT                       42.4                04/21/2021                MCV                      87.4                04/21/2021                PLT                      200                 04/21/2021  Anesthesia Other Findings   Reproductive/Obstetrics                            Anesthesia Physical  Anesthesia Plan  ASA: 3  Anesthesia Plan: General   Post-op Pain Management: Tylenol PO (pre-op)* and Celebrex PO (pre-op)*   Induction: Intravenous  PONV Risk Score and Plan: 3 and Ondansetron, Dexamethasone, Treatment may vary due to age or medical condition and Diphenhydramine  Airway Management Planned: Oral ETT and Double Lumen EBT  Additional Equipment: ClearSight  Intra-op Plan:   Post-operative Plan: Extubation in OR  Informed Consent: I have reviewed the patients History and Physical, chart, labs and discussed the procedure including the risks, benefits and alternatives for the proposed anesthesia with the patient or authorized representative who has indicated his/her understanding and acceptance.     Dental advisory given  Plan Discussed with: Anesthesiologist and CRNA  Anesthesia Plan Comments: ( )       Anesthesia Quick Evaluation

## 2022-06-02 NOTE — Anesthesia Preprocedure Evaluation (Deleted)
Anesthesia Evaluation    Airway        Dental   Pulmonary           Cardiovascular hypertension,      Neuro/Psych    GI/Hepatic   Endo/Other    Renal/GU      Musculoskeletal   Abdominal   Peds  Hematology   Anesthesia Other Findings   Reproductive/Obstetrics                             Anesthesia Physical Anesthesia Plan  ASA:   Anesthesia Plan:    Post-op Pain Management:    Induction:   PONV Risk Score and Plan:   Airway Management Planned:   Additional Equipment:   Intra-op Plan:   Post-operative Plan:   Informed Consent:   Plan Discussed with:   Anesthesia Plan Comments: (PAT note written 06/02/2022 by Myra Gianotti, PA-C. )        Anesthesia Quick Evaluation

## 2022-06-03 ENCOUNTER — Observation Stay (HOSPITAL_COMMUNITY)
Admission: RE | Admit: 2022-06-03 | Discharge: 2022-06-04 | Disposition: A | Discharge status: Home / Self Care | Payer: Medicare PPO | Attending: Thoracic Surgery (Cardiothoracic Vascular Surgery) | Admitting: Thoracic Surgery (Cardiothoracic Vascular Surgery)

## 2022-06-03 ENCOUNTER — Other Ambulatory Visit: Payer: Self-pay

## 2022-06-03 ENCOUNTER — Encounter (HOSPITAL_COMMUNITY)
Admission: RE | Disposition: A | Discharge status: Home / Self Care | Payer: Self-pay | Source: Home / Self Care | Attending: Thoracic Surgery (Cardiothoracic Vascular Surgery)

## 2022-06-03 ENCOUNTER — Inpatient Hospital Stay (HOSPITAL_COMMUNITY): Payer: Medicare PPO | Admitting: Vascular Surgery

## 2022-06-03 ENCOUNTER — Encounter (HOSPITAL_COMMUNITY): Payer: Self-pay | Admitting: Thoracic Surgery (Cardiothoracic Vascular Surgery)

## 2022-06-03 ENCOUNTER — Inpatient Hospital Stay (HOSPITAL_COMMUNITY): Payer: Medicare PPO | Admitting: Anesthesiology

## 2022-06-03 DIAGNOSIS — K219 Gastro-esophageal reflux disease without esophagitis: Secondary | ICD-10-CM | POA: Diagnosis not present

## 2022-06-03 DIAGNOSIS — R1310 Dysphagia, unspecified: Secondary | ICD-10-CM | POA: Diagnosis not present

## 2022-06-03 DIAGNOSIS — I4891 Unspecified atrial fibrillation: Secondary | ICD-10-CM

## 2022-06-03 DIAGNOSIS — K449 Diaphragmatic hernia without obstruction or gangrene: Secondary | ICD-10-CM

## 2022-06-03 DIAGNOSIS — I10 Essential (primary) hypertension: Secondary | ICD-10-CM | POA: Diagnosis not present

## 2022-06-03 DIAGNOSIS — Z8616 Personal history of COVID-19: Secondary | ICD-10-CM | POA: Diagnosis not present

## 2022-06-03 DIAGNOSIS — Z79899 Other long term (current) drug therapy: Secondary | ICD-10-CM | POA: Diagnosis not present

## 2022-06-03 DIAGNOSIS — G4730 Sleep apnea, unspecified: Secondary | ICD-10-CM | POA: Diagnosis not present

## 2022-06-03 DIAGNOSIS — Z8719 Personal history of other diseases of the digestive system: Principal | ICD-10-CM

## 2022-06-03 HISTORY — PX: ESOPHAGOGASTRODUODENOSCOPY: SHX5428

## 2022-06-03 HISTORY — PX: XI ROBOTIC ASSISTED HIATAL HERNIA REPAIR: SHX6889

## 2022-06-03 SURGERY — REPAIR, HERNIA, HIATAL, ROBOT-ASSISTED
Anesthesia: General | Site: Chest

## 2022-06-03 MED ORDER — PANTOPRAZOLE SODIUM 40 MG IV SOLR
40.0000 mg | Freq: Every day | INTRAVENOUS | Status: DC
  Administered 2022-06-03: 40 mg via INTRAVENOUS
  Filled 2022-06-03: qty 10

## 2022-06-03 MED ORDER — DIPHENHYDRAMINE HCL 50 MG/ML IJ SOLN
INTRAMUSCULAR | Status: DC | PRN
  Administered 2022-06-03: 12.5 mg via INTRAVENOUS

## 2022-06-03 MED ORDER — ALBUMIN HUMAN 5 % IV SOLN: INTRAVENOUS | Status: DC | PRN

## 2022-06-03 MED ORDER — LIDOCAINE 2% (20 MG/ML) 5 ML SYRINGE
INTRAMUSCULAR | Status: DC | PRN
  Administered 2022-06-03: 80 mg via INTRAVENOUS

## 2022-06-03 MED ORDER — AMISULPRIDE (ANTIEMETIC) 5 MG/2ML IV SOLN
INTRAVENOUS | Status: AC
  Filled 2022-06-03: qty 2

## 2022-06-03 MED ORDER — ONDANSETRON HCL 4 MG/2ML IJ SOLN: 4.0000 mg | Freq: Four times a day (QID) | INTRAMUSCULAR | Status: DC | PRN

## 2022-06-03 MED ORDER — KETOROLAC TROMETHAMINE 15 MG/ML IJ SOLN
15.0000 mg | Freq: Four times a day (QID) | INTRAMUSCULAR | Status: AC
  Administered 2022-06-04: 15 mg via INTRAVENOUS
  Filled 2022-06-03: qty 1

## 2022-06-03 MED ORDER — RIVAROXABAN 20 MG PO TABS: 20.0000 mg | ORAL_TABLET | Freq: Every day | ORAL | Status: DC

## 2022-06-03 MED ORDER — DOFETILIDE 250 MCG PO CAPS
250.0000 ug | ORAL_CAPSULE | Freq: Two times a day (BID) | ORAL | Status: DC
  Administered 2022-06-03 – 2022-06-04 (×2): 250 ug via ORAL
  Filled 2022-06-03 (×3): qty 1

## 2022-06-03 MED ORDER — METOPROLOL SUCCINATE ER 25 MG PO TB24
25.0000 mg | ORAL_TABLET | Freq: Two times a day (BID) | ORAL | Status: DC
  Administered 2022-06-04: 25 mg via ORAL
  Filled 2022-06-03: qty 1

## 2022-06-03 MED ORDER — ENOXAPARIN SODIUM 40 MG/0.4ML IJ SOSY
40.0000 mg | PREFILLED_SYRINGE | INTRAMUSCULAR | Status: DC
Start: 2022-06-04 — End: 2022-06-04

## 2022-06-03 MED ORDER — TAMSULOSIN HCL 0.4 MG PO CAPS
0.4000 mg | ORAL_CAPSULE | Freq: Every day | ORAL | Status: DC
  Administered 2022-06-04: 0.4 mg via ORAL
  Filled 2022-06-03: qty 1

## 2022-06-03 MED ORDER — ORAL CARE MOUTH RINSE
15.0000 mL | Freq: Once | OROMUCOSAL | Status: AC
Start: 2022-06-03 — End: 2022-06-03

## 2022-06-03 MED ORDER — POTASSIUM CHLORIDE ER 10 MEQ PO TBCR
10.0000 meq | EXTENDED_RELEASE_TABLET | Freq: Two times a day (BID) | ORAL | Status: DC
  Administered 2022-06-04: 10 meq via ORAL
  Filled 2022-06-03 (×3): qty 1

## 2022-06-03 MED ORDER — MIDAZOLAM HCL 2 MG/2ML IJ SOLN
INTRAMUSCULAR | Status: AC
  Filled 2022-06-03: qty 2

## 2022-06-03 MED ORDER — DEXMEDETOMIDINE (PRECEDEX) IN NS 20 MCG/5ML (4 MCG/ML) IV SYRINGE
PREFILLED_SYRINGE | INTRAVENOUS | Status: DC | PRN
  Administered 2022-06-03: 8 ug via INTRAVENOUS

## 2022-06-03 MED ORDER — LACTATED RINGERS IV SOLN: INTRAVENOUS | Status: DC | PRN

## 2022-06-03 MED ORDER — ENALAPRIL MALEATE 2.5 MG PO TABS
20.0000 mg | ORAL_TABLET | Freq: Every day | ORAL | Status: DC
  Administered 2022-06-04: 20 mg via ORAL
  Filled 2022-06-03: qty 1
  Filled 2022-06-03: qty 8

## 2022-06-03 MED ORDER — MIDAZOLAM HCL 2 MG/2ML IJ SOLN
INTRAMUSCULAR | Status: DC | PRN
  Administered 2022-06-03: 2 mg via INTRAVENOUS

## 2022-06-03 MED ORDER — SUGAMMADEX SODIUM 200 MG/2ML IV SOLN
INTRAVENOUS | Status: DC | PRN
  Administered 2022-06-03: 200 mg via INTRAVENOUS

## 2022-06-03 MED ORDER — ACETAMINOPHEN 325 MG PO TABS
650.0000 mg | ORAL_TABLET | Freq: Four times a day (QID) | ORAL | Status: DC | PRN
  Administered 2022-06-04: 650 mg via ORAL
  Filled 2022-06-03: qty 2

## 2022-06-03 MED ORDER — FENTANYL CITRATE (PF) 100 MCG/2ML IJ SOLN
25.0000 ug | INTRAMUSCULAR | Status: DC | PRN
  Administered 2022-06-03: 50 ug via INTRAVENOUS

## 2022-06-03 MED ORDER — AMISULPRIDE (ANTIEMETIC) 5 MG/2ML IV SOLN
10.0000 mg | Freq: Once | INTRAVENOUS | Status: AC | PRN
  Administered 2022-06-03: 10 mg via INTRAVENOUS

## 2022-06-03 MED ORDER — KETOROLAC TROMETHAMINE 15 MG/ML IJ SOLN
15.0000 mg | Freq: Four times a day (QID) | INTRAMUSCULAR | Status: DC | PRN
  Administered 2022-06-03: 15 mg via INTRAVENOUS
  Filled 2022-06-03: qty 1

## 2022-06-03 MED ORDER — HYDRALAZINE HCL 20 MG/ML IJ SOLN: 10.0000 mg | INTRAMUSCULAR | Status: DC | PRN

## 2022-06-03 MED ORDER — CEFAZOLIN SODIUM-DEXTROSE 2-4 GM/100ML-% IV SOLN
2.0000 g | Freq: Three times a day (TID) | INTRAVENOUS | Status: AC
  Administered 2022-06-03: 2 g via INTRAVENOUS
  Filled 2022-06-03: qty 100

## 2022-06-03 MED ORDER — ACETAMINOPHEN 500 MG PO TABS
1000.0000 mg | ORAL_TABLET | Freq: Once | ORAL | Status: AC
  Administered 2022-06-03: 1000 mg via ORAL
  Filled 2022-06-03: qty 2

## 2022-06-03 MED ORDER — FENTANYL CITRATE (PF) 250 MCG/5ML IJ SOLN
INTRAMUSCULAR | Status: AC
  Filled 2022-06-03: qty 5

## 2022-06-03 MED ORDER — CELECOXIB 200 MG PO CAPS
200.0000 mg | ORAL_CAPSULE | Freq: Once | ORAL | Status: AC
  Administered 2022-06-03: 200 mg via ORAL
  Filled 2022-06-03: qty 1

## 2022-06-03 MED ORDER — ACETAMINOPHEN 650 MG RE SUPP: 650.0000 mg | Freq: Four times a day (QID) | RECTAL | Status: DC | PRN

## 2022-06-03 MED ORDER — PHENYLEPHRINE 80 MCG/ML (10ML) SYRINGE FOR IV PUSH (FOR BLOOD PRESSURE SUPPORT)
PREFILLED_SYRINGE | INTRAVENOUS | Status: DC | PRN
  Administered 2022-06-03: 80 ug via INTRAVENOUS

## 2022-06-03 MED ORDER — FENTANYL CITRATE (PF) 100 MCG/2ML IJ SOLN
INTRAMUSCULAR | Status: AC
  Filled 2022-06-03: qty 2

## 2022-06-03 MED ORDER — PROPOFOL 10 MG/ML IV BOLUS
INTRAVENOUS | Status: AC
  Filled 2022-06-03: qty 20

## 2022-06-03 MED ORDER — DEXAMETHASONE SODIUM PHOSPHATE 10 MG/ML IJ SOLN
INTRAMUSCULAR | Status: DC | PRN
  Administered 2022-06-03: 10 mg via INTRAVENOUS

## 2022-06-03 MED ORDER — BUPIVACAINE HCL (PF) 0.5 % IJ SOLN
INTRAMUSCULAR | Status: AC
  Filled 2022-06-03: qty 30

## 2022-06-03 MED ORDER — PROPOFOL 10 MG/ML IV BOLUS
INTRAVENOUS | Status: DC | PRN
  Administered 2022-06-03: 170 mg via INTRAVENOUS

## 2022-06-03 MED ORDER — LACTATED RINGERS IV SOLN: INTRAVENOUS | Status: DC

## 2022-06-03 MED ORDER — PHENYLEPHRINE HCL-NACL 20-0.9 MG/250ML-% IV SOLN
INTRAVENOUS | Status: DC | PRN
  Administered 2022-06-03: 50 ug/min via INTRAVENOUS

## 2022-06-03 MED ORDER — BUPIVACAINE LIPOSOME 1.3 % IJ SUSP
INTRAMUSCULAR | Status: DC | PRN
  Administered 2022-06-03: 50 mL

## 2022-06-03 MED ORDER — PROMETHAZINE HCL 25 MG/ML IJ SOLN: 6.2500 mg | INTRAMUSCULAR | Status: DC | PRN

## 2022-06-03 MED ORDER — HYDROMORPHONE HCL 1 MG/ML IJ SOLN
INTRAMUSCULAR | Status: DC | PRN
  Administered 2022-06-03: .5 mg via INTRAVENOUS

## 2022-06-03 MED ORDER — ONDANSETRON HCL 4 MG/2ML IJ SOLN
INTRAMUSCULAR | Status: DC | PRN
  Administered 2022-06-03: 4 mg via INTRAVENOUS

## 2022-06-03 MED ORDER — MORPHINE SULFATE (PF) 2 MG/ML IV SOLN: 2.0000 mg | INTRAVENOUS | Status: DC | PRN

## 2022-06-03 MED ORDER — ROCURONIUM BROMIDE 10 MG/ML (PF) SYRINGE
PREFILLED_SYRINGE | INTRAVENOUS | Status: DC | PRN
  Administered 2022-06-03: 20 mg via INTRAVENOUS
  Administered 2022-06-03: 50 mg via INTRAVENOUS
  Administered 2022-06-03: 100 mg via INTRAVENOUS

## 2022-06-03 MED ORDER — ONDANSETRON 4 MG PO TBDP: 4.0000 mg | ORAL_TABLET | Freq: Four times a day (QID) | ORAL | Status: DC | PRN

## 2022-06-03 MED ORDER — HYDROMORPHONE HCL 1 MG/ML IJ SOLN
INTRAMUSCULAR | Status: AC
  Filled 2022-06-03: qty 0.5

## 2022-06-03 MED ORDER — CEFAZOLIN SODIUM-DEXTROSE 2-4 GM/100ML-% IV SOLN
2.0000 g | INTRAVENOUS | Status: AC
  Administered 2022-06-03: 2 g via INTRAVENOUS
  Filled 2022-06-03: qty 100

## 2022-06-03 MED ORDER — FENTANYL CITRATE (PF) 250 MCG/5ML IJ SOLN
INTRAMUSCULAR | Status: DC | PRN
  Administered 2022-06-03 (×5): 50 ug via INTRAVENOUS

## 2022-06-03 MED ORDER — BUPIVACAINE LIPOSOME 1.3 % IJ SUSP
INTRAMUSCULAR | Status: AC
  Filled 2022-06-03: qty 20

## 2022-06-03 MED ORDER — CHLORHEXIDINE GLUCONATE 0.12 % MT SOLN
15.0000 mL | Freq: Once | OROMUCOSAL | Status: AC
Start: 2022-06-03 — End: 2022-06-03
  Administered 2022-06-03: 15 mL via OROMUCOSAL
  Filled 2022-06-03: qty 15

## 2022-06-03 MED ORDER — 0.9 % SODIUM CHLORIDE (POUR BTL) OPTIME
TOPICAL | Status: DC | PRN
  Administered 2022-06-03: 2000 mL

## 2022-06-03 SURGICAL SUPPLY — 73 items
ADH SKN CLS APL DERMABOND .7 (GAUZE/BANDAGES/DRESSINGS) ×2
BLADE CLIPPER SURG (BLADE) ×4 IMPLANT
BLADE SURG 11 STRL SS (BLADE) ×4 IMPLANT
BUTTON OLYMPUS DEFENDO 5 PIECE (MISCELLANEOUS) ×4 IMPLANT
CANISTER SUCT 3000ML PPV (MISCELLANEOUS) ×8 IMPLANT
CNTNR URN SCR LID CUP LEK RST (MISCELLANEOUS) ×3 IMPLANT
CONT SPEC 4OZ STRL OR WHT (MISCELLANEOUS) ×6
COVER BACK TABLE 60X90IN (DRAPES) ×1 IMPLANT
DEFOGGER SCOPE WARMER CLEARIFY (MISCELLANEOUS) ×4 IMPLANT
DERMABOND ADVANCED (GAUZE/BANDAGES/DRESSINGS) ×1
DERMABOND ADVANCED .7 DNX12 (GAUZE/BANDAGES/DRESSINGS) ×3 IMPLANT
DEVICE SUTURE ENDOST 10MM (ENDOMECHANICALS) ×1 IMPLANT
DRAIN PENROSE 1/4X12 LTX STRL (WOUND CARE) IMPLANT
DRAPE ARM DVNC X/XI (DISPOSABLE) ×12 IMPLANT
DRAPE COLUMN DVNC XI (DISPOSABLE) ×3 IMPLANT
DRAPE CV SPLIT W-CLR ANES SCRN (DRAPES) ×4 IMPLANT
DRAPE DA VINCI XI ARM (DISPOSABLE) ×12
DRAPE DA VINCI XI COLUMN (DISPOSABLE) ×3
DRAPE INCISE IOBAN 66X45 STRL (DRAPES) IMPLANT
DRAPE ORTHO SPLIT 77X108 STRL (DRAPES) ×3
DRAPE SURG ORHT 6 SPLT 77X108 (DRAPES) ×3 IMPLANT
ELECT REM PT RETURN 9FT ADLT (ELECTROSURGICAL) ×3
ELECTRODE REM PT RTRN 9FT ADLT (ELECTROSURGICAL) ×3 IMPLANT
FORCEPS GRASP COMBO 8X230 (FORCEP) ×4 IMPLANT
GAUZE 4X4 16PLY ~~LOC~~+RFID DBL (SPONGE) ×4 IMPLANT
GAUZE SPONGE 4X4 12PLY STRL (GAUZE/BANDAGES/DRESSINGS) ×4 IMPLANT
GLOVE BIO SURGEON STRL SZ7 (GLOVE) ×4 IMPLANT
GLOVE BIO SURGEON STRL SZ7.5 (GLOVE) ×4 IMPLANT
GOWN STRL REUS W/ TWL LRG LVL3 (GOWN DISPOSABLE) ×3 IMPLANT
GOWN STRL REUS W/ TWL XL LVL3 (GOWN DISPOSABLE) ×6 IMPLANT
GOWN STRL REUS W/TWL 2XL LVL3 (GOWN DISPOSABLE) ×4 IMPLANT
GOWN STRL REUS W/TWL LRG LVL3 (GOWN DISPOSABLE) ×3
GOWN STRL REUS W/TWL XL LVL3 (GOWN DISPOSABLE) ×6
GRAFT MYRIAD 5 LAYER 5X5 (Graft) ×1 IMPLANT
GRASPER SUT TROCAR 14GX15 (MISCELLANEOUS) ×1 IMPLANT
HEMOSTAT SURGICEL 2X14 (HEMOSTASIS) ×1 IMPLANT
IV NS 1000ML (IV SOLUTION)
IV NS 1000ML BAXH (IV SOLUTION) IMPLANT
KIT BASIN OR (CUSTOM PROCEDURE TRAY) ×4 IMPLANT
KIT TURNOVER KIT B (KITS) ×4 IMPLANT
MARKER SKIN DUAL TIP RULER LAB (MISCELLANEOUS) ×4 IMPLANT
NS IRRIG 1000ML POUR BTL (IV SOLUTION) ×8 IMPLANT
OIL SILICONE PENTAX (PARTS (SERVICE/REPAIRS)) IMPLANT
PACK CHEST (CUSTOM PROCEDURE TRAY) ×4 IMPLANT
PAD ARMBOARD 7.5X6 YLW CONV (MISCELLANEOUS) ×8 IMPLANT
PORT ACCESS TROCAR AIRSEAL 12 (TROCAR) IMPLANT
PORT ACCESS TROCAR AIRSEAL 5M (TROCAR) ×1
SEAL CANN UNIV 5-8 DVNC XI (MISCELLANEOUS) ×12 IMPLANT
SEAL XI 5MM-8MM UNIVERSAL (MISCELLANEOUS) ×15
SEALER SYNCHRO 8 IS4000 DV (MISCELLANEOUS) ×3
SEALER SYNCHRO 8 IS4000 DVNC (MISCELLANEOUS) IMPLANT
SET TRI-LUMEN FLTR TB AIRSEAL (TUBING) ×1 IMPLANT
SOL ANTI FOG 6CC (MISCELLANEOUS) IMPLANT
SOLUTION ANTI FOG 6CC (MISCELLANEOUS) ×1
SOLUTION ELECTROLUBE (MISCELLANEOUS) ×4 IMPLANT
SPONGE T-LAP 18X18 ~~LOC~~+RFID (SPONGE) ×13 IMPLANT
SUT ETHIBOND 0 36 GRN (SUTURE) ×8 IMPLANT
SUT SURGIDAC NAB ES-9 0 48 120 (SUTURE) ×3 IMPLANT
SUT VIC AB 3-0 X1 27 (SUTURE) ×2 IMPLANT
SUT VICRYL 0 UR6 27IN ABS (SUTURE) ×8 IMPLANT
SYR 10ML LL (SYRINGE) IMPLANT
SYR 20ML ECCENTRIC (SYRINGE) ×4 IMPLANT
SYR 30ML SLIP (SYRINGE) ×4 IMPLANT
SYR 50ML LL SCALE MARK (SYRINGE) ×1 IMPLANT
TOWEL GREEN STERILE (TOWEL DISPOSABLE) ×4 IMPLANT
TOWEL GREEN STERILE FF (TOWEL DISPOSABLE) ×4 IMPLANT
TRAY FOLEY MTR SLVR 16FR STAT (SET/KITS/TRAYS/PACK) ×4 IMPLANT
TROCAR PORT AIRSEAL 8X120 (TROCAR) ×1 IMPLANT
TROCAR XCEL BLADELESS 5X75MML (TROCAR) ×4 IMPLANT
TUBE CONNECTING 20X1/4 (TUBING) ×4 IMPLANT
TUBING ENDO SMARTCAP (MISCELLANEOUS) ×4 IMPLANT
UNDERPAD 30X36 HEAVY ABSORB (UNDERPADS AND DIAPERS) ×4 IMPLANT
WATER STERILE IRR 1000ML POUR (IV SOLUTION) ×4 IMPLANT

## 2022-06-03 NOTE — Anesthesia Procedure Notes (Signed)
Procedure Name: Intubation Date/Time: 06/03/2022 7:45 AM  Performed by: Lavell Luster, CRNAPre-anesthesia Checklist: Patient identified, Emergency Drugs available, Suction available, Patient being monitored and Timeout performed Patient Re-evaluated:Patient Re-evaluated prior to induction Oxygen Delivery Method: Circle system utilized Preoxygenation: Pre-oxygenation with 100% oxygen Induction Type: IV induction Ventilation: Mask ventilation without difficulty Laryngoscope Size: Mac and 4 Grade View: Grade I Tube type: Oral Tube size: 7.5 mm Number of attempts: 1 Airway Equipment and Method: Stylet Placement Confirmation: ETT inserted through vocal cords under direct vision, positive ETCO2 and breath sounds checked- equal and bilateral Secured at: 21 cm Tube secured with: Tape Dental Injury: Teeth and Oropharynx as per pre-operative assessment

## 2022-06-03 NOTE — Hospital Course (Addendum)
Referring: Vickie Epley, MD Primary Care: Gennette Pac, NP Primary Cardiologist: Jenkins Rouge, MD         History of Present Illness:    Tanner Rich 66 y.o. male presents to discuss recurrence of his hiatal hernia.  He originally underwent a paraesophageal hernia repair in March 2022 in preparation for an A-fib ablation procedure.  After his surgery and the ablation he has done well.  He states Sunday he experienced an acute onset chest pain and some nausea and vomiting.  He presented to the emergency department where chest x-ray revealed a recurrence of his hiatal hernia.  It was Dr. Abran Duke opinion that he should undergo redo repair and he was admitted electively for the procedure.  Hospital course: Patient was admitted electively on 06/03/2022 at which time he was taken the operating room and underwent a robotic assisted laparoscopic repair of paraesophageal hernia.  He tolerated procedure well was taken to the postanesthesia care unit in stable condition.  Postoperative hospital course:  Patient has remained quite stable.  On postop day 1 he underwent a Gastrografin esophagram which showed the esophagus to be patent with dye going into the stomach and no evidence of leak.  The full report is currently pending.  Clinically he has had no significant difficulties and only minor pain.  He did eat without nausea, vomiting or any symptoms of reflux.  He has a very minor expected acute blood loss anemia.  Any function has remained within normal limit.  He is hemodynamically stable in sinus rhythm with occasional PACs.  His Xarelto is being resumed.  Incisions are noted to be healing well without evidence of infection.  Oxygen has been weaned and he maintains good saturations on room air.  At time of discharge the patient is felt to be quite stable

## 2022-06-03 NOTE — Interval H&P Note (Signed)
History and Physical Interval Note:  06/03/2022 7:10 AM  Tanner Rich  has presented today for surgery, with the diagnosis of HIATAL HERNIA.  The various methods of treatment have been discussed with the patient and family. After consideration of risks, benefits and other options for treatment, the patient has consented to  Procedure(s): XI ROBOTIC ASSISTED HIATAL HERNIA REPAIR (N/A) ESOPHAGOGASTRODUODENOSCOPY (EGD) (N/A) as a surgical intervention.  The patient's history has been reviewed, patient examined, no change in status, stable for surgery.  I have reviewed the patient's chart and labs.  Questions were answered to the patient's satisfaction.     [name]

## 2022-06-03 NOTE — Brief Op Note (Signed)
06/03/2022  10:35 AM  PATIENT:  Tanner Rich  66 y.o. male  PRE-OPERATIVE DIAGNOSIS:  HIATAL HERNIA  POST-OPERATIVE DIAGNOSIS:  HIATAL HERNIA  PROCEDURE:  Procedure(s): XI ROBOTIC ASSISTED HIATAL HERNIA REPAIR (N/A) ESOPHAGOGASTRODUODENOSCOPY (EGD) (N/A)  SURGEON:  Surgeon(s) and Role:    * Lightfoot, Lucile Crater, MD - Primary  PHYSICIAN ASSISTANT: Shantanique Hodo PA-C  ANESTHESIA:   general  EBL:  50 CC  BLOOD ADMINISTERED:none  DRAINS: none   LOCAL MEDICATIONS USED:  BUPIVICAINE  and OTHER EXPAREL  SPECIMEN:  No Specimen  DISPOSITION OF SPECIMEN:  N/A  COUNTS:  YES  TOURNIQUET:  * No tourniquets in log *  DICTATION: .Dragon Dictation  PLAN OF CARE: Admit to inpatient   PATIENT DISPOSITION:  PACU - hemodynamically stable.   Delay start of Pharmacological VTE agent (>24hrs) due to surgical blood loss or risk of bleeding: no  COMPLICATIONS: NO KNOWN

## 2022-06-03 NOTE — Op Note (Signed)
[address]       [place] [address]             [phone]        06/03/2022  Patient:  Hunt Oris Tlatelpa Pre-Op Dx: Recurrent paraesophageal hernia   Post-op Dx:  same Procedure: - Esophagoscopy - Robotic assisted laparoscopy - Redo Paraesophageal hernia repair 5 layer Myriad mesh pledgets - Gastropexy   Surgeon and Role:      * Athene Schuhmacher, Lucile Crater, MD - Primary  Assistant: Evonnie Pat, PA-C  An experienced assistant was required given the complexity of this surgery and the standard of surgical care. The assistant was needed for exposure, dissection, suctioning, retraction of delicate tissues and sutures, instrument exchange and for overall help during this procedure.   Anesthesia  general EBL:  29m Blood Administration: none Specimen:  none   Counts: correct   Indications: 66year old male with a recurrent paraesophageal hernia.  This originally was repaired with ACell mesh.  He also underwent a toupet fundoplication.  He is agreeable to proceed with a redo hernia repair.  This will be performed in conjunction with an EGD.  He will hold his Xarelto for 2 days.  Findings: Tortuous esophagus.  Difficult to intubate the stomach.  Stomach was in there hernia sac.   Operative Technique: After the risks, benefits and alternatives were thoroughly discussed, the patient was brought to the operative theatre.  Anesthesia was induced, and the esophagoscope was passed through the oropharynx down to the stomach.  The scope was retroflexed and the hiatal hernia was clearly evident.  The scope was pulled back and the mucosal surface of the esophagus was visualized.    It was difficult to intubate the stomach.  The scope was then parked at 25 cm from the incisors.  The patient was then prepped and draped in normal sterile fashion.  An appropriate surgical pause was performed, and pre-operative antibiotics were dosed accordingly.  We began with a 1 cm incision 15 cm  caudad from the xiphoid and slightly lateral to the umbilicus.  Using an Optiview we entered the peritoneal space.  The abdomen was then insufflated with CO2.  3 other robotic ports were placed to triangulate the hiatus.  Another 12 mm port was placed in place at the level of the umbilicus laterally for an assistant port and another 5 mm trocar was placed in the right lower quadrant for liver retractor.  The patient was then placed in steep reverse Trendelenburg and the liver was elevated to expose the esophageal hiatus.  And then the robot was docked.  We began by mobilizing the stomach out of the hiatus.  The hiatus was then inspected, and the hernia defect was posterior.  The hiatus was mobilized and freed of all scar tissue.  We continued our dissection up into the mediastinum.  Once we had achieved 3 to 4 cm of intra-abdominal esophagus we then proceeded to reapproximate the crura with 0 Ethibond sutures in an interrupted fashion.  Myriad mesh was used to buttress the repair.  The gastroscope was passed down through the lower esophageal sphincter into the stomach and would act as our bougie during this repair.  An air leak test was performed using the gastroscope.  No leak was evident.  The liver retractor was removed and the stomach was then pexied to the abdominal wall with transfascial  Ethibond sutures.  All ports were removed under direct visualization.  The skin and soft  tissue were closed with absorbable suture    The patient tolerated the procedure without any immediate complications, and was transferred to the PACU in stable condition.  Dominic Rhome Bary Leriche

## 2022-06-03 NOTE — Anesthesia Postprocedure Evaluation (Signed)
Anesthesia Post Note  Patient: Tanner Rich  Procedure(s) Performed: XI ROBOTIC ASSISTED HIATAL HERNIA REPAIR (Chest) ESOPHAGOGASTRODUODENOSCOPY (EGD)     Patient location during evaluation: PACU Anesthesia Type: General Level of consciousness: sedated Pain management: pain level controlled Vital Signs Assessment: post-procedure vital signs reviewed and stable Respiratory status: spontaneous breathing and respiratory function stable Cardiovascular status: stable Postop Assessment: no apparent nausea or vomiting Anesthetic complications: no   No notable events documented.  Last Vitals:  Vitals:   06/03/22 1145 06/03/22 1200  BP: 136/87 138/85  Pulse: 61 (!) 58  Resp: 14 11  Temp:  36.4 C  SpO2: 99% 95%    Last Pain:  Vitals:   06/03/22 1200  TempSrc:   PainSc: Asleep                 Nicolle Heward,Bauer DANIEL

## 2022-06-03 NOTE — Care Management Obs Status (Signed)
MEDICARE OBSERVATION STATUS NOTIFICATION   Patient Details  Name: ISTVAN BEHAR MRN: [id number] Date of Birth: 1956/05/01   Medicare Observation Status Notification Given:  Yes    Lockie Pares, RN 06/03/2022, 5:42 PM

## 2022-06-03 NOTE — Care Management CC44 (Signed)
Condition Code 44 Documentation Completed  Patient Details  Name: Tanner Rich MRN: [id number] Date of Birth: 04/03/1956   Condition Code 44 given:  Yes Patient signature on Condition Code 44 notice:  Yes Documentation of 2 MD's agreement:  Yes Code 44 added to claim:  Yes    Lockie Pares, RN 06/03/2022, 5:41 PM

## 2022-06-03 NOTE — Transfer of Care (Signed)
Immediate Anesthesia Transfer of Care Note  Patient: Tanner Rich  Procedure(s) Performed: XI ROBOTIC ASSISTED HIATAL HERNIA REPAIR (Chest) ESOPHAGOGASTRODUODENOSCOPY (EGD)  Patient Location: PACU  Anesthesia Type:General  Level of Consciousness: awake, alert  and oriented  Airway & Oxygen Therapy: Patient connected to face mask oxygen  Post-op Assessment: Post -op Vital signs reviewed and stable  Post vital signs: stable  Last Vitals:  Vitals Value Taken Time  BP 147/96 06/03/22 1100  Temp    Pulse 61 06/03/22 1102  Resp 18 06/03/22 1102  SpO2 98 % 06/03/22 1102  Vitals shown include unvalidated device data.  Last Pain:  Vitals:   06/03/22 0621  TempSrc:   PainSc: 0-No pain         Complications: No notable events documented.

## 2022-06-03 NOTE — Discharge Summary (Cosign Needed)
Physician Discharge Summary       [address]       [place] [address]             [phone]    Patient ID: Tanner Rich MRN: [id number] DOB/AGE: Jul 03, 1956 66 y.o.  Admit date: 06/03/2022 Discharge date: 06/04/2022  Admission Diagnoses: Paraesophageal hernia  Discharge Diagnoses:  Principal Problem:   Hiatal hernia Active Problems:   S/P repair of paraesophageal hernia   Patient Active Problem List   Diagnosis Date Noted   Hiatal hernia 06/03/2022   Persistent atrial fibrillation ([facility]) 05/19/2021   Secondary hypercoagulable state ([place]) 05/19/2021   S/P repair of paraesophageal hernia 04/23/2021   Prostatitis 03/27/2021   COVID-19 12/17/2020   HLD (hyperlipidemia) 08/04/2019   Family history of malignant neoplasm of prostate 06/22/2019   Hemorrhoids 06/22/2019   Cerebrovascular disease 10/12/2018   Gouty arthritis 10/12/2018   Pre-diabetes 10/12/2018   Paroxysmal atrial fibrillation (HCC)    OSA (obstructive sleep apnea) 05/07/2015   Barrett's esophagus without dysplasia 11/27/2014   Essential hypertension 09/26/2014   Benign prostate hyperplasia 12/04/2013   Lower urinary tract symptoms 12/04/2013   Urinary tract obstruction 12/04/2013   Blood in urine 11/14/2013     Consults: None  Procedure (s): surgery   06/03/2022   Patient:  Tanner Rich Pre-Op Dx: Recurrent paraesophageal hernia   Post-op Dx:  same Procedure: - Esophagoscopy - Robotic assisted laparoscopy - Redo Paraesophageal hernia repair 5 layer Myriad mesh pledgets - Gastropexy     Surgeon and Role:      * Lajuana Matte, MD - Primary   Assistant: Evonnie Pat, PA-C    Referring: Vickie Epley, MD Primary Care: Gennette Pac, NP Primary Cardiologist: Jenkins Rouge, MD         History of Present Illness:    Tanner Rich 66 y.o. male presents to discuss recurrence of his hiatal hernia.  He originally underwent a paraesophageal hernia repair in March 2022  in preparation for an A-fib ablation procedure.  After his surgery and the ablation he has done well.  He states Sunday he experienced an acute onset chest pain and some nausea and vomiting.  He presented to the emergency department where chest x-ray revealed a recurrence of his hiatal hernia.  It was Dr. Abran Duke opinion that he should undergo redo repair and he was admitted electively for the procedure.  Hospital course: Patient was admitted electively on 06/03/2022 at which time he was taken the operating room and underwent a robotic assisted laparoscopic repair of paraesophageal hernia.  He tolerated procedure well was taken to the postanesthesia care unit in stable condition.  Postoperative hospital course:  Patient has remained quite stable.  On postop day 1 he underwent a Gastrografin esophagram which showed the esophagus to be patent with dye going into the stomach and no evidence of leak.  The full report is currently pending.  Clinically he has had no significant difficulties and only minor pain.  He did eat without nausea, vomiting or any symptoms of reflux.  He has a very minor expected acute blood loss anemia.  Any function has remained within normal limit.  He is hemodynamically stable in sinus rhythm with occasional PACs.  His Xarelto is being resumed.  Incisions are noted to be healing well without evidence of infection.  Oxygen has been weaned and he maintains good saturations on room air.  At time of discharge the patient is felt to be  quite stable    Latest Vital Signs: Blood pressure 135/78, pulse 65, temperature 98.2 F (36.8 C), temperature source Oral, resp. rate 18, height _0  (1.676 m), weight 73.9 kg, SpO2 99 %.  Physical Exam: General appearance: alert, cooperative, and no distress Heart: regular rate and rhythm Lungs: clear to auscultation bilaterally Abdomen: non tender or distended Wound: incis without signs of infection   Discharge Condition:good  Recent  laboratory studies:  Lab Results  Component Value Date   WBC 9.3 06/04/2022   HGB 12.1 (L) 06/04/2022   HCT 38.1 (L) 06/04/2022   MCV 90.7 06/04/2022   PLT 163 06/04/2022   Lab Results  Component Value Date   NA 137 06/04/2022   K 4.5 06/04/2022   CL 107 06/04/2022   CO2 26 06/04/2022   CREATININE 0.90 06/04/2022   GLUCOSE 113 (H) 06/04/2022      Diagnostic Studies: DG Chest 2 View  Result Date: 06/03/2022 CLINICAL DATA:  Preoperative film EXAM: CHEST - 2 VIEW COMPARISON:  CT chest June 2023 FINDINGS: Large hiatal hernia. The heart is normal in size. No pleural effusion. No pneumothorax. No mass or focal consolidation. No acute osseous abnormality. IMPRESSION: Large hiatal hernia as seen on recent CT chest. No new acute findings in the chest. Electronically Signed   By: Albin Felling M.D.   On: 06/03/2022 13:41   CT CHEST WO CONTRAST  Result Date: 05/31/2022 CLINICAL DATA:  Status post repair of paraesophageal hernia. EXAM: CT CHEST WITHOUT CONTRAST TECHNIQUE: Multidetector CT imaging of the chest was performed following the standard protocol without IV contrast. RADIATION DOSE REDUCTION: This exam was performed according to the departmental dose-optimization program which includes automated exposure control, adjustment of the mA and/or kV according to patient size and/or use of iterative reconstruction technique. COMPARISON:  Cardiac CT/CTA 03/17/2021 FINDINGS: Cardiovascular: Mild cardiac enlargement. No pericardial effusion. Aortic atherosclerosis. Coronary artery calcifications. Mediastinum/Nodes: No enlarged mediastinal or axillary lymph nodes. Thyroid gland, trachea, and esophagus demonstrate no significant findings. Lungs/Pleura: No pleural effusion, airspace consolidation, atelectasis or pneumothorax. Subpleural right middle lobe nodule abutting the major fissure measures 5 mm. Unchanged compared with 03/17/2021. Nodule within the right base measures 3 mm and is also unchanged from  the previous exam, image 117/8. There are no new or suspicious lung nodules. Upper Abdomen: No acute abnormality. Stable low-density structure in the posterior right lobe of liver measuring 8 mm. There has been interval recurrence the patient's hiatal hernia which measures approximately 10.6 x 5.2 by 8.1 cm, image 106/2. Musculoskeletal: No chest wall mass or suspicious bone lesions identified. IMPRESSION: 1. Interval recurrence of large hiatal hernia. 2. Small right lung nodules are unchanged from 03/17/2021 and are compatible with a benign abnormality.No routine follow-up imaging is recommended per Fleischner Society Guidelines. These guidelines do not apply to immunocompromised patients and patients with cancer. Follow up in patients with significant comorbidities as clinically warranted. For lung cancer screening, adhere to Lung-RADS guidelines. Reference: Radiology. 2017; 284(1):228-43. 3. Coronary artery calcifications. 4. Aortic Atherosclerosis (ICD10-I70.0). Electronically Signed   By: Kerby Moors M.D.   On: 05/31/2022 12:45       Discharge Instructions     Discharge patient   Complete by: As directed    Discharge disposition: 01-Home or Self Care   Discharge patient date: 06/04/2022       Discharge Medications: Allergies as of 06/04/2022       Reactions   Ciprofloxacin Swelling, Other (See Comments)   Pt states he took med  for 3 days and ankle swelling started.        Medication List     TAKE these medications    acetaminophen 500 MG tablet Commonly known as: TYLENOL Take 2 tablets (1,000 mg total) by mouth every 6 (six) hours. What changed:  when to take this reasons to take this   dofetilide 250 MCG capsule Commonly known as: TIKOSYN Take 1 capsule (250 mcg total) by mouth 2 (two) times daily.   enalapril 20 MG tablet Commonly known as: VASOTEC Take 20 mg by mouth daily.   metoprolol succinate 25 MG 24 hr tablet Commonly known as: Toprol XL Take 1 tablet (25  mg total) by mouth 2 (two) times daily.   potassium chloride 10 MEQ tablet Commonly known as: KLOR-CON Take 1 tablet (10 mEq total) by mouth 2 (two) times daily.   rivaroxaban 20 MG Tabs tablet Commonly known as: XARELTO Take 1 tablet (20 mg total) by mouth daily.   tamsulosin 0.4 MG Caps capsule Commonly known as: FLOMAX Take 0.4 mg by mouth daily.   traMADol 50 MG tablet Commonly known as: Ultram Take 1 tablet (50 mg total) by mouth every 12 (twelve) hours as needed for up to 15 days.   Vitamin D (Ergocalciferol) 1.25 MG (50000 UNIT) Caps capsule Commonly known as: DRISDOL Take 50,000 Units by mouth every 30 (thirty) days.        Follow Up Appointments:  Follow-up Information     Lightfoot, Lucile Crater, MD Follow up.   Specialty: Cardiothoracic Surgery Why: Please see discharge paperwork for follow-up appointment.  You have a virtual visit with Dr. Kipp Brood in 2 weeks where he will call you on the phone. Contact information: [place] [address] [phone]                 Signed: Gaspar Bidding 06/04/2022, 1:23 PM

## 2022-06-04 ENCOUNTER — Observation Stay (HOSPITAL_COMMUNITY): Payer: Medicare PPO

## 2022-06-04 ENCOUNTER — Encounter (HOSPITAL_COMMUNITY): Payer: Self-pay | Admitting: Thoracic Surgery (Cardiothoracic Vascular Surgery)

## 2022-06-04 DIAGNOSIS — K449 Diaphragmatic hernia without obstruction or gangrene: Secondary | ICD-10-CM | POA: Diagnosis not present

## 2022-06-04 LAB — BASIC METABOLIC PANEL
Anion gap: 4 — ABNORMAL LOW (ref 5–15)
BUN: 15 mg/dL (ref 8–23)
CO2: 26 mmol/L (ref 22–32)
Calcium: 9.2 mg/dL (ref 8.9–10.3)
Chloride: 107 mmol/L (ref 98–111)
Creatinine, Ser: 0.9 mg/dL (ref 0.61–1.24)
GFR, Estimated: >60 mL/min (ref >60)
Glucose, Bld: 113 mg/dL — ABNORMAL HIGH (ref 70–99)
Potassium: 4.5 mmol/L (ref 3.5–5.1)
Sodium: 137 mmol/L (ref 135–145)

## 2022-06-04 LAB — CBC
HCT: 38.1 % — ABNORMAL LOW (ref 39.0–52.0)
Hemoglobin: 12.1 g/dL — ABNORMAL LOW (ref 13.0–17.0)
MCH: 28.8 pg (ref 26.0–34.0)
MCHC: 31.8 g/dL (ref 30.0–36.0)
MCV: 90.7 fL (ref 80.0–100.0)
Platelets: 163 10*3/uL (ref 150–400)
RBC: 4.2 MIL/uL — ABNORMAL LOW (ref 4.22–5.81)
RDW: 12.3 % (ref 11.5–15.5)
WBC: 9.3 10*3/uL (ref 4.0–10.5)
nRBC: 0 % (ref 0.0–0.2)

## 2022-06-04 MED ORDER — POTASSIUM CHLORIDE ER 10 MEQ PO TBCR: 10.0000 meq | EXTENDED_RELEASE_TABLET | Freq: Two times a day (BID) | ORAL | Status: DC

## 2022-06-04 MED ORDER — RIVAROXABAN 20 MG PO TABS: 20.0000 mg | ORAL_TABLET | Freq: Every day | ORAL | Status: DC

## 2022-06-04 MED ORDER — TRAMADOL HCL 50 MG PO TABS: 50.0000 mg | ORAL_TABLET | Freq: Two times a day (BID) | ORAL | 0 refills | Status: AC | PRN

## 2022-06-04 MED ORDER — ENOXAPARIN SODIUM 40 MG/0.4ML IJ SOSY
40.0000 mg | PREFILLED_SYRINGE | INTRAMUSCULAR | Status: AC
  Administered 2022-06-04: 40 mg via SUBCUTANEOUS
  Filled 2022-06-04: qty 0.4

## 2022-06-04 MED ORDER — IOHEXOL 300 MG/ML  SOLN
100.0000 mL | Freq: Once | INTRAMUSCULAR | Status: AC | PRN
  Administered 2022-06-04: 120 mL via ORAL

## 2022-06-04 NOTE — Progress Notes (Signed)
Mobility Specialist Progress Note    06/04/22 1022  Mobility  Activity Ambulated independently in hallway  Level of Assistance Independent  Assistive Device None  Distance Ambulated (ft) 420 ft  Activity Response Tolerated well  $Mobility charge 1 Mobility   Pre-Mobility: 62 HR Post-Mobility: 58 HR  Pt received in chair and agreeable. No complaints on walk. Returned to chair with call bell in reach.    [place] Mobility Specialist

## 2022-06-04 NOTE — Progress Notes (Addendum)
      [address]       [place] [address]             [phone]      1 Day Post-Op Procedure(s) (LRB): XI ROBOTIC ASSISTED HIATAL HERNIA REPAIR (N/A) ESOPHAGOGASTRODUODENOSCOPY (EGD) (N/A) Subjective: Some discomfort but pretty mild  Objective: Vital signs in last 24 hours: Temp:  [97.2 F (36.2 C)-98.3 F (36.8 C)] 98.3 F (36.8 C) (06/09 0357) Pulse Rate:  [57-76] 63 (06/08 2332) Cardiac Rhythm: Normal sinus rhythm (06/08 2330) Resp:  [10-20] 14 (06/09 0357) BP: (126-165)/(76-99) 126/76 (06/09 0357) SpO2:  [90 %-99 %] 98 % (06/08 2332)  Hemodynamic parameters for last 24 hours:    Intake/Output from previous day: 06/08 0701 - 06/09 0700 In: 1850 [I.V.:1400; IV Piggyback:450] Out: 175 [Urine:175] Intake/Output this shift: No intake/output data recorded.  General appearance: alert, cooperative, and no distress Heart: regular rate and rhythm Lungs: clear to auscultation bilaterally Abdomen: non tender or distended Wound: incis without signs of infection  Lab Results: Recent Labs    06/02/22 1338 06/04/22 0209  WBC 6.1 9.3  HGB 14.7 12.1*  HCT 45.7 38.1*  PLT 225 163   BMET:  Recent Labs    06/02/22 1338 06/04/22 0209  NA 140 137  K 4.4 4.5  CL 111 107  CO2 21* 26  GLUCOSE 128* 113*  BUN 17 15  CREATININE 0.98 0.90  CALCIUM 10.0 9.2    PT/INR:  Recent Labs    06/02/22 1338  LABPROT 14.3  INR 1.1   ABG    Component Value Date/Time   PHART 7.5 (H) 06/02/2022 1330   HCO3 25.7 06/02/2022 1330   O2SAT 99.3 06/02/2022 1330   CBG (last 3)  No results for input(s): "GLUCAP" in the last 72 hours.  Meds Scheduled Meds:  dofetilide  250 mcg Oral BID   enalapril  20 mg Oral Daily   enoxaparin (LOVENOX) injection  40 mg Subcutaneous Q24H   metoprolol succinate  25 mg Oral BID   pantoprazole (PROTONIX) IV  40 mg Intravenous QHS   potassium chloride  10 mEq Oral BID   rivaroxaban  20 mg Oral Daily   tamsulosin  0.4 mg Oral  Daily   Continuous Infusions: PRN Meds:.acetaminophen **OR** acetaminophen, hydrALAZINE, [COMPLETED] ketorolac **FOLLOWED BY** ketorolac, morphine injection, ondansetron **OR** ondansetron (ZOFRAN) IV  Xrays DG Chest 2 View  Result Date: 06/03/2022 CLINICAL DATA:  Preoperative film EXAM: CHEST - 2 VIEW COMPARISON:  CT chest June 2023 FINDINGS: Large hiatal hernia. The heart is normal in size. No pleural effusion. No pneumothorax. No mass or focal consolidation. No acute osseous abnormality. IMPRESSION: Large hiatal hernia as seen on recent CT chest. No new acute findings in the chest. Electronically Signed   By: Albin Felling M.D.   On: 06/03/2022 13:41    Assessment/Plan: S/P Procedure(s) (LRB): XI ROBOTIC ASSISTED HIATAL HERNIA REPAIR (N/A) ESOPHAGOGASTRODUODENOSCOPY (EGD) (N/A) POD#1  1 afeb, S BP 120's-160's, sinus PAC's 2 sats good on RA 3 normal renal fxn 4 minor expected ABLA 5 ate this am without problem, swallow study is pending-poss home later today if no issues    LOS: 1 day    John Giovanni PA-C Pager [phone]  06/04/2022    Agree with above. No evidence of leak on esophagram. Advancing diet. Home tomorrow. Xarelto tonight.  Lucresha Dismuke Bary Leriche

## 2022-06-18 ENCOUNTER — Ambulatory Visit: Payer: Medicare PPO | Admitting: Thoracic Surgery (Cardiothoracic Vascular Surgery)

## 2022-06-18 ENCOUNTER — Ambulatory Visit (INDEPENDENT_AMBULATORY_CARE_PROVIDER_SITE_OTHER): Payer: Self-pay | Admitting: Thoracic Surgery (Cardiothoracic Vascular Surgery)

## 2022-06-18 DIAGNOSIS — Z8719 Personal history of other diseases of the digestive system: Secondary | ICD-10-CM

## 2022-06-18 DIAGNOSIS — Z9889 Other specified postprocedural states: Secondary | ICD-10-CM

## 2022-07-21 ENCOUNTER — Other Ambulatory Visit: Payer: Self-pay | Admitting: Thoracic Surgery (Cardiothoracic Vascular Surgery)

## 2022-07-21 DIAGNOSIS — K449 Diaphragmatic hernia without obstruction or gangrene: Secondary | ICD-10-CM

## 2022-07-22 NOTE — Progress Notes (Signed)
      [address]       [place] [address]             [phone]        Tanner Rich [place] Medical Record #[id number] Date of Birth: 1956-01-08  Referring: Lanier Prude, MD Primary Care: Marcell Anger, NP Primary Cardiologist:Peter Eden Emms, MD  Reason for visit:   follow-up  History of Present Illness:     66yo male presents for his 1 month follow-up after undergoing a redo-hiatal hernia repair.  He has no complaints.  He is tolerating a regular diet.  He denies any reflux or dysphagia.  Physical Exam: BP 127/80   Pulse (!) 57   Resp 20   Ht 5\' 6"  (1.676 m)   Wt 165 lb (74.8 kg)   SpO2 97%   BMI 26.63 kg/m   Alert NAD Incision clean.  Abdomen, ND no peripheral edema   Diagnostic Studies & Laboratory data: CXR: Clear     Assessment / Plan:   66yo male s/p redo hiatal hernia repair.  I will follow-up with him virtually in 3 months.   66yo 07/23/2022 9:39 PM

## 2022-07-23 ENCOUNTER — Ambulatory Visit
Admission: RE | Admit: 2022-07-23 | Discharge: 2022-07-23 | Disposition: A | Discharge status: Home / Self Care | Payer: Medicare PPO | Source: Ambulatory Visit | Attending: Thoracic Surgery (Cardiothoracic Vascular Surgery) | Admitting: Thoracic Surgery (Cardiothoracic Vascular Surgery)

## 2022-07-23 ENCOUNTER — Ambulatory Visit (INDEPENDENT_AMBULATORY_CARE_PROVIDER_SITE_OTHER): Payer: Self-pay | Admitting: Thoracic Surgery (Cardiothoracic Vascular Surgery)

## 2022-07-23 VITALS — BP 127/80 | HR 57 | Resp 20 | Ht 66.0 in | Wt 165.0 lb

## 2022-07-23 DIAGNOSIS — K449 Diaphragmatic hernia without obstruction or gangrene: Secondary | ICD-10-CM

## 2022-07-23 DIAGNOSIS — Z9889 Other specified postprocedural states: Secondary | ICD-10-CM

## 2022-07-23 DIAGNOSIS — Z8719 Personal history of other diseases of the digestive system: Secondary | ICD-10-CM

## 2022-07-30 NOTE — Progress Notes (Deleted)
Electrophysiology Office Follow up Visit Note:    Date:  08/02/2022   ID:  Tanner Rich, DOB December 17, 1956, MRN [id number]  PCP:  Gennette Pac, NP  [facility] Cardiologist:  Jenkins Rouge, MD  [facility] Electrophysiologist:  Vickie Epley, MD    Interval History:    Tanner Rich is a 66 y.o. male who presents for a follow up visit. I last saw him 02/22/2022 after his AF ablation. He was doing well after his ablation and takes Xarelto for stroke ppx. Since I saw him he had a redo hiatal hernia repair by Dr Kipp Brood. This was uncomplicated and he saw him for follow up 07/23/2022.        Past Medical History:  Diagnosis Date   A-fib ([place])    Complication of anesthesia    "waking up, throat closed up"- d/t cipro per patient   CVA (cerebral vascular accident) ([place]) 2000   GERD (gastroesophageal reflux disease)    History of hiatal hernia    History of kidney stones    HLD (hyperlipidemia)    HTN (hypertension)    Obesity    OSA (obstructive sleep apnea)    OSA (obstructive sleep apnea) 05/07/2015   Right carotid artery occlusion    Chronic RICA occlusion, no stenosis LICA on [id number] carotid Duplex    Past Surgical History:  Procedure Laterality Date   ATRIAL FIBRILLATION ABLATION N/A 11/16/2021   Procedure: ATRIAL FIBRILLATION ABLATION;  Surgeon: Vickie Epley, MD;  Location: [facility];  Service: Cardiovascular;  Laterality: N/A;   CARDIOVERSION N/A 06/03/2015   Procedure: CARDIOVERSION;  Surgeon: Josue Hector, MD;  Location: [facility];  Service: Cardiovascular;  Laterality: N/A;   CARDIOVERSION N/A 05/11/2017   Procedure: CARDIOVERSION;  Surgeon: Josue Hector, MD;  Location: [facility] ENDOSCOPY;  Service: Cardiovascular;  Laterality: N/A;   CARDIOVERSION N/A 04/11/2020   Procedure: CARDIOVERSION;  Surgeon: Fay Records, MD;  Location: [facility] ENDOSCOPY;  Service: Cardiovascular;  Laterality: N/A;   COLONOSCOPY     ESOPHAGOGASTRODUODENOSCOPY N/A  04/23/2021   Procedure: ESOPHAGOGASTRODUODENOSCOPY (EGD);  Surgeon: Lajuana Matte, MD;  Location: [facility] OR;  Service: Thoracic;  Laterality: N/A;   ESOPHAGOGASTRODUODENOSCOPY N/A 06/03/2022   Procedure: ESOPHAGOGASTRODUODENOSCOPY (EGD);  Surgeon: Lajuana Matte, MD;  Location: [facility];  Service: Thoracic;  Laterality: N/A;   HEMORRHOID SURGERY     2022   TONSILLECTOMY     XI ROBOTIC ASSISTED HIATAL HERNIA REPAIR N/A 06/03/2022   Procedure: XI ROBOTIC ASSISTED HIATAL HERNIA REPAIR;  Surgeon: Lajuana Matte, MD;  Location: [facility];  Service: Thoracic;  Laterality: N/A;   XI ROBOTIC ASSISTED PARAESOPHAGEAL HERNIA REPAIR N/A 04/23/2021   Procedure: XI ROBOTIC ASSISTED PARAESOPHAGEAL HERNIA REPAIR WITH FUNDOPLICATION USING ACELL [place];  Surgeon: Lajuana Matte, MD;  Location: [facility] OR;  Service: Thoracic;  Laterality: N/A;    Current Medications: Current Meds  Medication Sig   acetaminophen (TYLENOL) 500 MG tablet Take 2 tablets (1,000 mg total) by mouth every 6 (six) hours. (Patient taking differently: Take 1,000 mg by mouth every 6 (six) hours as needed for moderate pain.)   dofetilide (TIKOSYN) 250 MCG capsule Take 1 capsule (250 mcg total) by mouth 2 (two) times daily.   enalapril (VASOTEC) 20 MG tablet Take 20 mg by mouth daily.   metoprolol succinate (TOPROL XL) 25 MG 24 hr tablet Take 1 tablet (25 mg total) by mouth 2 (two) times daily.   potassium chloride (KLOR-CON) 10  MEQ tablet Take 1 tablet (10 mEq total) by mouth 2 (two) times daily.   rivaroxaban (XARELTO) 20 MG TABS tablet Take 1 tablet (20 mg total) by mouth daily.   tamsulosin (FLOMAX) 0.4 MG CAPS capsule Take 0.4 mg by mouth daily.   Vitamin D, Ergocalciferol, (DRISDOL) 1.25 MG (50000 UNIT) CAPS capsule Take 50,000 Units by mouth every 30 (thirty) days. 2 per day, pt states he didn't read the directions.     Allergies:   Ciprofloxacin   Social History   Socioeconomic History   Marital status:  Married    Spouse name: Not on file   Number of children: Not on file   Years of education: Not on file   Highest education level: Not on file  Occupational History   Not on file  Tobacco Use   Smoking status: Never   Smokeless tobacco: Never  Vaping Use   Vaping Use: Never used  Substance and Sexual Activity   Alcohol use: No    Alcohol/week: 0.0 standard drinks of alcohol   Drug use: No   Sexual activity: Not on file  Other Topics Concern   Not on file  Social History Narrative   Not on file   Social Determinants of Health   Financial Resource Strain: Not on file  Food Insecurity: Not on file  Transportation Needs: Not on file  Physical Activity: Not on file  Stress: Not on file  Social Connections: Not on file     Family History: The patient's family history includes Atrial fibrillation in his mother; Cancer in his father; Heart disease in his father; Prostate cancer in an other family member.  ROS:   Please see the history of present illness.    All other systems reviewed and are negative.  EKGs/Labs/Other Studies Reviewed:    The following studies were reviewed today:   EKG:  The ekg ordered today demonstrates sinus rhythm.  QTc is 420 ms.  She is usually  Recent Labs: 05/20/2022: Magnesium 2.2 06/02/2022: ALT 10 06/04/2022: BUN 15; Creatinine, Ser 0.90; Hemoglobin 12.1; Platelets 163; Potassium 4.5; Sodium 137  Recent Lipid Panel No results found for: "CHOL", "TRIG", "HDL", "CHOLHDL", "VLDL", "LDLCALC", "LDLDIRECT"  Physical Exam:    VS:  BP 136/88   Pulse (!) 49   Ht 5\' 6"  (1.676 m)   Wt 169 lb 12.8 oz (77 kg)   SpO2 96%   BMI 27.41 kg/m     Wt Readings from Last 3 Encounters:  08/02/22 169 lb 12.8 oz (77 kg)  07/23/22 165 lb (74.8 kg)  06/03/22 163 lb (73.9 kg)     GEN: *** Well nourished, well developed in no acute distress HEENT: Normal NECK: No JVD; No carotid bruits LYMPHATICS: No lymphadenopathy CARDIAC: ***RRR, no murmurs, rubs,  gallops RESPIRATORY:  Clear to auscultation without rales, wheezing or rhonchi  ABDOMEN: Soft, non-tender, non-distended MUSCULOSKELETAL:  No edema; No deformity  SKIN: Warm and dry NEUROLOGIC:  Alert and oriented x 3 PSYCHIATRIC:  Normal affect        ASSESSMENT:    1. Cerebrovascular disease   2. Persistent atrial fibrillation (HCC)   3. Essential hypertension   4. Encounter for long-term (current) use of high-risk medication    PLAN:    In order of problems listed above:   #Persistent AF Maintaining SR after 11/16/2021 ablation.  On dofetilide. QTc and Cr stable for ongoing use***. Repeat BMP and Mg today.  #HTN At goal.  Follow up with AF clinic in  3 months.        Total time spent with patient today *** minutes. This includes reviewing records, evaluating the patient and coordinating care.   Medication Adjustments/Labs and Tests Ordered: Current medicines are reviewed at length with the patient today.  Concerns regarding medicines are outlined above.  No orders of the defined types were placed in this encounter.  No orders of the defined types were placed in this encounter.    Signed, Steffanie Dunn, MD, [facility], [facility] 08/02/2022 8:51 AM    Electrophysiology [facility]

## 2022-08-02 ENCOUNTER — Encounter: Payer: Medicare PPO | Admitting: Cardiology

## 2022-08-02 ENCOUNTER — Encounter: Payer: Self-pay | Admitting: Cardiology

## 2022-08-02 VITALS — BP 136/88 | HR 49 | Ht 66.0 in | Wt 169.8 lb

## 2022-08-02 DIAGNOSIS — Z79899 Other long term (current) drug therapy: Secondary | ICD-10-CM

## 2022-08-02 DIAGNOSIS — I679 Cerebrovascular disease, unspecified: Secondary | ICD-10-CM

## 2022-08-02 DIAGNOSIS — I10 Essential (primary) hypertension: Secondary | ICD-10-CM

## 2022-08-02 DIAGNOSIS — I4819 Other persistent atrial fibrillation: Secondary | ICD-10-CM

## 2022-08-02 NOTE — Progress Notes (Signed)
This encounter was created in error - please disregard.

## 2022-10-14 ENCOUNTER — Ambulatory Visit (INDEPENDENT_AMBULATORY_CARE_PROVIDER_SITE_OTHER): Payer: Medicare PPO | Admitting: Thoracic Surgery (Cardiothoracic Vascular Surgery)

## 2022-10-14 DIAGNOSIS — K449 Diaphragmatic hernia without obstruction or gangrene: Secondary | ICD-10-CM

## 2022-10-14 NOTE — Progress Notes (Signed)
     [address]       [place] [address]             [phone]       Patient: Home Provider: Office Consent for Telemedicine visit obtained.  Today's visit was completed via a real-time telehealth (see specific modality noted below). The patient/authorized person provided oral consent at the time of the visit to engage in a telemedicine encounter with the present provider at [facility]. The patient/authorized person was informed of the potential benefits, limitations, and risks of telemedicine. The patient/authorized person expressed understanding that the laws that protect confidentiality also apply to telemedicine. The patient/authorized person acknowledged understanding that telemedicine does not provide emergency services and that he or she would need to call 911 or proceed to the nearest hospital for help if such a need arose.   Total time spent in the clinical discussion 10 minutes.  Telehealth Modality: Phone visit (audio only)  I had a telephone visit with Tanner Rich.  He is status post paraesophageal hernia repair.  He has no complaints.  He denies any reflux disease or dysphagia.  He is back to his normal activities.  He will follow-up as needed.

## 2022-10-22 ENCOUNTER — Ambulatory Visit: Payer: Medicare PPO | Admitting: Thoracic Surgery (Cardiothoracic Vascular Surgery)

## 2022-12-15 ENCOUNTER — Other Ambulatory Visit (HOSPITAL_COMMUNITY): Payer: Self-pay | Admitting: Physician Assistant

## 2023-01-02 ENCOUNTER — Other Ambulatory Visit: Payer: Self-pay | Admitting: Physician Assistant

## 2023-01-13 IMAGING — CT CT HEART MORPH/PULM VEIN W/ CM & W/O CA SCORE
1 series · 3 of 3 positions shown, 4 images · non-contrast
Comparison: None.
COMPARISON: None.

Addendum:
EXAM:
OVER-READ INTERPRETATION  CT CHEST

The following report is an over-read performed by radiologist Dr.
Jariart Entremeses [[email or web address]] on 03/17/2021. This
over-read does not include interpretation of cardiac or coronary
anatomy or pathology. The coronary calcium score/coronary CTA
interpretation by the cardiologist is attached.
CLINICAL DATA: Atrial fibrillation scheduled for an ablation.
Cardiac CT/CTA
TECHNIQUE: The patient was scanned on a Siemens Force [REDACTED]ice  scanner.

[Series 441: laa · 0.43mm/px · 3 of 3 slices shown, 4 images]
[im 1/3  vessel]
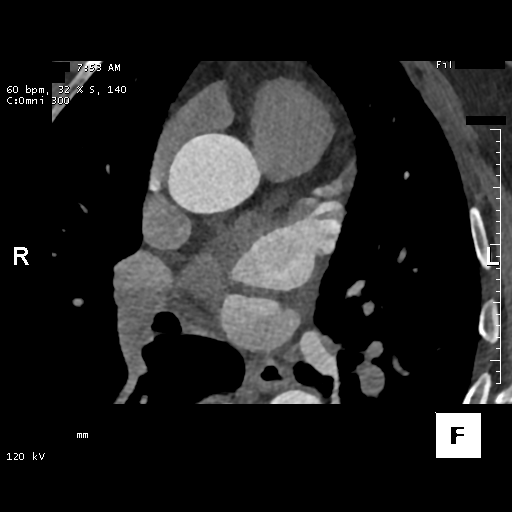
[im 1/3  lung]
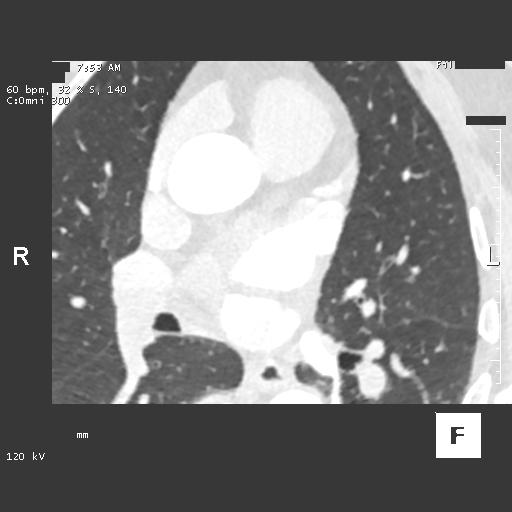
[im 2/3  vessel]
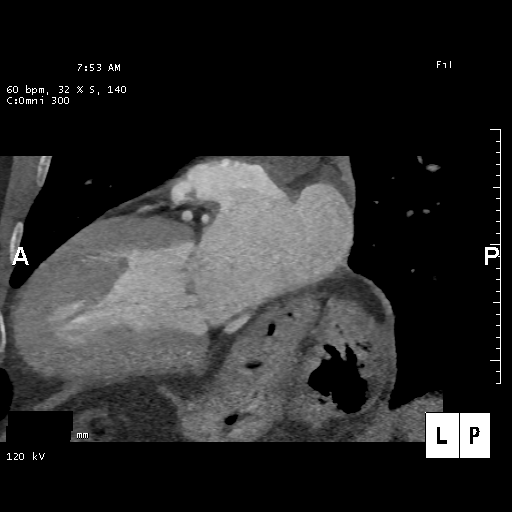
[im 3/3  vessel]
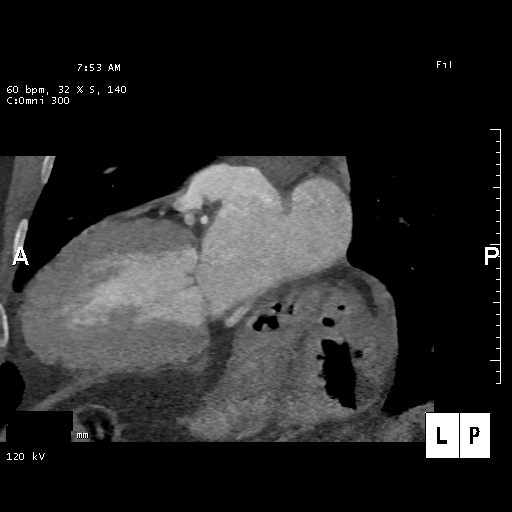

[3 of 3 positions shown; findings below may reference images not displayed]

FINDINGS: Large hiatal hernia. Within the visualized portions of the thorax
there are no suspicious appearing pulmonary nodules or masses, there
is no acute consolidative airspace disease, no pleural effusions, no
pneumothorax and no lymphadenopathy. Visualized portions of the
upper abdomen are unremarkable. There are no aggressive appearing
lytic or blastic lesions noted in the visualized portions of the
skeleton.
IMPRESSION: 1. Large hiatal hernia.
FINDINGS: A 120 kV prospective scan was triggered in the ascending thoracic
aorta at 140 HU's. Gantry rotation speed was 250 msecs and
collimation was .6 mm. No beta blockade and no NTG was given. The 3D
data set was reconstructed for best systolic and diastolic phases
along with delayed images of the ALEKSANDAR KRNJA Images analyzed on a dedicated
work station using MPR, MIP and VRT modes. The patient received 80
cc of contrast.

Moderate LAE. Normal RA. No ALEKSANDAR KRNJA thrombus. Very large hiatal hernia
impinges on the lateral wall of the LA and adjacent to the LLPV
ostium No ASD/PFO. No pericardial effusion Normal aortic root 3.7 cm

RUPV: Ostium 19.0 mm   area 2.6 cm2

RLPV: Ostium 18.9 mm area 3.1 cm2 early branching/ common ostia to
middle vein

LUPV:  Ostium 15.7 mm  area 2.2 cm2

LLPV:  Ostium 18.6 mm   area 2.4 cm2

Calcium score 56 involving LAD and mid RCA
IMPRESSION: 1.  Moderate LAE normal RA No ALEKSANDAR KRNJA thrombus

2.  Normal PV anatomy with common ostium RLPV/RMPV

3.  No ASD/PFO

4.  Normal aortic root 3.7 cm

5.  No pericardial effusion

6.  Calcium score 56 which is 41 st percentile for age / sex

7. Very large hiatal hernia that impinges on the lateral LA wall and
is adjacent to the LLPV ostium

Bhavana Conejo

*** End of Addendum ***
EXAM:
OVER-READ INTERPRETATION  CT CHEST

The following report is an over-read performed by radiologist Dr.
Jariart Entremeses [[email or web address]] on 03/17/2021. This
over-read does not include interpretation of cardiac or coronary
anatomy or pathology. The coronary calcium score/coronary CTA
interpretation by the cardiologist is attached.
FINDINGS: Large hiatal hernia. Within the visualized portions of the thorax
there are no suspicious appearing pulmonary nodules or masses, there
is no acute consolidative airspace disease, no pleural effusions, no
pneumothorax and no lymphadenopathy. Visualized portions of the
upper abdomen are unremarkable. There are no aggressive appearing
lytic or blastic lesions noted in the visualized portions of the
skeleton.
IMPRESSION: 1. Large hiatal hernia.

## 2023-02-02 ENCOUNTER — Other Ambulatory Visit: Payer: Self-pay | Admitting: Physician Assistant

## 2023-02-03 ENCOUNTER — Encounter (HOSPITAL_COMMUNITY): Payer: Self-pay | Admitting: *Deleted

## 2023-03-01 ENCOUNTER — Other Ambulatory Visit (HOSPITAL_COMMUNITY): Payer: Self-pay | Admitting: Physician Assistant

## 2023-05-27 ENCOUNTER — Other Ambulatory Visit (HOSPITAL_COMMUNITY): Payer: Self-pay | Admitting: Physician Assistant

## 2023-06-11 ENCOUNTER — Other Ambulatory Visit (HOSPITAL_COMMUNITY): Payer: Self-pay | Admitting: Physician Assistant

## 2023-07-01 ENCOUNTER — Encounter (HOSPITAL_COMMUNITY): Payer: Self-pay | Admitting: *Deleted

## 2024-10-24 ENCOUNTER — Other Ambulatory Visit (HOSPITAL_COMMUNITY): Payer: Self-pay | Admitting: *Deleted

## 2024-10-24 MED ORDER — DOFETILIDE 250 MCG PO CAPS: 250.0000 ug | ORAL_CAPSULE | Freq: Two times a day (BID) | ORAL | 1 refills | Status: DC

## 2024-11-04 ENCOUNTER — Other Ambulatory Visit (HOSPITAL_COMMUNITY): Payer: Self-pay | Admitting: Physician Assistant

## 2024-12-05 ENCOUNTER — Other Ambulatory Visit (HOSPITAL_COMMUNITY): Payer: Self-pay | Admitting: Physician Assistant

## 2024-12-07 ENCOUNTER — Ambulatory Visit (HOSPITAL_COMMUNITY)
Admission: RE | Admit: 2024-12-07 | Discharge: 2024-12-07 | Disposition: A | Source: Ambulatory Visit | Attending: Physician Assistant | Admitting: Physician Assistant

## 2024-12-07 VITALS — BP 152/100 | HR 75 | Ht 66.0 in | Wt 174.0 lb

## 2024-12-07 DIAGNOSIS — Z79899 Other long term (current) drug therapy: Secondary | ICD-10-CM | POA: Diagnosis not present

## 2024-12-07 DIAGNOSIS — Z5181 Encounter for therapeutic drug level monitoring: Secondary | ICD-10-CM | POA: Diagnosis not present

## 2024-12-07 DIAGNOSIS — I480 Paroxysmal atrial fibrillation: Secondary | ICD-10-CM

## 2024-12-07 DIAGNOSIS — D6869 Other thrombophilia: Secondary | ICD-10-CM

## 2024-12-07 DIAGNOSIS — I4891 Unspecified atrial fibrillation: Secondary | ICD-10-CM | POA: Diagnosis not present

## 2024-12-07 DIAGNOSIS — G4733 Obstructive sleep apnea (adult) (pediatric): Secondary | ICD-10-CM | POA: Diagnosis not present

## 2024-12-07 MED ORDER — DOFETILIDE 250 MCG PO CAPS: 250.0000 ug | ORAL_CAPSULE | Freq: Two times a day (BID) | ORAL | 5 refills | Status: AC

## 2024-12-07 NOTE — Progress Notes (Signed)
 Primary Care Physician: Gretta Coolidge SQUIBB, NP Referring Physician: Dr. Cindie Lynwood LELON Tanner Rich is a 68 y.o. male with a h/o persistent  afib that failed flecainide  and was referred to Dr. Cindie 01/2021,  to be considered for ablation. The imaging needed for ablation revealed a large Hiatal hernia. Ablation plans were  discontinued  due to safety concerns of burning  left atrium with HH comprising  Left atrium. Flecainide  was discontinued. The original plan was to have tikosyn  admit then [facility] repair but the timing did not work out that way. He is now around 2 weeks s/p hernia repair. He is here to discuss Tikosyn  admit. He restarted anticoagulation 4/29, after  missing one day pre op.   No benadryl  use. Qtc acceptable. Discussed price of drug. He plans to use good rx. He is rate controlled but  is bothered with fatigue and she shortness of  breath with exertion. He was seen by EP during his recent surgery and diltiazem  was increased to 360 mg daily. I did discuss with Dr. Cindie  going ahead with tikosyn  admit vrs ablation and he wants him to go ahead with tikosyn  admit for now but he will ablate down the road when he has fully  recovered from his surgery.  Follow up 05/19/21 in the AF clinic. Patient presents for dofetilide  admission. He continues to have SOB with exertion. He denies any bleeding issues on anticoagulation and has not missed any doses in the last 3 weeks.    Follow up in the AF clinic 05/28/21. Patient is s/p dofetilide  loading 5/24-5/27/22. He converted with the medication and did not require DCCV. He did have an episode of afib on 05/25/21 which lasted about 6 hours. He denies any bleeding issues on anticoagulation.   Follow up in the AF clinic 11/23/21. Patient underwent afib ablation 11/16/21 with Dr Cindie. Since 11/18/21, patient has noted any irregular heart beat associated with dizziness. His BP machine at home showed an irregular rhythm. ECG today shows aberrantly conducting  PACs in bigeminal pattern.   Follow up in the AF clinic 11/30/21. Patient reports that he is feeling much better with the medication changes. His dizziness has resolved. He is in SR today.   Follow up 12/07/24. Patient returns for follow up for atrial fibrillation and dofetilide  monitoring. He was lost to follow up since 2023. He remains in SR today. He has rare, brief palpitations. He is not on CPAP. No bleeding issues on anticoagulation.   Today, he  denies symptoms of chest pain, shortness of breath, orthopnea, PND, lower extremity edema, dizziness, presyncope, syncope, bleeding, or neurologic sequela. The patient is tolerating medications without difficulties and is otherwise without complaint today.    Past Medical History:  Diagnosis Date   A-fib (HCC)    Complication of anesthesia    waking up, throat closed up- d/t cipro per patient   CVA (cerebral vascular accident) (HCC) 2000   GERD (gastroesophageal reflux disease)    History of hiatal hernia    History of kidney stones    HLD (hyperlipidemia)    HTN (hypertension)    Obesity    OSA (obstructive sleep apnea)    OSA (obstructive sleep apnea) 05/07/2015   Right carotid artery occlusion    Chronic RICA occlusion, no stenosis LICA on 01/26/21 carotid Duplex    Current Outpatient Medications  Medication Sig Dispense Refill   acetaminophen  (TYLENOL ) 500 MG tablet Take 2 tablets (1,000 mg total) by mouth every 6 (  six) hours. (Patient taking differently: Take 1,000 mg by mouth as needed.) 30 tablet 0   dofetilide  (TIKOSYN ) 250 MCG capsule Take 1 capsule (250 mcg total) by mouth 2 (two) times daily. No further fill without appt 30 capsule 1   enalapril  (VASOTEC ) 20 MG tablet Take 20 mg by mouth daily.     metoprolol  succinate (TOPROL -XL) 25 MG 24 hr tablet Take 1 tablet (25 mg total) by mouth 2 (two) times daily. Appt req for refill [id number] 30 tablet 0   potassium chloride  (KLOR-CON ) 10 MEQ tablet TAKE 2 TABLETS BY MOUTH DAILY  180 tablet 1   tamsulosin  (FLOMAX ) 0.4 MG CAPS capsule Take 0.4 mg by mouth daily.     Vitamin D, Ergocalciferol, (DRISDOL) 1.25 MG (50000 UNIT) CAPS capsule Take 50,000 Units by mouth every 30 (thirty) days. 2 per day, pt states he didn't read the directions.     XARELTO  20 MG TABS tablet TAKE 1 TABLET BY MOUTH EVERY DAY 30 tablet 6   No current facility-administered medications for this encounter.    ROS- All systems are reviewed and negative except as per the HPI above  Physical Exam: Vitals:   12/07/24 1435  BP: (!) 152/100  Pulse: 75  Weight: 78.9 kg  Height: 5' 6 (1.676 m)    Wt Readings from Last 3 Encounters:  12/07/24 78.9 kg  08/02/22 77 kg  07/23/22 74.8 kg    GEN: Well nourished, well developed in no acute distress CARDIAC: Regular rate and rhythm with occasional ectopy, no murmurs, rubs, gallops RESPIRATORY:  Clear to auscultation without rales, wheezing or rhonchi  ABDOMEN: Soft, non-tender, non-distended EXTREMITIES:  No edema; No deformity     EKG Interpretation Date/Time:  Friday December 07 2024 14:45:05 EST Ventricular Rate:  75 PR Interval:  158 QRS Duration:  112 QT Interval:  416 QTC Calculation: 464 R Axis:   -58  Text Interpretation: Sinus rhythm with Premature ventricular complexes Pulmonary disease pattern Incomplete right bundle branch block Left anterior fascicular block Moderate voltage criteria for LVH, may be normal variant ( R in aVL , Cornell product ) Abnormal ECG When compared with ECG of 30-Nov-2021 14:55, No significant change was found Confirmed by Lyrique Hakim ([id number]) on 12/07/2024 2:52:32 PM    Echo 03/06/21 demonstrated   1. Left ventricular ejection fraction, by estimation, is 60 to 65%. The  left ventricle has normal function. The left ventricle has no regional  wall motion abnormalities. There is mild concentric left ventricular  hypertrophy. Left ventricular diastolic parameters are consistent with Grade I diastolic  dysfunction (impaired relaxation).   2. Right ventricular systolic function is normal. The right ventricular  size is normal. There is normal pulmonary artery systolic pressure. The estimated right ventricular systolic pressure is 22.2 mmHg.   3. Left atrial size was moderately dilated.   4. The mitral valve is grossly normal. Trivial mitral valve  regurgitation.   5. The aortic valve is tricuspid. Aortic valve regurgitation is not  visualized.   6. The inferior vena cava is normal in size with greater than 50%  respiratory variability, suggesting right atrial pressure of 3 mmHg.    CHA2DS2-VASc Score = 3  The patient's score is based upon: CHF History: 0 HTN History: 1 Diabetes History: 0 Stroke History: 0 Vascular Disease History: 1 Age Score: 1 Gender Score: 0       ASSESSMENT AND PLAN: Persistent Atrial Fibrillation (ICD10:  I48.19) The patient's CHA2DS2-VASc score is 3, indicating a 3.2% annual  risk of stroke.   S/p afib ablation 11/16/21. S/p dofetilide  loading 04/2021 Patient appears to be maintaining SR Continue dofetilide  250 mcg BID Continue Xarelto  20 mg daily Continue Toprol  25 mg BID  Secondary Hypercoagulable State (ICD10:  D68.69) The patient is at significant risk for stroke/thromboembolism based upon his CHA2DS2-VASc Score of 3.  Continue Rivaroxaban  (Xarelto ). No bleeding issues.   High Risk Medication Monitoring (ICD 10: [id number]) Patient requires ongoing monitoring for anti-arrhythmic medication which has the potential to cause life threatening arrhythmias. QT interval on ECG acceptable for dofetilide  monitoring. Check bmet/mag today.     OSA  Patient has not tolerated CPAP historically. He is agreeable to consultation with sleep medicine to discuss his options. Will refer.   CAD CAC score 88 on CT No anginal symptoms  HTN Elevated today. He will keep BP log at home for review. Suspect untreated OSA contributing. No changes today.    Follow up in  the AF clinic in 6 months.    Daril Kicks PA-C [facility] [address] [place], [place] [address] [phone]

## 2024-12-08 LAB — BASIC METABOLIC PANEL WITH GFR
BUN/Creatinine Ratio: 13 (ref 10–24)
BUN: 13 mg/dL (ref 8–27)
CO2: 24 mmol/L (ref 20–29)
Calcium: 9.8 mg/dL (ref 8.6–10.2)
Chloride: 104 mmol/L (ref 96–106)
Creatinine, Ser: 1 mg/dL (ref 0.76–1.27)
Glucose: 81 mg/dL (ref 70–99)
Potassium: 4.1 mmol/L (ref 3.5–5.2)
Sodium: 145 mmol/L — ABNORMAL HIGH (ref 134–144)
eGFR: 81 mL/min/1.73 (ref >59)

## 2024-12-08 LAB — MAGNESIUM: Magnesium: 2 mg/dL (ref 1.6–2.3)

## 2024-12-10 ENCOUNTER — Ambulatory Visit (HOSPITAL_COMMUNITY): Payer: Self-pay | Admitting: Physician Assistant

## 2025-06-06 ENCOUNTER — Ambulatory Visit (HOSPITAL_COMMUNITY): Admitting: Physician Assistant
